# Patient Record
Sex: Female | Born: 1975
Health system: Southern US, Community
[De-identification: ages and names within clinical notes are randomized; demographics above are authoritative.]

## PROBLEM LIST (undated history)

## (undated) DIAGNOSIS — G43909 Migraine, unspecified, not intractable, without status migrainosus: Secondary | ICD-10-CM

## (undated) DIAGNOSIS — E162 Hypoglycemia, unspecified: Secondary | ICD-10-CM

## (undated) DIAGNOSIS — F32A Depression, unspecified: Secondary | ICD-10-CM

## (undated) DIAGNOSIS — F329 Major depressive disorder, single episode, unspecified: Secondary | ICD-10-CM

## (undated) DIAGNOSIS — F419 Anxiety disorder, unspecified: Secondary | ICD-10-CM

## (undated) DIAGNOSIS — E119 Type 2 diabetes mellitus without complications: Secondary | ICD-10-CM

## (undated) DIAGNOSIS — M5126 Other intervertebral disc displacement, lumbar region: Secondary | ICD-10-CM

## (undated) DIAGNOSIS — M259 Joint disorder, unspecified: Secondary | ICD-10-CM

## (undated) DIAGNOSIS — M5136 Other intervertebral disc degeneration, lumbar region: Secondary | ICD-10-CM

## (undated) DIAGNOSIS — R55 Syncope and collapse: Secondary | ICD-10-CM

## (undated) DIAGNOSIS — E282 Polycystic ovarian syndrome: Secondary | ICD-10-CM

## (undated) DIAGNOSIS — K219 Gastro-esophageal reflux disease without esophagitis: Secondary | ICD-10-CM

## (undated) DIAGNOSIS — M51369 Other intervertebral disc degeneration, lumbar region without mention of lumbar back pain or lower extremity pain: Secondary | ICD-10-CM

## (undated) HISTORY — DX: Joint disorder, unspecified: M25.9

## (undated) HISTORY — DX: Polycystic ovarian syndrome: E28.2

## (undated) HISTORY — DX: Depression, unspecified: F32.A

## (undated) HISTORY — DX: Type 2 diabetes mellitus without complications: E11.9

## (undated) HISTORY — DX: Syncope and collapse: R55

## (undated) HISTORY — DX: Other intervertebral disc degeneration, lumbar region without mention of lumbar back pain or lower extremity pain: M51.369

## (undated) HISTORY — DX: Gastro-esophageal reflux disease without esophagitis: K21.9

## (undated) HISTORY — DX: Hypoglycemia, unspecified: E16.2

## (undated) HISTORY — DX: Migraine, unspecified, not intractable, without status migrainosus: G43.909

## (undated) HISTORY — DX: Major depressive disorder, single episode, unspecified: F32.9

## (undated) HISTORY — DX: Other intervertebral disc degeneration, lumbar region: M51.36

## (undated) HISTORY — PX: DENTAL SURGERY: SHX609

## (undated) HISTORY — PX: OTHER SURGICAL HISTORY: SHX169

## (undated) HISTORY — DX: Other intervertebral disc displacement, lumbar region: M51.26

---

## 1998-05-26 ENCOUNTER — Emergency Department (HOSPITAL_COMMUNITY): Admission: EM | Admit: 1998-05-26 | Discharge: 1998-05-26 | Payer: Self-pay | Admitting: Emergency Medicine

## 1998-06-13 ENCOUNTER — Inpatient Hospital Stay (HOSPITAL_COMMUNITY): Admission: EM | Admit: 1998-06-13 | Discharge: 1998-06-15 | Payer: Self-pay | Admitting: Emergency Medicine

## 1998-06-13 ENCOUNTER — Encounter: Payer: Self-pay | Admitting: *Deleted

## 1998-08-23 ENCOUNTER — Emergency Department (HOSPITAL_COMMUNITY): Admission: EM | Admit: 1998-08-23 | Discharge: 1998-08-23 | Payer: Self-pay | Admitting: Emergency Medicine

## 1998-08-23 ENCOUNTER — Inpatient Hospital Stay (HOSPITAL_COMMUNITY): Admission: AD | Admit: 1998-08-23 | Discharge: 1998-08-23 | Payer: Self-pay | Admitting: Obstetrics

## 2000-06-25 ENCOUNTER — Encounter: Payer: Self-pay | Admitting: Emergency Medicine

## 2000-06-25 ENCOUNTER — Emergency Department (HOSPITAL_COMMUNITY): Admission: EM | Admit: 2000-06-25 | Discharge: 2000-06-25 | Payer: Self-pay | Admitting: Emergency Medicine

## 2000-08-04 ENCOUNTER — Other Ambulatory Visit: Admission: RE | Admit: 2000-08-04 | Discharge: 2000-08-04 | Payer: Self-pay | Admitting: Obstetrics and Gynecology

## 2004-09-12 ENCOUNTER — Emergency Department (HOSPITAL_COMMUNITY): Admission: EM | Admit: 2004-09-12 | Discharge: 2004-09-13 | Payer: Self-pay

## 2004-12-19 ENCOUNTER — Emergency Department (HOSPITAL_COMMUNITY): Admission: EM | Admit: 2004-12-19 | Discharge: 2004-12-19 | Payer: Self-pay | Admitting: Emergency Medicine

## 2008-12-15 ENCOUNTER — Emergency Department (HOSPITAL_COMMUNITY): Admission: EM | Admit: 2008-12-15 | Discharge: 2008-12-16 | Payer: Self-pay | Admitting: Emergency Medicine

## 2009-04-02 ENCOUNTER — Emergency Department (HOSPITAL_COMMUNITY): Admission: EM | Admit: 2009-04-02 | Discharge: 2009-04-02 | Payer: Self-pay | Admitting: Emergency Medicine

## 2010-02-13 ENCOUNTER — Emergency Department (HOSPITAL_COMMUNITY): Admission: EM | Admit: 2010-02-13 | Discharge: 2010-02-13 | Payer: Self-pay | Admitting: Emergency Medicine

## 2010-05-03 ENCOUNTER — Emergency Department (HOSPITAL_COMMUNITY): Admission: EM | Admit: 2010-05-03 | Discharge: 2010-05-03 | Payer: Self-pay | Admitting: Emergency Medicine

## 2010-12-03 LAB — BASIC METABOLIC PANEL
CO2: 25 mEq/L (ref 19–32)
Calcium: 8.5 mg/dL (ref 8.4–10.5)
Chloride: 101 mEq/L (ref 96–112)
GFR calc Af Amer: >60 mL/min (ref >60)
Glucose, Bld: 111 mg/dL — ABNORMAL HIGH (ref 70–99)
Potassium: 3.7 mEq/L (ref 3.5–5.1)
Sodium: 131 mEq/L — ABNORMAL LOW (ref 135–145)

## 2010-12-03 LAB — DIFFERENTIAL
Basophils Absolute: 0.1 10*3/uL (ref 0.0–0.1)
Basophils Relative: 1 % (ref 0–1)
Eosinophils Absolute: 0.2 10*3/uL (ref 0.0–0.7)
Eosinophils Relative: 2 % (ref 0–5)
Monocytes Absolute: 1 10*3/uL (ref 0.1–1.0)
Monocytes Relative: 8 % (ref 3–12)
Neutro Abs: 8.1 10*3/uL — ABNORMAL HIGH (ref 1.7–7.7)

## 2010-12-03 LAB — POCT I-STAT, CHEM 8
Calcium, Ion: 1.15 mmol/L (ref 1.12–1.32)
Chloride: 105 mEq/L (ref 96–112)
Glucose, Bld: 103 mg/dL — ABNORMAL HIGH (ref 70–99)
HCT: 39 % (ref 36.0–46.0)
TCO2: 24 mmol/L (ref 0–100)

## 2010-12-03 LAB — CBC
HCT: 35.8 % — ABNORMAL LOW (ref 36.0–46.0)
Hemoglobin: 12.3 g/dL (ref 12.0–15.0)
MCHC: 34.3 g/dL (ref 30.0–36.0)
MCV: 85.6 fL (ref 78.0–100.0)
RBC: 4.18 MIL/uL (ref 3.87–5.11)
RDW: 13.3 % (ref 11.5–15.5)

## 2010-12-26 LAB — POCT CARDIAC MARKERS
CKMB, poc: <1 ng/mL — ABNORMAL LOW (ref 1.0–8.0)
Myoglobin, poc: 56.6 ng/mL (ref 12–200)

## 2010-12-26 LAB — BASIC METABOLIC PANEL
BUN: 8 mg/dL (ref 6–23)
Calcium: 8.7 mg/dL (ref 8.4–10.5)
Chloride: 104 mEq/L (ref 96–112)
Creatinine, Ser: 0.83 mg/dL (ref 0.4–1.2)
GFR calc Af Amer: >60 mL/min (ref >60)

## 2010-12-26 LAB — DIFFERENTIAL
Basophils Relative: 1 % (ref 0–1)
Eosinophils Absolute: 0.1 10*3/uL (ref 0.0–0.7)
Neutro Abs: 10.4 10*3/uL — ABNORMAL HIGH (ref 1.7–7.7)
Neutrophils Relative %: 75 % (ref 43–77)

## 2010-12-26 LAB — CBC
MCHC: 34.7 g/dL (ref 30.0–36.0)
MCV: 83.8 fL (ref 78.0–100.0)
Platelets: 328 10*3/uL (ref 150–400)
WBC: 13.8 10*3/uL — ABNORMAL HIGH (ref 4.0–10.5)

## 2011-01-17 ENCOUNTER — Emergency Department (HOSPITAL_COMMUNITY)
Admission: EM | Admit: 2011-01-17 | Discharge: 2011-01-18 | Discharge status: Against Medical Advice | Payer: BC Managed Care – PPO | Attending: Emergency Medicine | Admitting: Emergency Medicine

## 2011-01-17 DIAGNOSIS — R5383 Other fatigue: Secondary | ICD-10-CM | POA: Insufficient documentation

## 2011-01-17 DIAGNOSIS — R5381 Other malaise: Secondary | ICD-10-CM | POA: Insufficient documentation

## 2011-02-17 ENCOUNTER — Emergency Department (HOSPITAL_COMMUNITY)
Admission: EM | Admit: 2011-02-17 | Discharge: 2011-02-17 | Disposition: A | Discharge status: Home / Self Care | Payer: BC Managed Care – PPO | Attending: Emergency Medicine | Admitting: Emergency Medicine

## 2011-02-17 DIAGNOSIS — R0789 Other chest pain: Secondary | ICD-10-CM | POA: Insufficient documentation

## 2011-02-17 DIAGNOSIS — R05 Cough: Secondary | ICD-10-CM | POA: Insufficient documentation

## 2011-02-17 DIAGNOSIS — R0609 Other forms of dyspnea: Secondary | ICD-10-CM | POA: Insufficient documentation

## 2011-02-17 DIAGNOSIS — R059 Cough, unspecified: Secondary | ICD-10-CM | POA: Insufficient documentation

## 2011-02-17 DIAGNOSIS — R0602 Shortness of breath: Secondary | ICD-10-CM | POA: Insufficient documentation

## 2011-02-17 DIAGNOSIS — R0989 Other specified symptoms and signs involving the circulatory and respiratory systems: Secondary | ICD-10-CM | POA: Insufficient documentation

## 2011-02-17 DIAGNOSIS — J45909 Unspecified asthma, uncomplicated: Secondary | ICD-10-CM | POA: Insufficient documentation

## 2011-02-17 DIAGNOSIS — R0682 Tachypnea, not elsewhere classified: Secondary | ICD-10-CM | POA: Insufficient documentation

## 2011-03-28 ENCOUNTER — Ambulatory Visit: Payer: BC Managed Care – PPO | Admitting: Family Medicine

## 2011-04-11 ENCOUNTER — Encounter: Payer: Self-pay | Admitting: Family Medicine

## 2011-04-11 ENCOUNTER — Ambulatory Visit (INDEPENDENT_AMBULATORY_CARE_PROVIDER_SITE_OTHER): Payer: BC Managed Care – PPO | Admitting: Family Medicine

## 2011-04-11 DIAGNOSIS — E282 Polycystic ovarian syndrome: Secondary | ICD-10-CM | POA: Insufficient documentation

## 2011-04-11 DIAGNOSIS — G43909 Migraine, unspecified, not intractable, without status migrainosus: Secondary | ICD-10-CM | POA: Insufficient documentation

## 2011-04-11 DIAGNOSIS — R319 Hematuria, unspecified: Secondary | ICD-10-CM

## 2011-04-11 LAB — POCT URINALYSIS DIPSTICK
Bilirubin, UA: NEGATIVE
Nitrite, UA: NEGATIVE
pH, UA: 7

## 2011-04-11 NOTE — Assessment & Plan Note (Signed)
con't meds Refer to Neurology---pt will need to get records from previous neuro

## 2011-04-11 NOTE — Assessment & Plan Note (Signed)
Check labs  Check US  

## 2011-04-11 NOTE — Progress Notes (Signed)
  Subjective:    Patient ID: Stacy Gentry, female    DOB: September 11, 1976, 35 y.o.   MRN: [id number]  HPI pt here to establish.  She needs a referral to an endo secondary to headaches believed to be hormonal per neuro [place].  Pt was also dx with PCOS by gyn but has received no tx yet.      Review of Systems as above   Objective:   Physical Exam  Constitutional: She is oriented to person, place, and time. She appears well-developed and well-nourished.  HENT:  Head: Normocephalic and atraumatic.  Right Ear: External ear normal.  Left Ear: External ear normal.  Nose: Nose normal.  Mouth/Throat: Oropharynx is clear and moist. No oropharyngeal exudate.  Neck: Normal range of motion. Neck supple.  Cardiovascular: Normal rate and regular rhythm.   Murmur heard. Musculoskeletal: Normal range of motion.  Lymphadenopathy:    She has no cervical adenopathy.  Neurological: She is alert and oriented to person, place, and time.  Psychiatric: She has a normal mood and affect. Her behavior is normal. Judgment and thought content normal.          Assessment & Plan:

## 2011-04-12 LAB — HEPATIC FUNCTION PANEL
AST: 24 U/L (ref 0–37)
Albumin: 4.3 g/dL (ref 3.5–5.2)

## 2011-04-12 LAB — CBC WITH DIFFERENTIAL/PLATELET
Basophils Absolute: 0 10*3/uL (ref 0.0–0.1)
Eosinophils Absolute: 0.2 10*3/uL (ref 0.0–0.7)
Eosinophils Relative: 3.1 % (ref 0.0–5.0)
HCT: 36.1 % (ref 36.0–46.0)
Lymphs Abs: 2.7 10*3/uL (ref 0.7–4.0)
MCHC: 33.2 g/dL (ref 30.0–36.0)
MCV: 84.5 fl (ref 78.0–100.0)
Monocytes Absolute: 0.7 10*3/uL (ref 0.1–1.0)
Platelets: 350 10*3/uL (ref 150.0–400.0)
RDW: 13.5 % (ref 11.5–14.6)

## 2011-04-12 LAB — BASIC METABOLIC PANEL
BUN: 6 mg/dL (ref 6–23)
CO2: 26 mEq/L (ref 19–32)
Chloride: 103 mEq/L (ref 96–112)
Glucose, Bld: 138 mg/dL — ABNORMAL HIGH (ref 70–99)
Potassium: 3.9 mEq/L (ref 3.5–5.1)

## 2011-04-12 LAB — TESTOSTERONE, FREE, TOTAL, SHBG: Testosterone: 60.51 ng/dL (ref 10–70)

## 2011-04-12 LAB — HEMOGLOBIN A1C: Hgb A1c MFr Bld: 5.8 % (ref 4.6–6.5)

## 2011-04-15 LAB — URINE CULTURE: Colony Count: >=100000

## 2011-04-17 ENCOUNTER — Telehealth: Payer: Self-pay | Admitting: *Deleted

## 2011-04-17 MED ORDER — CIPROFLOXACIN HCL 500 MG PO TABS: 500.0000 mg | ORAL_TABLET | Freq: Two times a day (BID) | ORAL | Status: AC

## 2011-04-17 NOTE — Telephone Encounter (Addendum)
Discuss with patient, Rx sent in to pharmacy.   Message copied by Verdene Rio on Wed Apr 17, 2011  4:19 PM ------      Message from: Lelon Perla      Created: Tue Apr 16, 2011 10:07 AM       + UTI---- cipro 500 mg 1 po bid for 5 days

## 2011-04-18 ENCOUNTER — Other Ambulatory Visit (HOSPITAL_COMMUNITY): Payer: BC Managed Care – PPO

## 2011-04-19 ENCOUNTER — Other Ambulatory Visit (HOSPITAL_COMMUNITY): Payer: BC Managed Care – PPO

## 2011-04-19 ENCOUNTER — Other Ambulatory Visit: Payer: Self-pay | Admitting: Family Medicine

## 2011-04-19 ENCOUNTER — Ambulatory Visit (HOSPITAL_COMMUNITY)
Admission: RE | Admit: 2011-04-19 | Discharge: 2011-04-19 | Disposition: A | Discharge status: Home / Self Care | Payer: BC Managed Care – PPO | Source: Ambulatory Visit | Attending: Family Medicine | Admitting: Family Medicine

## 2011-04-19 ENCOUNTER — Telehealth: Payer: Self-pay

## 2011-04-19 DIAGNOSIS — E282 Polycystic ovarian syndrome: Secondary | ICD-10-CM

## 2011-04-19 DIAGNOSIS — D219 Benign neoplasm of connective and other soft tissue, unspecified: Secondary | ICD-10-CM

## 2011-04-19 NOTE — Telephone Encounter (Signed)
Irregular periods with fibroid Refer to gyn     Discussed with patient and she voiced understanding.    Ref put in        [place]

## 2011-04-25 ENCOUNTER — Ambulatory Visit (INDEPENDENT_AMBULATORY_CARE_PROVIDER_SITE_OTHER): Payer: BC Managed Care – PPO | Admitting: Gynecology

## 2011-04-25 ENCOUNTER — Other Ambulatory Visit (HOSPITAL_COMMUNITY)
Admission: RE | Admit: 2011-04-25 | Discharge: 2011-04-25 | Disposition: A | Discharge status: Home / Self Care | Payer: BC Managed Care – PPO | Source: Ambulatory Visit | Attending: Gynecology | Admitting: Gynecology

## 2011-04-25 ENCOUNTER — Encounter: Payer: Self-pay | Admitting: Gynecology

## 2011-04-25 VITALS — BP 124/78 | Ht 63.5 in | Wt 239.0 lb

## 2011-04-25 DIAGNOSIS — Z01419 Encounter for gynecological examination (general) (routine) without abnormal findings: Secondary | ICD-10-CM | POA: Insufficient documentation

## 2011-04-25 DIAGNOSIS — N926 Irregular menstruation, unspecified: Secondary | ICD-10-CM

## 2011-04-25 DIAGNOSIS — E282 Polycystic ovarian syndrome: Secondary | ICD-10-CM

## 2011-04-25 DIAGNOSIS — D259 Leiomyoma of uterus, unspecified: Secondary | ICD-10-CM

## 2011-04-25 MED ORDER — NORETHIN-ETH ESTRAD-FE BIPHAS 1 MG-10 MCG / 10 MCG PO TABS: 1.0000 | ORAL_TABLET | Freq: Every day | ORAL | Status: DC

## 2011-04-25 NOTE — Progress Notes (Addendum)
Stacy Gentry 1976/06/09 [id number]        35 y.o.  Presents in consultation from Dr. Loreen Freud in reference to irregular menses, leiomyoma, PCOS history. She normally sees Dr. Laury Axon for routine health maintenance and recently had blood studies that showed a mildly elevated glucose but normal hemoglobin A1c, testosterone in the 60 range normal CBC and ultrasound which showed endometrial echo of 11 mm, normal right and left ovaries without description of polycystic changes and 2 small myomas in the 2-3 cm range.  Patient notes that her menses are normally once a month but they oscillate between heavy one month and liked the next month. No intermenstrual bleeding. She also complains of difficulty losing weight and hirsutism primarily on her chin. She has not sexually active.  She is actively being followed for migraines with aura.   Past medical history,surgical history, allergies, family history and social history were all reviewed and documented in the EPIC chart. ROS:  Was performed and pertinent positives and negatives are included in the history.  Exam: directed towards her complaint chaperone present Filed Vitals:   04/25/11 1055  BP: 124/78   General appearance  Normal Skin grossly normal with terminal hair growth along the chin and upper neck. No significant sideburns Head/Neck normal with no cervical or supraclavicular adenopathy thyroid normal Abdominal  soft, nontender, without masses, organomegaly or hernia Breasts  examined lying and sitting without masses, retractions, discharge or axillary adenopathy.  No chest hair growth Pelvic  Ext/BUS/vagina  normal mild menses staining  Cervix  normal  Pap done  Uterus  anteverted, normal size, shape and contour, midline and mobile nontender   Adnexa  Without masses or tenderness    Anus and perineum  normal   Rectovaginal  normal sphincter tone without palpated masses or tenderness.    Assessment/Plan:  35 y.o. female history  of PCOS by prior physician diagnosis based on her weight and some menstrual irregularity.  Recently saw Dr. Laury Axon had upper limits of normal range testosterone at 60.  Mildly elevated glucose but normal hemoglobin A1c. I did not see lipid profile. I discussed with patient the issues of PCOS, metabolic syndrome and the need to be following her lipid profile, glucose and blood pressure. The need to try to limit weight through exercise and diet was discussed with her possibility such as metformin was also reviewed. She does have regular menses timing but they're irregular in the flow rate from month to month. She is not interested in getting pregnant at present. She does have some chin hirsutism. Would check baseline labs in addition to the testosterone that was done at 17 hydroxyprogesterone as well as DHEAS and prolactin level. Options for management were reviewed and I think from a menstrual regulation in an androgen suppressive standpoint low-dose oral contraceptives would be her best choice. I did review with her her history of migraines with aura and the increased risk of stroke associated with birth control pill use as well as the other risks such as DVT heart attack risk. She does seem to get worse migraines with her menses. After lengthy discussion and her clear understanding of the risks she wants to go ahead and start low-dose oral contraceptives I think we'll start her on lo lo Estrin to minimize estrogen exposure as well as to extend the estrogen into her menses week and hopefully abort her menstrual migraines. She has she has an appointment with her neurologist the beginning of next week and she will discuss starting  the pills with them just to make sure they are comfortable with this. She is on her menses now so she will Sunday start her pills this week and I gave her 2 sample packs to start and I prescribed a year supply to her pharmacy. I asked her to call me in 2 months in followup to see how she's  doing on the pills and then otherwise Sumie she does well with this, she'll see me in a year.  We discussed her leiomyoma both of which are small subserosal and I do not think they're contributing to her symptoms. We will plan observation at present with no special followup unless she develops worsening menses or an abnormal followup exam.  Self breast exams on a monthly basis discussed encouraged. She does have a history of breast cancer in her mother in the mid 60s and I recommended that she get her baseline at age 48 to be next year and she agrees with this. I did a Pap smear today as she reports none done through Dr. Ernst Spell office.  No blood work was done other than the above labs she'll continue to see Dr. Laury Axon for routine health maintenance and again I discussed with the patient the need to follow her glucose her lipid profile blood pressure and attempt to lose weight as best she can.   Dara Lords MD, 11:29 AM 04/25/2011

## 2011-05-07 ENCOUNTER — Other Ambulatory Visit: Payer: Self-pay | Admitting: Gynecology

## 2011-05-07 ENCOUNTER — Telehealth: Payer: Self-pay | Admitting: *Deleted

## 2011-05-07 DIAGNOSIS — N926 Irregular menstruation, unspecified: Secondary | ICD-10-CM

## 2011-05-07 NOTE — Telephone Encounter (Signed)
Message copied by Aura Camps on Tue May 07, 2011  2:37 PM ------      Message from: Ether Griffins      Created: Tue May 07, 2011 11:22 AM      Regarding: CALL PT FOR LABS (FUTURE ORDERS)       [name],            Please call this patient back to come for labs. She never came by and Dr. Audie Box has requested these tests. The orders are already in the computer under future orders. Thanks!            Rose E.

## 2011-05-07 NOTE — Telephone Encounter (Signed)
SPOKE WITH PT AND SHE WILL CALL ONCE BACK IN TOWN. DIDN'T WANT TO MAKE APPOINTMENT NOW BECAUSE SHE NEED TO CHECK HER SCHEDULE.

## 2011-05-07 NOTE — Progress Notes (Signed)
Addended by: Landis Martins R on: 05/07/2011 11:26 AM   Modules accepted: Orders

## 2011-05-13 ENCOUNTER — Encounter: Payer: Self-pay | Admitting: Family Medicine

## 2011-05-13 ENCOUNTER — Ambulatory Visit (INDEPENDENT_AMBULATORY_CARE_PROVIDER_SITE_OTHER): Payer: BC Managed Care – PPO | Admitting: Family Medicine

## 2011-05-13 DIAGNOSIS — D219 Benign neoplasm of connective and other soft tissue, unspecified: Secondary | ICD-10-CM

## 2011-05-13 DIAGNOSIS — R0989 Other specified symptoms and signs involving the circulatory and respiratory systems: Secondary | ICD-10-CM

## 2011-05-13 DIAGNOSIS — R011 Cardiac murmur, unspecified: Secondary | ICD-10-CM

## 2011-05-13 DIAGNOSIS — G43909 Migraine, unspecified, not intractable, without status migrainosus: Secondary | ICD-10-CM

## 2011-05-13 DIAGNOSIS — D259 Leiomyoma of uterus, unspecified: Secondary | ICD-10-CM

## 2011-05-13 NOTE — Progress Notes (Signed)
  Subjective:    Patient ID: Stacy Gentry, female    DOB: 1976/07/08, 35 y.o.   MRN: [id number]  HPI Pt here to f/u migraines.  She was started on topamax and is doing well.  Pt saw Dr Audie Box for fibroids and is being treated with bcp.   Pt is also getting a sleep study through neuro.  No other complaints.   Review of Systems as above   Objective:   Physical Exam  Constitutional: She is oriented to person, place, and time. She appears well-developed and well-nourished.  Cardiovascular: Normal rate and regular rhythm.   Murmur heard. Pulmonary/Chest: Breath sounds normal. No respiratory distress. She has no wheezes. She has no rales. She exhibits no tenderness.  Musculoskeletal: She exhibits no edema.  Neurological: She is alert and oriented to person, place, and time.  Psychiatric: She has a normal mood and affect. Her behavior is normal. Judgment and thought content normal.          Assessment & Plan:  1. Migraines---per neuro  2.  Fibroids-----pt does not have pcos---per gyn  3.  New murmur ---check echo rto 6 months or sooner prn

## 2011-05-17 ENCOUNTER — Other Ambulatory Visit (HOSPITAL_COMMUNITY): Payer: BC Managed Care – PPO | Admitting: Radiology

## 2011-05-27 ENCOUNTER — Other Ambulatory Visit (HOSPITAL_COMMUNITY): Payer: BC Managed Care – PPO | Admitting: Radiology

## 2011-05-31 ENCOUNTER — Telehealth: Payer: Self-pay

## 2011-05-31 NOTE — Telephone Encounter (Signed)
She has a new murmur---that is worrisome---no one has ever heard it before.   As long as she is not having any other symptoms we can wait some---but she needs to call and cancel , not just not show.

## 2011-05-31 NOTE — Telephone Encounter (Signed)
Detailed message left making patient aware of Dr.Lownes recommendations and I have advised patient to call back if she wants to discuss    KP

## 2011-05-31 NOTE — Telephone Encounter (Signed)
Dr.Lowne please see note below      KP

## 2011-05-31 NOTE — Telephone Encounter (Signed)
Left mssg to call the office       KP

## 2011-05-31 NOTE — Telephone Encounter (Signed)
Message copied by Arnette Norris on Fri May 31, 2011  9:42 AM ------      Message from: Pricilla Holm      Created: Fri May 31, 2011  9:23 AM      Regarding: echo        Pt now showed on 9/10 for echo  -lowne

## 2011-05-31 NOTE — Telephone Encounter (Signed)
Pt states that she contacted her insurance company [employer] and found out that she hasn't met her deductable and would be stuck with a $500+bill. Pt states she will just have to wait on this and doesn't wish to reschedule at this time.

## 2011-06-11 ENCOUNTER — Ambulatory Visit (HOSPITAL_COMMUNITY): Payer: BC Managed Care – PPO | Attending: Cardiology | Admitting: Radiology

## 2011-06-11 DIAGNOSIS — R002 Palpitations: Secondary | ICD-10-CM | POA: Insufficient documentation

## 2011-06-11 DIAGNOSIS — E669 Obesity, unspecified: Secondary | ICD-10-CM | POA: Insufficient documentation

## 2011-06-11 DIAGNOSIS — R011 Cardiac murmur, unspecified: Secondary | ICD-10-CM

## 2011-06-13 ENCOUNTER — Telehealth: Payer: Self-pay

## 2011-06-13 DIAGNOSIS — I5189 Other ill-defined heart diseases: Secondary | ICD-10-CM

## 2011-06-13 NOTE — Telephone Encounter (Signed)
Made patient aware of results and she requested the Cardiology ref.  --ref put in and copy mailed to patient     KP

## 2011-06-13 NOTE — Telephone Encounter (Signed)
Message copied by Arnette Norris on Thu Jun 13, 2011 10:49 AM ------      Message from: Lelon Perla      Created: Wed Jun 12, 2011  2:18 PM       Mild diastolic dysfunction      If palpitations con't --refer to cards

## 2011-06-26 ENCOUNTER — Telehealth: Payer: Self-pay | Admitting: Cardiology

## 2011-07-04 ENCOUNTER — Institutional Professional Consult (permissible substitution): Payer: BC Managed Care – PPO | Admitting: Cardiology

## 2011-07-24 ENCOUNTER — Encounter: Payer: Self-pay | Admitting: *Deleted

## 2011-07-25 ENCOUNTER — Encounter (INDEPENDENT_AMBULATORY_CARE_PROVIDER_SITE_OTHER): Payer: BC Managed Care – PPO

## 2011-07-25 ENCOUNTER — Ambulatory Visit (INDEPENDENT_AMBULATORY_CARE_PROVIDER_SITE_OTHER): Payer: BC Managed Care – PPO | Admitting: Cardiology

## 2011-07-25 ENCOUNTER — Encounter: Payer: Self-pay | Admitting: Cardiology

## 2011-07-25 DIAGNOSIS — I471 Supraventricular tachycardia: Secondary | ICD-10-CM

## 2011-07-25 DIAGNOSIS — R0600 Dyspnea, unspecified: Secondary | ICD-10-CM

## 2011-07-25 DIAGNOSIS — R000 Tachycardia, unspecified: Secondary | ICD-10-CM | POA: Insufficient documentation

## 2011-07-25 DIAGNOSIS — I10 Essential (primary) hypertension: Secondary | ICD-10-CM | POA: Insufficient documentation

## 2011-07-25 DIAGNOSIS — R0609 Other forms of dyspnea: Secondary | ICD-10-CM

## 2011-07-25 DIAGNOSIS — R0989 Other specified symptoms and signs involving the circulatory and respiratory systems: Secondary | ICD-10-CM

## 2011-07-25 DIAGNOSIS — E669 Obesity, unspecified: Secondary | ICD-10-CM

## 2011-07-25 NOTE — Assessment & Plan Note (Addendum)
BP Readings from Last 3 Encounters:  07/25/11 138/101  05/13/11 120/76  04/25/11 124/78   We will watch her BP as today's reading is the hightest.

## 2011-07-25 NOTE — Assessment & Plan Note (Signed)
The patient understands the need to lose weight with diet and exercise. We have discussed specific strategies for this.  

## 2011-07-25 NOTE — Assessment & Plan Note (Signed)
I do note that her last TSH in July was at the very lower limits of normal. I will repeat this with a T3-T4. I will place a 24-hour Holter monitor to make sure she has normal sleep/wake variation.  Further testing will be based on these results.

## 2011-07-25 NOTE — Assessment & Plan Note (Signed)
I will bring the patient back for a POET (Plain Old Exercise Test). This will allow me to screen for obstructive coronary disease, risk stratify and very importantly provide a prescription for exercise.  I will also check a BNP.

## 2011-07-25 NOTE — Patient Instructions (Signed)
Your physician has recommended that you wear a holter monitor. Holter monitors are medical devices that record the heart's electrical activity. Doctors most often use these monitors to diagnose arrhythmias. Arrhythmias are problems with the speed or rhythm of the heartbeat. The monitor is a small, portable device. You can wear one while you do your normal daily activities. This is usually used to diagnose what is causing palpitations/syncope (passing out).  This should be scheduled and completed within the next ten days.  Your physician has requested that you have an exercise tolerance test. For further information please visit [email or web address] Please also follow instruction sheet, as given.  Please have blood work done today.  TSH, T3 and T4  Continue all medications as listed.

## 2011-07-25 NOTE — Progress Notes (Signed)
HPI The patient presents for evaluation of tachycardia. She was recently noted to have this and was complaining of dyspnea. She also was found to have a heart murmur. Echocardiography was done in September which demonstrated well preserved ejection fraction with no significant valvular abnormalities. However, the patient continues to have dyspnea. This is slowly progressive. She lives on the second floor and finds that she has shortness of breath walking up a flight of stairs areas she gets into her apartment and has to rest. She sleeps on 3 pillows which has been chronic. She does have some throat tightness with this activity. She'll take an albuterol MDI and her symptoms will abate after 5 or 10 minutes. She did have a sleep study because she thought she didn't sleep well but this was negative. However, she was concerned because she didn't sleep during the test. She has had a 40 pound weight gain in about the last year or with increased intake and decreased exercise. She has a past history of hypoglycemia and syncope but no other cardiac symptoms.  Allergies  Allergen Reactions  . Hydrocodone Itching  . Oxycodone Itching    Current Outpatient Prescriptions  Medication Sig Dispense Refill  . albuterol (PROVENTIL HFA) 108 (90 BASE) MCG/ACT inhaler Inhale 2 puffs into the lungs every 6 (six) hours as needed.        . cetirizine (ZYRTEC) 10 MG tablet Take 10 mg by mouth daily.        . Norethindrone-Ethinyl Estradiol-Fe Biphas (LO LOESTRIN FE) 1 MG-10 MCG / 10 MCG tablet Take 1 tablet by mouth daily.  1 Package  11  . promethazine (PHENERGAN) 25 MG tablet every 6 (six) hours as needed.       . SUMAtriptan Succinate (IMITREX IJ) Inject 6 mg as directed 2 (two) times a week.        . topiramate (TOPAMAX) 25 MG tablet Take 75 mg by mouth daily.          Past Medical History  Diagnosis Date  . Asthma   . Depression   . Syncope   . Migraines     Dr.Paul Daphine Deutscher in [place]  . PCOS (polycystic ovarian  syndrome)     [facility]    No past surgical history on file.  Family History  Problem Relation Age of Onset  . Breast cancer Mother 10  . Breast cancer  41    Maternal Cousin  . Diabetes Mother   . Diabetes Maternal Grandmother   . Diabetes Maternal Aunt   . Arthritis Maternal Grandmother   . Arthritis Mother   . Hyperlipidemia Paternal Aunt   . Hyperlipidemia Paternal Uncle   . Heart disease Paternal Uncle   . Stroke Paternal Uncle   . Hypertension    . Kidney disease Paternal Uncle   . Depression      History   Social History  . Marital Status: Single    Spouse Name: N/A    Number of Children: N/A  . Years of Education: N/A   Occupational History  . Not on file.   Social History Main Topics  . Smoking status: Never Smoker   . Smokeless tobacco: Never Used  . Alcohol Use: No  . Drug Use: No  . Sexually Active: No   Other Topics Concern  . Not on file   Social History Narrative  . No narrative on file    ROS:  Positive for dizziness, headaches. Otherwise as stated in the history of  present illness negative for all other systems.  PHYSICAL EXAM BP 138/101  Pulse 130  Ht 5\' 4"  (1.626 m)  Wt 233 lb (105.688 kg)  BMI 39.99 kg/m2 GENERAL:  Well appearing HEENT:  Pupils equal round and reactive, fundi not visualized, oral mucosa unremarkable NECK:  No jugular venous distention, waveform within normal limits, carotid upstroke brisk and symmetric, no bruits, no thyromegaly LYMPHATICS:  No cervical, inguinal adenopathy LUNGS:  Clear to auscultation bilaterally BACK:  No CVA tenderness CHEST:  Unremarkable HEART:  PMI not displaced or sustained,S1 and S2 within normal limits, no S3, no S4, no clicks, no rubs, no murmurs ABD:  Flat, positive bowel sounds normal in frequency in pitch, no bruits, no rebound, no guarding, no midline pulsatile mass, no hepatomegaly, no splenomegaly, obese EXT:  2 plus pulses throughout, no edema, no cyanosis no  clubbing SKIN:  No rashes no nodules NEURO:  Cranial nerves II through XII grossly intact, motor grossly intact throughout PSYCH:  Cognitively intact, oriented to person place and time   EKG:  Sinus tachycardia, rate 130, no specific ST-T wave changes  ASSESSMENT AND PLAN

## 2011-07-26 LAB — T4, FREE: Free T4: 0.95 ng/dL (ref 0.60–1.60)

## 2011-08-06 ENCOUNTER — Ambulatory Visit (INDEPENDENT_AMBULATORY_CARE_PROVIDER_SITE_OTHER): Payer: BC Managed Care – PPO | Admitting: Cardiology

## 2011-08-06 ENCOUNTER — Encounter: Payer: BC Managed Care – PPO | Admitting: Cardiology

## 2011-08-06 DIAGNOSIS — R0600 Dyspnea, unspecified: Secondary | ICD-10-CM

## 2011-08-06 DIAGNOSIS — I10 Essential (primary) hypertension: Secondary | ICD-10-CM

## 2011-08-06 DIAGNOSIS — R000 Tachycardia, unspecified: Secondary | ICD-10-CM

## 2011-08-06 NOTE — Progress Notes (Signed)
Exercise Treadmill Test  Pre-Exercise Testing Evaluation Rhythm: normal sinus  Rate: 124   PR:  .11 QRS:  .07  QT:  .33 QTc: 47     Test  Exercise Tolerance Test Ordering MD: Angelina Sheriff, MD  Interpreting MD:  Angelina Sheriff, MD  Unique Test No: 1  Treadmill:  1  Indication for ETT: HTN  Contraindication to ETT: No   Stress Modality: exercise - treadmill  Cardiac Imaging Performed: non   Protocol: standard Bruce - maximal  Max BP:  139/65  Max MPHR (bpm):  185 85% MPR (bpm):  157  MPHR obtained (bpm):  171 % MPHR obtained:  92  Reached 85% MPHR (min:sec):  144 Total Exercise Time (min-sec):  6:03  Workload in METS:  7.0 Borg Scale: 15  Reason ETT Terminated:  desired heart rate attained    ST Segment Analysis At Rest: normal ST segments - no evidence of significant ST depression With Exercise: no evidence of significant ST depression  Other Information Arrhythmia:  No Angina during ETT:  absent (0) Quality of ETT:  non-diagnostic  ETT Interpretation:  normal - no evidence of ischemia by ST analysis  Comments: The patient had a moderate to poor exercise tolerance.  There was no chest pain.  There was an appropriate level of dyspnea.  There were no arrhythmias, a normal heart rate response and normal BP response.  There were no ischemic ST T wave changes and a normal heart rate recovery.   Recommendations: Negative adequate ETT.  No further testing is indicated.  Based on the above I gave the patient a prescription for exercise.  Work up for rapid heart rate has been negative (echo, labs).  Holter with normal sleep wake variation but an elevated resting heart rate.  BNP normal.  No further work up.

## 2011-08-30 ENCOUNTER — Encounter: Payer: BC Managed Care – PPO | Admitting: Cardiology

## 2011-09-28 ENCOUNTER — Emergency Department (HOSPITAL_COMMUNITY)
Admission: EM | Admit: 2011-09-28 | Discharge: 2011-09-28 | Disposition: A | Discharge status: Home / Self Care | Payer: BC Managed Care – PPO | Source: Home / Self Care | Attending: Family Medicine | Admitting: Family Medicine

## 2011-09-28 ENCOUNTER — Encounter (HOSPITAL_COMMUNITY): Payer: Self-pay | Admitting: *Deleted

## 2011-09-28 ENCOUNTER — Emergency Department (INDEPENDENT_AMBULATORY_CARE_PROVIDER_SITE_OTHER): Payer: BC Managed Care – PPO

## 2011-09-28 DIAGNOSIS — J069 Acute upper respiratory infection, unspecified: Secondary | ICD-10-CM

## 2011-09-28 DIAGNOSIS — J45909 Unspecified asthma, uncomplicated: Secondary | ICD-10-CM

## 2011-09-28 MED ORDER — IPRATROPIUM BROMIDE 0.06 % NA SOLN: 2.0000 | Freq: Four times a day (QID) | NASAL | Status: DC

## 2011-09-28 MED ORDER — IPRATROPIUM BROMIDE 0.02 % IN SOLN
0.5000 mg | Freq: Once | RESPIRATORY_TRACT | Status: AC
  Administered 2011-09-28: 0.5 mg via RESPIRATORY_TRACT

## 2011-09-28 MED ORDER — METHYLPREDNISOLONE ACETATE 40 MG/ML IJ SUSP
80.0000 mg | Freq: Once | INTRAMUSCULAR | Status: AC
  Administered 2011-09-28: 80 mg via INTRAMUSCULAR

## 2011-09-28 MED ORDER — METHYLPREDNISOLONE ACETATE 80 MG/ML IJ SUSP
INTRAMUSCULAR | Status: AC
  Filled 2011-09-28: qty 1

## 2011-09-28 MED ORDER — ALBUTEROL SULFATE (5 MG/ML) 0.5% IN NEBU
5.0000 mg | INHALATION_SOLUTION | Freq: Once | RESPIRATORY_TRACT | Status: AC
  Administered 2011-09-28: 5 mg via RESPIRATORY_TRACT

## 2011-09-28 MED ORDER — ALBUTEROL SULFATE (5 MG/ML) 0.5% IN NEBU
INHALATION_SOLUTION | RESPIRATORY_TRACT | Status: AC
  Filled 2011-09-28: qty 1

## 2011-09-28 NOTE — ED Notes (Signed)
Per pt cough and congestion x 3 to 4 days chest sore from coughing went to [facility] yesterday given hydrocodone cough med (allergy to codeine) took x 3 now with itching all over - feels short of breath -and onset of headache

## 2011-09-28 NOTE — ED Provider Notes (Signed)
History     CSN: [id number]  Arrival date & time 09/28/11  1112   First MD Initiated Contact with Patient 09/28/11 1119      Chief Complaint  Patient presents with  . Cough  . Nasal Congestion  . Headache  . Pruritis  . Shortness of Breath    (Consider location/radiation/quality/duration/timing/severity/associated sxs/prior treatment) Patient is a 36 y.o. female presenting with cough, headaches, and shortness of breath. The history is provided by the patient.  Cough The current episode started more than 2 days ago (seen at [facility] yest., given cough med.). The problem has been gradually worsening. The cough is non-productive. There has been no fever. Associated symptoms include headaches, rhinorrhea, shortness of breath and wheezing. Pertinent negatives include no sore throat. She is not a smoker. Her past medical history is significant for asthma.  Headache The primary symptoms include headaches.  Shortness of Breath  Associated symptoms include rhinorrhea, cough, shortness of breath and wheezing. Pertinent negatives include no sore throat. Her past medical history is significant for asthma.    Past Medical History  Diagnosis Date  . Asthma   . Depression   . Syncope   . Migraines     Dr.Paul Daphine Deutscher in [place]  . PCOS (polycystic ovarian syndrome)     [name]    Past Surgical History  Procedure Date  . None     Family History  Problem Relation Age of Onset  . Breast cancer Mother 58  . Breast cancer  41    Maternal Cousin  . Diabetes Mother   . Diabetes Maternal Grandmother   . Diabetes Maternal Aunt   . Arthritis Maternal Grandmother   . Arthritis Mother   . Hyperlipidemia Paternal Aunt   . Hyperlipidemia Paternal Uncle   . Stroke Paternal Uncle   . Hypertension    . Kidney disease Paternal Uncle   . Depression    . Heart disease Paternal Uncle 65    Sudden death    History  Substance Use Topics  . Smoking status: Never Smoker   .  Smokeless tobacco: Never Used  . Alcohol Use: No    OB History    Grav Para Term Preterm Abortions TAB SAB Ect Mult Living   0               Review of Systems  Constitutional: Negative.   HENT: Positive for congestion and rhinorrhea. Negative for sore throat.   Respiratory: Positive for cough, shortness of breath and wheezing.   Neurological: Positive for headaches.    Allergies  Codeine; Hydrocodone; and Oxycodone  Home Medications   Current Outpatient Rx  Name Route Sig Dispense Refill  . ALBUTEROL SULFATE HFA 108 (90 BASE) MCG/ACT IN AERS Inhalation Inhale 2 puffs into the lungs every 6 (six) hours as needed.      . TOPIRAMATE 25 MG PO TABS Oral Take 75 mg by mouth daily.      Marland Kitchen CETIRIZINE HCL 10 MG PO TABS Oral Take 10 mg by mouth daily.      . IPRATROPIUM BROMIDE 0.06 % NA SOLN Nasal Place 2 sprays into the nose 4 (four) times daily. 15 mL 12  . NORETHIN-ETH ESTRAD-FE BIPHAS 1 MG-10 MCG / 10 MCG PO TABS Oral Take 1 tablet by mouth daily. 1 Package 11  . PROMETHAZINE HCL 25 MG PO TABS  every 6 (six) hours as needed.     Willette Brace IJ Injection Inject 6 mg as directed  2 (two) times a week.        BP 128/89  Pulse 120  Temp(Src) 99.3 F (37.4 C) (Oral)  Resp 22  SpO2 97%  LMP 09/02/2011  Physical Exam  Nursing note and vitals reviewed. Constitutional: She is oriented to person, place, and time. She appears well-developed and well-nourished.  HENT:  Head: Normocephalic.  Right Ear: External ear normal.  Left Ear: External ear normal.  Mouth/Throat: Oropharynx is clear and moist.  Eyes: Pupils are equal, round, and reactive to light.  Neck: Normal range of motion. Neck supple.  Cardiovascular: Normal rate, normal heart sounds and intact distal pulses.   Pulmonary/Chest: She has wheezes.  Lymphadenopathy:    She has no cervical adenopathy.  Neurological: She is alert and oriented to person, place, and time.  Skin: Skin is warm and dry.  Psychiatric: She has a  normal mood and affect.    ED Course  Procedures (including critical care time)  Labs Reviewed - No data to display Dg Chest 2 View  09/28/2011  *RADIOLOGY REPORT*  Clinical Data: Tightness in chest  CHEST - 2 VIEW  Comparison: 05/03/2010  Findings: The heart, mediastinum and hila are within normal limits. The lungs are clear.  No pleural effusion or pneumothorax.  The bony thorax and surrounding soft tissues are unremarkable.  IMPRESSION: Normal chest radiographs  Original Report Authenticated By: Domenic Moras, M.D.     1. Asthma, allergic   2. URI (upper respiratory infection)       MDM  X-rays reviewed and report per radiologist.  Sx improved with neb.        Barkley Bruns, MD 09/28/11 1259

## 2011-10-15 ENCOUNTER — Encounter: Payer: BC Managed Care – PPO | Admitting: Family Medicine

## 2011-10-28 ENCOUNTER — Ambulatory Visit (INDEPENDENT_AMBULATORY_CARE_PROVIDER_SITE_OTHER): Payer: BC Managed Care – PPO | Admitting: Family Medicine

## 2011-10-28 ENCOUNTER — Encounter: Payer: Self-pay | Admitting: Family Medicine

## 2011-10-28 VITALS — BP 112/72 | HR 100 | Temp 100.2°F | Wt 230.4 lb

## 2011-10-28 DIAGNOSIS — R059 Cough, unspecified: Secondary | ICD-10-CM

## 2011-10-28 DIAGNOSIS — J45901 Unspecified asthma with (acute) exacerbation: Secondary | ICD-10-CM

## 2011-10-28 DIAGNOSIS — J329 Chronic sinusitis, unspecified: Secondary | ICD-10-CM

## 2011-10-28 DIAGNOSIS — J4521 Mild intermittent asthma with (acute) exacerbation: Secondary | ICD-10-CM

## 2011-10-28 DIAGNOSIS — R05 Cough: Secondary | ICD-10-CM

## 2011-10-28 MED ORDER — PREDNISONE 10 MG PO TABS: ORAL_TABLET | ORAL | Status: DC

## 2011-10-28 MED ORDER — METHYLPREDNISOLONE ACETATE PF 80 MG/ML IJ SUSP
80.0000 mg | Freq: Once | INTRAMUSCULAR | Status: AC
  Administered 2011-10-28: 80 mg via INTRAMUSCULAR

## 2011-10-28 MED ORDER — MOMETASONE FUROATE 50 MCG/ACT NA SUSP: 2.0000 | Freq: Every day | NASAL | Status: DC

## 2011-10-28 MED ORDER — ALBUTEROL SULFATE HFA 108 (90 BASE) MCG/ACT IN AERS: 2.0000 | INHALATION_SPRAY | Freq: Four times a day (QID) | RESPIRATORY_TRACT | Status: DC | PRN

## 2011-10-28 MED ORDER — ALBUTEROL SULFATE (5 MG/ML) 0.5% IN NEBU
2.5000 mg | INHALATION_SOLUTION | Freq: Once | RESPIRATORY_TRACT | Status: AC
  Administered 2011-10-28: 2.5 mg via RESPIRATORY_TRACT

## 2011-10-28 MED ORDER — AMOXICILLIN-POT CLAVULANATE 875-125 MG PO TABS: 1.0000 | ORAL_TABLET | Freq: Two times a day (BID) | ORAL | Status: AC

## 2011-10-28 MED ORDER — BECLOMETHASONE DIPROPIONATE 40 MCG/ACT IN AERS: 2.0000 | INHALATION_SPRAY | Freq: Two times a day (BID) | RESPIRATORY_TRACT | Status: DC

## 2011-10-28 NOTE — Progress Notes (Signed)
  Subjective:     Stacy Gentry is a 36 y.o. female who presents for evaluation of asthma. The patient has not been previously diagnosed with asthma. The patient is currently having symptoms / an exacerbation. Current symptoms include chest tightness, dyspnea, productive cough and wheezing. Symptoms have been present since 2 days ago and have been gradually worsening. She denies chest pain and non-productive cough. Associated symptoms include fatigue, shortness of breath and wheezing.  This episode appears to have been triggered by upper respiratory infection. Treatments tried for the current exacerbation include short-acting inhaled beta-adrenergic agonists, which have provided some relief of symptoms. The patient has been having similar episodes for approximately 5 years.  Current Disease Severity Stacy Gentry has daily symptoms the last 2 days. She has frequent nighttime asthma symptoms. The patient is using short-acting beta agonists for symptom control several times per day. She has exacerbations requiring oral systemic corticosteroids 0 times per year. Current limitations in activity from asthma: last 2 days she has not been able to do anything. Number of days of school or work missed in the last month: 0. Number of urgent/emergent visit in last year: 3   The following portions of the patient's history were reviewed and updated as appropriate: allergies, current medications, past family history, past medical history, past social history, past surgical history and problem list.  Review of Systems Pertinent items are noted in HPI.    Objective:    Oxygen saturation 98% on room air BP 112/72  Pulse 100  Temp(Src) 100.2 F (37.9 C) (Oral)  Wt 230 lb 6.4 oz (104.509 kg)  SpO2 98% General appearance: alert, cooperative, appears stated age and mild distress Ears: normal TM's and external ear canals both ears Nose: green discharge, moderate congestion, sinus tenderness bilateral Throat: lips,  mucosa, and tongue normal; teeth and gums normal Neck: mild anterior cervical adenopathy, supple, symmetrical, trachea midline and thyroid not enlarged, symmetric, no tenderness/mass/nodules Lungs: diminished breath sounds bilaterally and wheezes bilaterally Heart: S1, S2 normal Lymph nodes: Cervical adenopathy: b/l    Assessment:    Moderate persistent asthma. Apparent precipitants include upper respiratory infection. Treatment with albuterol was given in the office     Plan:    Medications: continue albuterol and begin qvar. Beta-agonist nebulizer treatment given in the office with some relief of symptoms. Discussed distinction between quick-relief and controlled medications. Discussed medication dosage, use, side effects, and goals of treatment in detail.   Warning signs of respiratory distress were reviewed with the patient.  Asthma information handout given.  augmentin Prednisone taper Depo Wynelle Link

## 2011-10-28 NOTE — Progress Notes (Signed)
Addended by: Arnette Norris on: 10/28/2011 02:49 PM   Modules accepted: Orders

## 2011-10-28 NOTE — Patient Instructions (Signed)
Asthma Attack Prevention HOW CAN ASTHMA BE PREVENTED? Currently, there is no way to prevent asthma from starting. However, you can take steps to control the disease and prevent its symptoms after you have been diagnosed. Learn about your asthma and how to control it. Take an active role to control your asthma by working with your caregiver to create and follow an asthma action plan. An asthma action plan guides you in taking your medicines properly, avoiding factors that make your asthma worse, tracking your level of asthma control, responding to worsening asthma, and seeking emergency care when needed. To track your asthma, keep records of your symptoms, check your peak flow number using a peak flow meter (handheld device that shows how well air moves out of your lungs), and get regular asthma checkups.  Other ways to prevent asthma attacks include:  Use medicines as your caregiver directs.   Identify and avoid things that make your asthma worse (as much as you can).   Keep track of your asthma symptoms and level of control.   Get regular checkups for your asthma.   With your caregiver, write a detailed plan for taking medicines and managing an asthma attack. Then be sure to follow your action plan. Asthma is an ongoing condition that needs regular monitoring and treatment.   Identify and avoid asthma triggers. A number of outdoor allergens and irritants (pollen, mold, cold air, air pollution) can trigger asthma attacks. Find out what causes or makes your asthma worse, and take steps to avoid those triggers (see below).   Monitor your breathing. Learn to recognize warning signs of an attack, such as slight coughing, wheezing or shortness of breath. However, your lung function may already decrease before you notice any signs or symptoms, so regularly measure and record your peak airflow with a home peak flow meter.   Identify and treat attacks early. If you act quickly, you're less likely to have  a severe attack. You will also need less medicine to control your symptoms. When your peak flow measurements decrease and alert you to an upcoming attack, take your medicine as instructed, and immediately stop any activity that may have triggered the attack. If your symptoms do not improve, get medical help.   Pay attention to increasing quick-relief inhaler use. If you find yourself relying on your quick-relief inhaler (such as albuterol), your asthma is not under control. See your caregiver about adjusting your treatment.  IDENTIFY AND CONTROL FACTORS THAT MAKE YOUR ASTHMA WORSE A number of common things can set off or make your asthma symptoms worse (asthma triggers). Keep track of your asthma symptoms for several weeks, detailing all the environmental and emotional factors that are linked with your asthma. When you have an asthma attack, go back to your asthma diary to see which factor, or combination of factors, might have contributed to it. Once you know what these factors are, you can take steps to control many of them.  Allergies: If you have allergies and asthma, it is important to take asthma prevention steps at home. Asthma attacks (worsening of asthma symptoms) can be triggered by allergies, which can cause temporary increased inflammation of your airways. Minimizing contact with the substance to which you are allergic will help prevent an asthma attack. Animal Dander:   Some people are allergic to the flakes of skin or dried saliva from animals with fur or feathers. Keep these pets out of your home.   If you can't keep a pet outdoors, keep the   pet out of your bedroom and other sleeping areas at all times, and keep the door closed.   Remove carpets and furniture covered with cloth from your home. If that is not possible, keep the pet away from fabric-covered furniture and carpets.  Dust Mites:  Many people with asthma are allergic to dust mites. Dust mites are tiny bugs that are found in  every home, in mattresses, pillows, carpets, fabric-covered furniture, bedcovers, clothes, stuffed toys, fabric, and other fabric-covered items.   Cover your mattress in a special dust-proof cover.   Cover your pillow in a special dust-proof cover, or wash the pillow each week in hot water. Water must be hotter than 130 F to kill dust mites. Cold or warm water used with detergent and bleach can also be effective.   Wash the sheets and blankets on your bed each week in hot water.   Try not to sleep or lie on cloth-covered cushions.   Call ahead when traveling and ask for a smoke-free hotel room. Bring your own bedding and pillows, in case the hotel only supplies feather pillows and down comforters, which may contain dust mites and cause asthma symptoms.   Remove carpets from your bedroom and those laid on concrete, if you can.   Keep stuffed toys out of the bed, or wash the toys weekly in hot water or cooler water with detergent and bleach.  Cockroaches:  Many people with asthma are allergic to the droppings and remains of cockroaches.   Keep food and garbage in closed containers. Never leave food out.   Use poison baits, traps, powders, gels, or paste (for example, boric acid).   If a spray is used to kill cockroaches, stay out of the room until the odor goes away.  Indoor Mold:  Fix leaky faucets, pipes, or other sources of water that have mold around them.   Clean moldy surfaces with a cleaner that has bleach in it.  Pollen and Outdoor Mold:  When pollen or mold spore counts are high, try to keep your windows closed.   Stay indoors with windows closed from late morning to afternoon, if you can. Pollen and some mold spore counts are highest at that time.   Ask your caregiver whether you need to take or increase anti-inflammatory medicine before your allergy season starts.  Irritants:   Tobacco smoke is an irritant. If you smoke, ask your caregiver how you can quit. Ask family  members to quit smoking, too. Do not allow smoking in your home or car.   If possible, do not use a wood-burning stove, kerosene heater, or fireplace. Minimize exposure to all sources of smoke, including incense, candles, fires, and fireworks.   Try to stay away from strong odors and sprays, such as perfume, talcum powder, hair spray, and paints.   Decrease humidity in your home and use an indoor air cleaning device. Reduce indoor humidity to below 60 percent. Dehumidifiers or central air conditioners can do this.   Try to have someone else vacuum for you once or twice a week, if you can. Stay out of rooms while they are being vacuumed and for a short while afterward.   If you vacuum, use a dust mask from a hardware store, a double-layered or microfilter vacuum cleaner bag, or a vacuum cleaner with a HEPA filter.   Sulfites in foods and beverages can be irritants. Do not drink beer or wine, or eat dried fruit, processed potatoes, or shrimp if they cause asthma   symptoms.   Cold air can trigger an asthma attack. Cover your nose and mouth with a scarf on cold or windy days.   Several health conditions can make asthma more difficult to manage, including runny nose, sinus infections, reflux disease, psychological stress, and sleep apnea. Your caregiver will treat these conditions, as well.   Avoid close contact with people who have a cold or the flu, since your asthma symptoms may get worse if you catch the infection from them. Wash your hands thoroughly after touching items that may have been handled by people with a respiratory infection.   Get a flu shot every year to protect against the flu virus, which often makes asthma worse for days or weeks. Also get a pneumonia shot once every five to 10 years.  Drugs:  Aspirin and other painkillers can cause asthma attacks. 10% to 20% of people with asthma have sensitivity to aspirin or a group of painkillers called non-steroidal anti-inflammatory drugs  (NSAIDS), such as ibuprofen and naproxen. These drugs are used to treat pain and reduce fevers. Asthma attacks caused by any of these medicines can be severe and even fatal. These drugs must be avoided in people who have known aspirin sensitive asthma. Products with acetaminophen are considered safe for people who have asthma. It is important that people with aspirin sensitivity read labels of all over-the-counter drugs used to treat pain, colds, coughs, and fever.   Beta blockers and ACE inhibitors are other drugs which you should discuss with your caregiver, in relation to your asthma.  ALLERGY SKIN TESTING  Ask your asthma caregiver about allergy skin testing or blood testing (RAST test) to identify the allergens to which you are sensitive. If you are found to have allergies, allergy shots (immunotherapy) for asthma may help prevent future allergies and asthma. With allergy shots, small doses of allergens (substances to which you are allergic) are injected under your skin on a regular schedule. Over a period of time, your body may become used to the allergen and less responsive with asthma symptoms. You can also take measures to minimize your exposure to those allergens. EXERCISE  If you have exercise-induced asthma, or are planning vigorous exercise, or exercise in cold, humid, or dry environments, prevent exercise-induced asthma by following your caregiver's advice regarding asthma treatment before exercising. Document Released: 08/21/2009 Document Revised: 05/15/2011 Document Reviewed: 08/21/2009 [employer] Patient Information 2012 [employer]. 

## 2011-11-22 ENCOUNTER — Encounter: Payer: Self-pay | Admitting: Family Medicine

## 2011-11-22 ENCOUNTER — Ambulatory Visit (INDEPENDENT_AMBULATORY_CARE_PROVIDER_SITE_OTHER): Payer: BC Managed Care – PPO | Admitting: Family Medicine

## 2011-11-22 VITALS — BP 114/72 | HR 84 | Temp 99.2°F | Wt 228.6 lb

## 2011-11-22 DIAGNOSIS — J45901 Unspecified asthma with (acute) exacerbation: Secondary | ICD-10-CM

## 2011-11-22 DIAGNOSIS — J45909 Unspecified asthma, uncomplicated: Secondary | ICD-10-CM

## 2011-11-22 DIAGNOSIS — J329 Chronic sinusitis, unspecified: Secondary | ICD-10-CM

## 2011-11-22 DIAGNOSIS — J4521 Mild intermittent asthma with (acute) exacerbation: Secondary | ICD-10-CM

## 2011-11-22 MED ORDER — BECLOMETHASONE DIPROPIONATE 40 MCG/ACT IN AERS: 2.0000 | INHALATION_SPRAY | Freq: Two times a day (BID) | RESPIRATORY_TRACT | Status: DC

## 2011-11-22 MED ORDER — ALBUTEROL SULFATE HFA 108 (90 BASE) MCG/ACT IN AERS: 2.0000 | INHALATION_SPRAY | Freq: Four times a day (QID) | RESPIRATORY_TRACT | Status: DC | PRN

## 2011-11-22 NOTE — Progress Notes (Signed)
  Subjective:    Patient ID: Stacy Gentry, female    DOB: [birth year], 36 y.o.   MRN: [id number]  HPI  Pt here f/u asthma.  Pt with no complaints.  Rarely using ventolin and using her qvar bid.    Review of Systems As above    Objective:   Physical Exam  Constitutional: She is oriented to person, place, and time. She appears well-developed and well-nourished.  Cardiovascular: Normal rate and regular rhythm.   No murmur heard. Pulmonary/Chest: Effort normal and breath sounds normal. No respiratory distress. She has no wheezes. She has no rales. She exhibits no tenderness.  Neurological: She is alert and oriented to person, place, and time.  Psychiatric: She has a normal mood and affect. Her behavior is normal. Judgment and thought content normal.          Assessment & Plan:

## 2011-11-22 NOTE — Assessment & Plan Note (Signed)
con't qvar Ventolin prn pft normal

## 2011-11-22 NOTE — Patient Instructions (Signed)
Asthma Attack Prevention HOW CAN ASTHMA BE PREVENTED? Currently, there is no way to prevent asthma from starting. However, you can take steps to control the disease and prevent its symptoms after you have been diagnosed. Learn about your asthma and how to control it. Take an active role to control your asthma by working with your caregiver to create and follow an asthma action plan. An asthma action plan guides you in taking your medicines properly, avoiding factors that make your asthma worse, tracking your level of asthma control, responding to worsening asthma, and seeking emergency care when needed. To track your asthma, keep records of your symptoms, check your peak flow number using a peak flow meter (handheld device that shows how well air moves out of your lungs), and get regular asthma checkups.  Other ways to prevent asthma attacks include:  Use medicines as your caregiver directs.   Identify and avoid things that make your asthma worse (as much as you can).   Keep track of your asthma symptoms and level of control.   Get regular checkups for your asthma.   With your caregiver, write a detailed plan for taking medicines and managing an asthma attack. Then be sure to follow your action plan. Asthma is an ongoing condition that needs regular monitoring and treatment.   Identify and avoid asthma triggers. A number of outdoor allergens and irritants (pollen, mold, cold air, air pollution) can trigger asthma attacks. Find out what causes or makes your asthma worse, and take steps to avoid those triggers (see below).   Monitor your breathing. Learn to recognize warning signs of an attack, such as slight coughing, wheezing or shortness of breath. However, your lung function may already decrease before you notice any signs or symptoms, so regularly measure and record your peak airflow with a home peak flow meter.   Identify and treat attacks early. If you act quickly, you're less likely to have  a severe attack. You will also need less medicine to control your symptoms. When your peak flow measurements decrease and alert you to an upcoming attack, take your medicine as instructed, and immediately stop any activity that may have triggered the attack. If your symptoms do not improve, get medical help.   Pay attention to increasing quick-relief inhaler use. If you find yourself relying on your quick-relief inhaler (such as albuterol), your asthma is not under control. See your caregiver about adjusting your treatment.  IDENTIFY AND CONTROL FACTORS THAT MAKE YOUR ASTHMA WORSE A number of common things can set off or make your asthma symptoms worse (asthma triggers). Keep track of your asthma symptoms for several weeks, detailing all the environmental and emotional factors that are linked with your asthma. When you have an asthma attack, go back to your asthma diary to see which factor, or combination of factors, might have contributed to it. Once you know what these factors are, you can take steps to control many of them.  Allergies: If you have allergies and asthma, it is important to take asthma prevention steps at home. Asthma attacks (worsening of asthma symptoms) can be triggered by allergies, which can cause temporary increased inflammation of your airways. Minimizing contact with the substance to which you are allergic will help prevent an asthma attack. Animal Dander:   Some people are allergic to the flakes of skin or dried saliva from animals with fur or feathers. Keep these pets out of your home.   If you can't keep a pet outdoors, keep the   pet out of your bedroom and other sleeping areas at all times, and keep the door closed.   Remove carpets and furniture covered with cloth from your home. If that is not possible, keep the pet away from fabric-covered furniture and carpets.  Dust Mites:  Many people with asthma are allergic to dust mites. Dust mites are tiny bugs that are found in  every home, in mattresses, pillows, carpets, fabric-covered furniture, bedcovers, clothes, stuffed toys, fabric, and other fabric-covered items.   Cover your mattress in a special dust-proof cover.   Cover your pillow in a special dust-proof cover, or wash the pillow each week in hot water. Water must be hotter than 130 F to kill dust mites. Cold or warm water used with detergent and bleach can also be effective.   Wash the sheets and blankets on your bed each week in hot water.   Try not to sleep or lie on cloth-covered cushions.   Call ahead when traveling and ask for a smoke-free hotel room. Bring your own bedding and pillows, in case the hotel only supplies feather pillows and down comforters, which may contain dust mites and cause asthma symptoms.   Remove carpets from your bedroom and those laid on concrete, if you can.   Keep stuffed toys out of the bed, or wash the toys weekly in hot water or cooler water with detergent and bleach.  Cockroaches:  Many people with asthma are allergic to the droppings and remains of cockroaches.   Keep food and garbage in closed containers. Never leave food out.   Use poison baits, traps, powders, gels, or paste (for example, boric acid).   If a spray is used to kill cockroaches, stay out of the room until the odor goes away.  Indoor Mold:  Fix leaky faucets, pipes, or other sources of water that have mold around them.   Clean moldy surfaces with a cleaner that has bleach in it.  Pollen and Outdoor Mold:  When pollen or mold spore counts are high, try to keep your windows closed.   Stay indoors with windows closed from late morning to afternoon, if you can. Pollen and some mold spore counts are highest at that time.   Ask your caregiver whether you need to take or increase anti-inflammatory medicine before your allergy season starts.  Irritants:   Tobacco smoke is an irritant. If you smoke, ask your caregiver how you can quit. Ask family  members to quit smoking, too. Do not allow smoking in your home or car.   If possible, do not use a wood-burning stove, kerosene heater, or fireplace. Minimize exposure to all sources of smoke, including incense, candles, fires, and fireworks.   Try to stay away from strong odors and sprays, such as perfume, talcum powder, hair spray, and paints.   Decrease humidity in your home and use an indoor air cleaning device. Reduce indoor humidity to below 60 percent. Dehumidifiers or central air conditioners can do this.   Try to have someone else vacuum for you once or twice a week, if you can. Stay out of rooms while they are being vacuumed and for a short while afterward.   If you vacuum, use a dust mask from a hardware store, a double-layered or microfilter vacuum cleaner bag, or a vacuum cleaner with a HEPA filter.   Sulfites in foods and beverages can be irritants. Do not drink beer or wine, or eat dried fruit, processed potatoes, or shrimp if they cause asthma   symptoms.   Cold air can trigger an asthma attack. Cover your nose and mouth with a scarf on cold or windy days.   Several health conditions can make asthma more difficult to manage, including runny nose, sinus infections, reflux disease, psychological stress, and sleep apnea. Your caregiver will treat these conditions, as well.   Avoid close contact with people who have a cold or the flu, since your asthma symptoms may get worse if you catch the infection from them. Wash your hands thoroughly after touching items that may have been handled by people with a respiratory infection.   Get a flu shot every year to protect against the flu virus, which often makes asthma worse for days or weeks. Also get a pneumonia shot once every five to 10 years.  Drugs:  Aspirin and other painkillers can cause asthma attacks. 10% to 20% of people with asthma have sensitivity to aspirin or a group of painkillers called non-steroidal anti-inflammatory drugs  (NSAIDS), such as ibuprofen and naproxen. These drugs are used to treat pain and reduce fevers. Asthma attacks caused by any of these medicines can be severe and even fatal. These drugs must be avoided in people who have known aspirin sensitive asthma. Products with acetaminophen are considered safe for people who have asthma. It is important that people with aspirin sensitivity read labels of all over-the-counter drugs used to treat pain, colds, coughs, and fever.   Beta blockers and ACE inhibitors are other drugs which you should discuss with your caregiver, in relation to your asthma.  ALLERGY SKIN TESTING  Ask your asthma caregiver about allergy skin testing or blood testing (RAST test) to identify the allergens to which you are sensitive. If you are found to have allergies, allergy shots (immunotherapy) for asthma may help prevent future allergies and asthma. With allergy shots, small doses of allergens (substances to which you are allergic) are injected under your skin on a regular schedule. Over a period of time, your body may become used to the allergen and less responsive with asthma symptoms. You can also take measures to minimize your exposure to those allergens. EXERCISE  If you have exercise-induced asthma, or are planning vigorous exercise, or exercise in cold, humid, or dry environments, prevent exercise-induced asthma by following your caregiver's advice regarding asthma treatment before exercising. Document Released: 08/21/2009 Document Revised: 08/22/2011 Document Reviewed: 08/21/2009 [employer] Patient Information 2012 [employer]. 

## 2012-01-30 ENCOUNTER — Emergency Department (HOSPITAL_COMMUNITY)
Admission: EM | Admit: 2012-01-30 | Discharge: 2012-01-30 | Disposition: A | Discharge status: Home / Self Care | Payer: BC Managed Care – PPO | Source: Home / Self Care | Attending: Emergency Medicine | Admitting: Emergency Medicine

## 2012-01-30 ENCOUNTER — Encounter (HOSPITAL_COMMUNITY): Payer: Self-pay | Admitting: Emergency Medicine

## 2012-01-30 DIAGNOSIS — K611 Rectal abscess: Secondary | ICD-10-CM

## 2012-01-30 DIAGNOSIS — K612 Anorectal abscess: Secondary | ICD-10-CM

## 2012-01-30 MED ORDER — DOXYCYCLINE HYCLATE 100 MG PO CAPS: 100.0000 mg | ORAL_CAPSULE | Freq: Two times a day (BID) | ORAL | Status: AC

## 2012-01-30 MED ORDER — IBUPROFEN 800 MG PO TABS: 800.0000 mg | ORAL_TABLET | Freq: Three times a day (TID) | ORAL | Status: AC

## 2012-01-30 NOTE — ED Notes (Signed)
History of abscess.  Patient reports todays abscess is "beside anus". Onset Sunday .

## 2012-01-30 NOTE — Discharge Instructions (Signed)
  Returns Saturday so we can recheck to sepsis and remove packing as explained.   Peri-Rectal Abscess Your caregiver has diagnosed you as having a peri-rectal abscess. This is an infected area near the rectum that is filled with pus. If the abscess is near the surface of the skin, your caregiver may open (incise) the area and drain the pus. HOME CARE INSTRUCTIONS   If your abscess was opened up and drained. A small piece of gauze may be placed in the opening so that it can drain. Do not remove the gauze unless directed by your caregiver.   A loose dressing may be placed over the abscess site. Change the dressing as often as necessary to keep it clean and dry.   After the drain is removed, the area may be washed with a gentle antiseptic (soap) four times per day.   A warm sitz bath, warm packs or heating pad may be used for pain relief, taking care not to burn yourself.   Return for a wound check in 1 day or as directed.   An "inflatable doughnut" may be used for sitting with added comfort. These can be purchased at a drugstore or medical supply house.   To reduce pain and straining with bowel movements, eat a high fiber diet with plenty of fruits and vegetables. Use stool softeners as recommended by your caregiver. This is especially important if narcotic type pain medications were prescribed as these may cause marked constipation.   Only take over-the-counter or prescription medicines for pain, discomfort, or fever as directed by your caregiver.  SEEK IMMEDIATE MEDICAL CARE IF:   You have increasing pain that is not controlled by medication.   There is increased inflammation (redness), swelling, bleeding, or drainage from the area.   An oral temperature above 102 F (38.9 C) develops.   You develop chills or generalized malaise (feel lethargic or feel "washed out").   You develop any new symptoms (problems) you feel may be related to your present problem.  Document Released:  08/30/2000 Document Revised: 08/22/2011 Document Reviewed: 08/30/2008 [facility] Patient Information 2012 [place], [place].

## 2012-01-30 NOTE — ED Notes (Signed)
Assisted dr Ladon Applebaum with i/d

## 2012-01-30 NOTE — ED Provider Notes (Addendum)
History     CSN: [id number]  Arrival date & time 01/30/12  0847   First MD Initiated Contact with Patient 01/30/12 [phone]      Chief Complaint  Patient presents with  . Abscess    (Consider location/radiation/quality/duration/timing/severity/associated sxs/prior treatment) HPI Comments: Patient presents urgent care today complaining of ongoing pain in her inner gluteal area near her anus", started smaller and now feels a bit more swollen and more tender have been trying to avoid sitting on it but I think I have develop an abscess. It all started Sunday and has become progressively worse. No fevers, no chills, no rectal bleeding no abdominal pain  Patient is a 36 y.o. female presenting with abscess. The history is provided by the patient.  Abscess  This is a new problem. The current episode started today. The problem has been unchanged. The problem is moderate. Pertinent negatives include no fever and no diarrhea.    Past Medical History  Diagnosis Date  . Asthma   . Depression   . Syncope   . Migraines     Dr.Paul Daphine Deutscher in [place]  . PCOS (polycystic ovarian syndrome)     [name]    Past Surgical History  Procedure Date  . None     Family History  Problem Relation Age of Onset  . Breast cancer Mother 27  . Breast cancer  41    Maternal Cousin  . Diabetes Mother   . Diabetes Maternal Grandmother   . Diabetes Maternal Aunt   . Arthritis Maternal Grandmother   . Arthritis Mother   . Hyperlipidemia Paternal Aunt   . Hyperlipidemia Paternal Uncle   . Stroke Paternal Uncle   . Hypertension    . Kidney disease Paternal Uncle   . Depression    . Heart disease Paternal Uncle 34    Sudden death    History  Substance Use Topics  . Smoking status: Never Smoker   . Smokeless tobacco: Never Used  . Alcohol Use: No    OB History    Grav Para Term Preterm Abortions TAB SAB Ect Mult Living   0               Review of Systems  Constitutional: Negative for  fever, chills and diaphoresis.  Gastrointestinal: Positive for rectal pain. Negative for nausea, abdominal pain, diarrhea, constipation, blood in stool and abdominal distention.  Skin: Negative for rash and wound.    Allergies  Codeine; Hydrocodone; and Oxycodone  Home Medications   Current Outpatient Rx  Name Route Sig Dispense Refill  . VENTOLIN IN Inhalation Inhale into the lungs.    . ALBUTEROL SULFATE HFA 108 (90 BASE) MCG/ACT IN AERS Inhalation Inhale 2 puffs into the lungs every 6 (six) hours as needed for wheezing. 1 Inhaler 1  . BECLOMETHASONE DIPROPIONATE 40 MCG/ACT IN AERS Inhalation Inhale 2 puffs into the lungs 2 (two) times daily. 1 Inhaler 12  . CETIRIZINE HCL 10 MG PO TABS Oral Take 10 mg by mouth daily.      Marland Kitchen DOXYCYCLINE HYCLATE 100 MG PO CAPS Oral Take 1 capsule (100 mg total) by mouth 2 (two) times daily. 20 capsule 0  . IBUPROFEN 800 MG PO TABS Oral Take 1 tablet (800 mg total) by mouth 3 (three) times daily. 21 tablet 0  . IPRATROPIUM BROMIDE 0.06 % NA SOLN Nasal Place 2 sprays into the nose 4 (four) times daily. 15 mL 12  . MOMETASONE FUROATE 50 MCG/ACT NA SUSP  Nasal Place 2 sprays into the nose daily. 17 g 12  . NORETHIN-ETH ESTRAD-FE BIPHAS 1 MG-10 MCG / 10 MCG PO TABS Oral Take 1 tablet by mouth daily. 1 Package 11  . PREDNISONE 10 MG PO TABS  3 po qd for 3 days then 2 po qd for 3 days the 1 po qd for 3 days 18 tablet 0  . PROMETHAZINE HCL 25 MG PO TABS  every 6 (six) hours as needed.     Willette Brace IJ Injection Inject 6 mg as directed 2 (two) times a week.      . TOPIRAMATE 25 MG PO TABS Oral Take 75 mg by mouth daily.        BP 108/74  Pulse 106  Temp(Src) 98.5 F (36.9 C) (Oral)  Resp 20  SpO2 100%  LMP 01/30/2012  Physical Exam  Nursing note and vitals reviewed. Constitutional: She appears well-developed and well-nourished.  HENT:  Head: Normocephalic.  Eyes: Conjunctivae are normal.  Neck: Neck supple.  Pulmonary/Chest: Effort normal.    Abdominal: She exhibits no mass. There is no tenderness. There is no rebound and no guarding.  Genitourinary:     Skin: Skin is warm. No rash noted.    ED Course  INCISION AND DRAINAGE Performed by: Mykayla Brinton Authorized by: Jimmie Molly Consent: Verbal consent obtained. Consent given by: patient Patient understanding: patient states understanding of the procedure being performed Patient identity confirmed: verbally with patient Type: abscess Anesthesia: local infiltration Local anesthetic: lidocaine 1% with epinephrine Anesthetic total: 8 ml Patient sedated: no Scalpel size: 11 Incision type: single straight Drainage: purulent Packing material: 1/2 in iodoform gauze and 1/4 in iodoform gauze Patient tolerance: Patient tolerated the procedure well with no immediate complications.   (including critical care time)   Labs Reviewed  CULTURE, ROUTINE-ABSCESS   No results found.   1. Perirectal abscess       MDM  Small perirectal abscess that was successfully I&D today with significant clinical improvement. Patient was started on antibiotics and Kirsten followup in 48 hours for back normal we'll recheck.        Jimmie Molly, MD 01/30/12 1519  Jimmie Molly, MD 01/30/12 1520

## 2012-02-01 ENCOUNTER — Emergency Department (HOSPITAL_COMMUNITY)
Admission: EM | Admit: 2012-02-01 | Discharge: 2012-02-01 | Disposition: A | Discharge status: Home / Self Care | Payer: BC Managed Care – PPO | Source: Home / Self Care | Attending: Emergency Medicine | Admitting: Emergency Medicine

## 2012-02-01 ENCOUNTER — Encounter (HOSPITAL_COMMUNITY): Payer: Self-pay

## 2012-02-01 DIAGNOSIS — K612 Anorectal abscess: Secondary | ICD-10-CM

## 2012-02-01 DIAGNOSIS — K611 Rectal abscess: Secondary | ICD-10-CM

## 2012-02-01 DIAGNOSIS — R000 Tachycardia, unspecified: Secondary | ICD-10-CM

## 2012-02-01 NOTE — ED Provider Notes (Signed)
History     CSN: [id number]  Arrival date & time 02/01/12  0901   First MD Initiated Contact with Patient 02/01/12 [phone]      Chief Complaint  Patient presents with  . Wound Check    (Consider location/radiation/quality/duration/timing/severity/associated sxs/prior treatment) HPI Comments: Patient returns after 48 hours for wound recheck in packing removal. Taking antibiotics without side effects or problems. Patient describes significant improvement minimal drainage from dressings observed since yesterday. Continues with no fevers and no other symptoms tenderness has improved significantly although still perceives some discomfort in the area when she sits.      Patient is a 36 y.o. female presenting with wound check. The history is provided by the patient.  Wound Check  She was treated in the ED 2 to 3 days ago. Previous treatment in the ED includes I&D of abscess. Treatments since wound repair include oral antibiotics and a wound recheck. There has been bloody discharge from the wound. The redness has improved. The swelling has improved. The pain has improved.    Past Medical History  Diagnosis Date  . Asthma   . Depression   . Syncope   . Migraines     Dr.Paul Daphine Deutscher in [place]  . PCOS (polycystic ovarian syndrome)     [name]    Past Surgical History  Procedure Date  . None     Family History  Problem Relation Age of Onset  . Breast cancer Mother 11  . Breast cancer  41    Maternal Cousin  . Diabetes Mother   . Diabetes Maternal Grandmother   . Diabetes Maternal Aunt   . Arthritis Maternal Grandmother   . Arthritis Mother   . Hyperlipidemia Paternal Aunt   . Hyperlipidemia Paternal Uncle   . Stroke Paternal Uncle   . Hypertension    . Kidney disease Paternal Uncle   . Depression    . Heart disease Paternal Uncle 61    Sudden death    History  Substance Use Topics  . Smoking status: Never Smoker   . Smokeless tobacco: Never Used  . Alcohol  Use: No    OB History    Grav Para Term Preterm Abortions TAB SAB Ect Mult Living   0               Review of Systems  Constitutional: Negative for fever, chills and appetite change.    Allergies  Codeine; Hydrocodone; and Oxycodone  Home Medications   Current Outpatient Rx  Name Route Sig Dispense Refill  . ALBUTEROL SULFATE HFA 108 (90 BASE) MCG/ACT IN AERS Inhalation Inhale 2 puffs into the lungs every 6 (six) hours as needed for wheezing. 1 Inhaler 1  . VENTOLIN IN Inhalation Inhale into the lungs.    . BECLOMETHASONE DIPROPIONATE 40 MCG/ACT IN AERS Inhalation Inhale 2 puffs into the lungs 2 (two) times daily. 1 Inhaler 12  . CETIRIZINE HCL 10 MG PO TABS Oral Take 10 mg by mouth daily.      Marland Kitchen DOXYCYCLINE HYCLATE 100 MG PO CAPS Oral Take 1 capsule (100 mg total) by mouth 2 (two) times daily. 20 capsule 0  . IBUPROFEN 800 MG PO TABS Oral Take 1 tablet (800 mg total) by mouth 3 (three) times daily. 21 tablet 0  . IPRATROPIUM BROMIDE 0.06 % NA SOLN Nasal Place 2 sprays into the nose 4 (four) times daily. 15 mL 12  . MOMETASONE FUROATE 50 MCG/ACT NA SUSP Nasal Place 2 sprays into the  nose daily. 17 g 12  . NORETHIN-ETH ESTRAD-FE BIPHAS 1 MG-10 MCG / 10 MCG PO TABS Oral Take 1 tablet by mouth daily. 1 Package 11  . PREDNISONE 10 MG PO TABS  3 po qd for 3 days then 2 po qd for 3 days the 1 po qd for 3 days 18 tablet 0  . PROMETHAZINE HCL 25 MG PO TABS  every 6 (six) hours as needed.     Willette Brace IJ Injection Inject 6 mg as directed 2 (two) times a week.      . TOPIRAMATE 25 MG PO TABS Oral Take 75 mg by mouth daily.        BP 136/93  Pulse 135  Temp(Src) 98.6 F (37 C) (Oral)  Resp 20  SpO2 99%  LMP 01/30/2012  Physical Exam  Nursing note and vitals reviewed. Constitutional: She appears well-developed.  Non-toxic appearance. She does not have a sickly appearance. She does not appear ill. No distress.    Cardiovascular: Normal heart sounds.   No extrasystoles are present.  Tachycardia present.  Exam reveals no gallop and no friction rub.   No murmur heard. Neurological: She is alert.  Skin: She is not diaphoretic.    ED Course  Procedures (including critical care time)  Labs Reviewed - No data to display No results found.   1. Perirectal abscess   2. Tachycardia       MDM  Uncomplicated perirectal abscess followup after 48 hours with remarkable improvement. Patient also is noted to be tachycardic on further questioning with patient and chart review consultation with cardiologist and extensive workup have been performed in this patient described cardiologist followed this is just the way her heart rate is, patient denies any symptoms such as chest pains, shortness of breath or palpitations.     Jimmie Molly, MD 02/01/12 1011

## 2012-02-01 NOTE — ED Notes (Signed)
Pt here to have recheck of abscess and removal of packing.

## 2012-02-01 NOTE — Discharge Instructions (Signed)
As discussed expect tenderness should be remarkably improved after 2-3 more days. If area becomes tender or no improvement or new drainage should go to the emergency department as possibly a surgical consult another imaging studies will need to be performed to make sure the abscess was completely resolvedPeri-Rectal Abscess Your caregiver has diagnosed you as having a peri-rectal abscess. This is an infected area near the rectum that is filled with pus. If the abscess is near the surface of the skin, your caregiver may open (incise) the area and drain the pus. HOME CARE INSTRUCTIONS   If your abscess was opened up and drained. A small piece of gauze may be placed in the opening so that it can drain. Do not remove the gauze unless directed by your caregiver.   A loose dressing may be placed over the abscess site. Change the dressing as often as necessary to keep it clean and dry.   After the drain is removed, the area may be washed with a gentle antiseptic (soap) four times per day.   A warm sitz bath, warm packs or heating pad may be used for pain relief, taking care not to burn yourself.   Return for a wound check in 1 day or as directed.   An "inflatable doughnut" may be used for sitting with added comfort. These can be purchased at a drugstore or medical supply house.   To reduce pain and straining with bowel movements, eat a high fiber diet with plenty of fruits and vegetables. Use stool softeners as recommended by your caregiver. This is especially important if narcotic type pain medications were prescribed as these may cause marked constipation.   Only take over-the-counter or prescription medicines for pain, discomfort, or fever as directed by your caregiver.  SEEK IMMEDIATE MEDICAL CARE IF:   You have increasing pain that is not controlled by medication.   There is increased inflammation (redness), swelling, bleeding, or drainage from the area.   An oral temperature above 102 F  (38.9 C) develops.   You develop chills or generalized malaise (feel lethargic or feel "washed out").   You develop any new symptoms (problems) you feel may be related to your present problem.  Document Released: 08/30/2000 Document Revised: 08/22/2011 Document Reviewed: 08/30/2008 [facility] Patient Information 2012 [place], [place].

## 2012-02-01 NOTE — ED Notes (Signed)
Pts heartrate was 114 on discharge.

## 2012-02-02 LAB — CULTURE, ROUTINE-ABSCESS

## 2012-03-31 ENCOUNTER — Telehealth: Payer: Self-pay | Admitting: Family Medicine

## 2012-03-31 ENCOUNTER — Ambulatory Visit (INDEPENDENT_AMBULATORY_CARE_PROVIDER_SITE_OTHER): Payer: BC Managed Care – PPO | Admitting: Internal Medicine

## 2012-03-31 VITALS — BP 118/90 | HR 134 | Temp 98.7°F | Wt 227.0 lb

## 2012-03-31 DIAGNOSIS — R197 Diarrhea, unspecified: Secondary | ICD-10-CM

## 2012-03-31 MED ORDER — METRONIDAZOLE 500 MG PO TABS: 500.0000 mg | ORAL_TABLET | Freq: Three times a day (TID) | ORAL | Status: AC

## 2012-03-31 NOTE — Telephone Encounter (Signed)
LMP 05/13; no period is June 2013.  Caller: Stacy Gentry/Patient; PCP: Lelon Perla.; CB#: [phone]; ; ; Call regarding Abdominal Pain Noted After Eating; Ongoing X. 1. Month.  Pain starts within 10-15  min of eating and can last 2-4 hours. Pain is descibed as "a burning ring around the stomach."  Denies vomiting, but has had nausea with this.  States pain is worse when she is hungry, but pain is not relieved by eating.  Afebrile.  Has had diarrhea as well which has not resolved within 24 hours.  Per protocol, emergent symptoms denied; advised appt within 24 hours.  Appt sched 03/31/12 1430 with Dr. Drue Novel.

## 2012-03-31 NOTE — Patient Instructions (Signed)
Please go to the lab and pick up containers for stool studies: C. difficile, Hemoccult, stool culture---- dx  DX diarrhea ------------ Start taking Flagyl Drink plenty of fluids Over-the-counter Prilosec 20 mg one tablet before breakfast  X 2 weeks Call anytime if you've worse, have increased abdominal pain-diarrhea, blood in the stools or if you are not improving by the next week.

## 2012-03-31 NOTE — Assessment & Plan Note (Addendum)
GI symptoms started about a month ago after she was exposed to a stomach virus and also  after she took doxycycline for a perirectal abscess. Current symptoms, abdominal pain and a small amount of diarrhea, are most likely from a colon rather than a enteric infection.  Plan: Treat empirically for C. Difficile; Flagyl 500 mg 3 times a day for  7 days Stool studies See instructions If not improving, will need further workup including ultrasound of the abdomen to rule out GB stones, GI referral?

## 2012-03-31 NOTE — Progress Notes (Signed)
  Subjective:    Patient ID: Stacy Gentry, female    DOB: [birth year], 36 y.o.   MRN: [id number]  HPI Acute visit About a month ago developed nausea, diarrhea and abdominal pain. Symptoms happened shortly after she was in contact with her niece who had a stomach virus. Also 4 weeks prior to the onset of symptoms she was prescribed doxycycline. Initial symptoms subsided except for mild diarrhea. The stools since then are loose, no watery,  not associated with blood or mucus per rectum. About a week ago the stomach pain resurface: It is described as "almost steady" cramps, mostly in the upper abdomen, increase when she eats, feels like gas and feels full very easily.  Past Medical History  Diagnosis Date  . Asthma   . Depression   . Syncope   . Migraines     Dr.Paul Daphine Deutscher in [place]  . PCOS (polycystic ovarian syndrome)     [name]   Past Surgical History  Procedure Date  . None     Review of Systems Denies any fever chills, continue with nausea on and off but no vomiting. Some upper abdominal burning but no classic heartburn. Again no blood in the stools. No dysuria gross hematuria.     Objective:   Physical Exam General -- alert, well-developed, and overweight appearing. No apparent distress.  Lungs -- normal respiratory effort, no intercostal retractions, no accessory muscle use, and normal breath sounds.   Heart-- tachycardic without murmur (tachycardic is a chronic finding) Abdomen-- soft, nondistended, bowel sounds slightly  increased, not particularly tender.   Extremities-- no pretibial edema bilaterally  Neurologic-- alert & oriented X3 and strength normal in all extremities. Psych-- Cognition and judgment appear intact. Alert and cooperative with normal attention span and concentration.  not anxious appearing and not depressed appearing.      Assessment & Plan:

## 2012-03-31 NOTE — Telephone Encounter (Signed)
FYI

## 2012-04-01 ENCOUNTER — Encounter: Payer: Self-pay | Admitting: Family Medicine

## 2012-04-01 ENCOUNTER — Encounter: Payer: Self-pay | Admitting: Internal Medicine

## 2012-04-03 ENCOUNTER — Other Ambulatory Visit (INDEPENDENT_AMBULATORY_CARE_PROVIDER_SITE_OTHER): Payer: BC Managed Care – PPO

## 2012-04-03 DIAGNOSIS — Z1289 Encounter for screening for malignant neoplasm of other sites: Secondary | ICD-10-CM

## 2012-04-03 LAB — POC HEMOCCULT BLD/STL (OFFICE/1-CARD/DIAGNOSTIC): Fecal Occult Blood, POC: NEGATIVE

## 2012-04-06 ENCOUNTER — Telehealth: Payer: Self-pay | Admitting: Internal Medicine

## 2012-04-06 DIAGNOSIS — R1010 Upper abdominal pain, unspecified: Secondary | ICD-10-CM

## 2012-04-06 NOTE — Telephone Encounter (Signed)
Discontinue Flagyl Schedule an abdominal ultrasound DX upper abdominal pain

## 2012-04-06 NOTE — Telephone Encounter (Signed)
Advise pt stool studies negative. If she is feeling better , needs to finish flagyl and call if sx come back. If not better, let me know

## 2012-04-06 NOTE — Telephone Encounter (Signed)
Left detailed msg on pt's vmail. Entered u/s order.

## 2012-04-06 NOTE — Telephone Encounter (Signed)
Spoke with pt & she states she is not feeling any better. She thinks the flagyl is making her more nauseated. Please advise.

## 2012-04-08 ENCOUNTER — Other Ambulatory Visit: Payer: BC Managed Care – PPO

## 2012-04-08 ENCOUNTER — Ambulatory Visit
Admission: RE | Admit: 2012-04-08 | Discharge: 2012-04-08 | Disposition: A | Discharge status: Home / Self Care | Payer: BC Managed Care – PPO | Source: Ambulatory Visit | Attending: Internal Medicine | Admitting: Internal Medicine

## 2012-04-08 DIAGNOSIS — R1010 Upper abdominal pain, unspecified: Secondary | ICD-10-CM

## 2012-04-10 ENCOUNTER — Telehealth: Payer: Self-pay

## 2012-04-10 MED ORDER — HYOSCYAMINE SULFATE 0.125 MG SL SUBL: 0.1250 mg | SUBLINGUAL_TABLET | SUBLINGUAL | Status: DC | PRN

## 2012-04-10 NOTE — Telephone Encounter (Signed)
Discussed with patient and she voiced understanding.prescription for Levsin sent and she will follow up prn.    KP

## 2012-04-10 NOTE — Telephone Encounter (Signed)
Message copied by Arnette Norris on Fri Apr 10, 2012  5:37 PM ------      Message from: Lelon Perla      Created: Fri Apr 10, 2012  9:19 AM       Can call in levsin sl 1-2 q4h prn cramping  #30  And refer to GI-----may be viral but she should be feeling better

## 2012-04-14 ENCOUNTER — Encounter: Payer: BC Managed Care – PPO | Admitting: Family Medicine

## 2012-05-22 ENCOUNTER — Other Ambulatory Visit: Payer: Self-pay | Admitting: Gynecology

## 2012-06-24 ENCOUNTER — Encounter: Payer: BC Managed Care – PPO | Admitting: Gynecology

## 2012-08-18 ENCOUNTER — Encounter: Payer: Self-pay | Admitting: Internal Medicine

## 2012-08-18 ENCOUNTER — Ambulatory Visit (INDEPENDENT_AMBULATORY_CARE_PROVIDER_SITE_OTHER): Payer: BC Managed Care – PPO | Admitting: Internal Medicine

## 2012-08-18 VITALS — BP 118/82 | HR 100 | Temp 98.2°F | Wt 239.0 lb

## 2012-08-18 DIAGNOSIS — J45909 Unspecified asthma, uncomplicated: Secondary | ICD-10-CM

## 2012-08-18 DIAGNOSIS — J329 Chronic sinusitis, unspecified: Secondary | ICD-10-CM

## 2012-08-18 DIAGNOSIS — J4521 Mild intermittent asthma with (acute) exacerbation: Secondary | ICD-10-CM

## 2012-08-18 MED ORDER — PREDNISONE 10 MG PO TABS: ORAL_TABLET | ORAL | Status: DC

## 2012-08-18 MED ORDER — AZITHROMYCIN 250 MG PO TABS: ORAL_TABLET | ORAL | Status: DC

## 2012-08-18 MED ORDER — BECLOMETHASONE DIPROPIONATE 40 MCG/ACT IN AERS: 2.0000 | INHALATION_SPRAY | Freq: Two times a day (BID) | RESPIRATORY_TRACT | Status: DC

## 2012-08-18 NOTE — Assessment & Plan Note (Addendum)
Asthma with acute exacerbation, prior to this exacerbation asthma was well controlled despite not taking qvar every day. Plan: Z-Pak, prednisone. See instructions Qvar twice a day everyday.

## 2012-08-18 NOTE — Progress Notes (Signed)
  Subjective:    Patient ID: Stacy Gentry, female    DOB: 18-Aug-1976, 36 y.o.   MRN: [id number]  HPI Acute visit Nocturnal cough for one week, some symptoms are within time as well. Albuterol does help but does not control the cough completely. She is taking Qvar but not daily.  Past Medical History  Diagnosis Date  . Asthma   . Depression   . Syncope   . Migraines     Dr.Paul Daphine Deutscher in [place]  . PCOS (polycystic ovarian syndrome)     [name]   Past Surgical History  Procedure Date  . None     Review of Systems No fever or chills Only mild shortness of breath and is relieved by albuterol. Some yellow sputum in the mornings, yellow in color Denies any sinus pain or discharge, no itchy eyes or itchy nose. She's not taking Zyrtec or Nasonex. She has neck pain and started diclofenac about a month ago. Birth control pills were discontinued, last menstrual period last week, she has not been sexually active in a while.    Objective:   Physical Exam General -- alert, well-developed, and overweight appearing. No apparent distress. Pulse 100, O2 sat 99, BP normal HEENT -- TMs normal, throat w/o redness, face symmetric and not tender to palpation, nose not congested   Lungs -- normal respiratory effort, no intercostal retractions, no accessory muscle use, and  few end expiratory wheezing. Heart-- normal rate, regular rhythm, no murmur, and no gallop.    Psych-- Cognition and judgment appear intact. Alert and cooperative with normal attention span and concentration.  not anxious appearing and not depressed appearing.        Assessment & Plan:

## 2012-08-18 NOTE — Patient Instructions (Addendum)
Rest, fluids , tylenol, Mucinex DM as needed for cough. Take Qvar twice a day every day Albuterol 2 puffs every 6 hours as needed for wheezing. Prednisone for a few days Zithromax for 5 days Call if no better in few days Call anytime if the symptoms are severe  ---- Once you are better, get a flu shot at the pharmacy Please schedule a routine visit with Dr. Laury Axon in 3 or 4 months

## 2012-09-22 ENCOUNTER — Ambulatory Visit (INDEPENDENT_AMBULATORY_CARE_PROVIDER_SITE_OTHER): Payer: BC Managed Care – PPO | Admitting: Family Medicine

## 2012-09-22 ENCOUNTER — Encounter: Payer: Self-pay | Admitting: Family Medicine

## 2012-09-22 VITALS — BP 120/76 | HR 131 | Temp 99.4°F | Wt 236.8 lb

## 2012-09-22 DIAGNOSIS — J329 Chronic sinusitis, unspecified: Secondary | ICD-10-CM

## 2012-09-22 DIAGNOSIS — J40 Bronchitis, not specified as acute or chronic: Secondary | ICD-10-CM

## 2012-09-22 DIAGNOSIS — J45901 Unspecified asthma with (acute) exacerbation: Secondary | ICD-10-CM

## 2012-09-22 DIAGNOSIS — J4521 Mild intermittent asthma with (acute) exacerbation: Secondary | ICD-10-CM

## 2012-09-22 MED ORDER — PREDNISONE 10 MG PO TABS: ORAL_TABLET | ORAL | Status: DC

## 2012-09-22 MED ORDER — AZITHROMYCIN 250 MG PO TABS: ORAL_TABLET | ORAL | Status: DC

## 2012-09-22 MED ORDER — ALBUTEROL SULFATE (2.5 MG/3ML) 0.083% IN NEBU
2.5000 mg | INHALATION_SOLUTION | Freq: Once | RESPIRATORY_TRACT | Status: AC
  Administered 2012-09-22: 2.5 mg via RESPIRATORY_TRACT

## 2012-09-22 MED ORDER — METHYLPREDNISOLONE ACETATE 80 MG/ML IJ SUSP
80.0000 mg | Freq: Once | INTRAMUSCULAR | Status: AC
  Administered 2012-09-22: 80 mg via INTRAMUSCULAR

## 2012-09-22 MED ORDER — BECLOMETHASONE DIPROPIONATE 40 MCG/ACT IN AERS: 2.0000 | INHALATION_SPRAY | Freq: Two times a day (BID) | RESPIRATORY_TRACT | Status: DC

## 2012-09-22 NOTE — Patient Instructions (Signed)

## 2012-09-22 NOTE — Addendum Note (Signed)
Addended by: Arnette Norris on: 09/22/2012 04:32 PM   Modules accepted: Orders

## 2012-09-22 NOTE — Progress Notes (Signed)
  Subjective:     Stacy Gentry is a 37 y.o. female here for evaluation of a cough. Onset of symptoms was 5 days ago. Symptoms have been gradually worsening since that time. The cough is productive and is aggravated by cold air, exercise, infection, pollens and reclining position. Associated symptoms include: postnasal drip, shortness of breath, sputum production and wheezing. Patient does have a history of asthma. Patient does have a history of environmental allergens. Patient has not traveled recently. Patient does not have a history of smoking. Patient has not had a previous chest x-ray. Patient has not had a PPD done.  The following portions of the patient's history were reviewed and updated as appropriate: allergies, current medications, past family history, past medical history, past social history, past surgical history and problem list.  Review of Systems Pertinent items are noted in HPI.    Objective:    Oxygen saturation 96% on room air BP 120/76  Pulse 131  Temp 99.4 F (37.4 C) (Oral)  Wt 236 lb 12.8 oz (107.412 kg)  SpO2 96% General appearance: alert, cooperative, appears stated age and no distress Ears: normal TM's and external ear canals both ears Nose: Nares normal. Septum midline. Mucosa normal. No drainage or sinus tenderness. Throat: lips, mucosa, and tongue normal; teeth and gums normal Neck: mild anterior cervical adenopathy, supple, symmetrical, trachea midline and thyroid not enlarged, symmetric, no tenderness/mass/nodules Lungs: diminished breath sounds bilaterally Heart: S1, S2 normal    Assessment:    Acute Bronchitis and Asthma    Plan:    Antibiotics per medication orders. Antitussives per medication orders. Avoid exposure to tobacco smoke and fumes. B-agonist inhaler. Call if shortness of breath worsens, blood in sputum, change in character of cough, development of fever or chills, inability to maintain nutrition and hydration. Avoid exposure to  tobacco smoke and fumes. Steroid inhaler as ordered.  pred taper and depo medrol

## 2012-09-23 ENCOUNTER — Telehealth: Payer: Self-pay

## 2012-09-23 DIAGNOSIS — J4521 Mild intermittent asthma with (acute) exacerbation: Secondary | ICD-10-CM

## 2012-09-23 DIAGNOSIS — J329 Chronic sinusitis, unspecified: Secondary | ICD-10-CM

## 2012-09-23 MED ORDER — BECLOMETHASONE DIPROPIONATE 40 MCG/ACT IN AERS: 2.0000 | INHALATION_SPRAY | Freq: Two times a day (BID) | RESPIRATORY_TRACT | Status: DC

## 2012-09-23 NOTE — Telephone Encounter (Signed)
Rx not received by the pharmacy---Re-faxed the Qvar    KP

## 2012-09-24 ENCOUNTER — Encounter: Payer: Self-pay | Admitting: Family Medicine

## 2012-09-24 NOTE — Telephone Encounter (Signed)
Spoke with patient and she stated she is coughing more and more congested then she was before. NO fevers, no aches. She is taking her medications as prescribed. Please advise     KP

## 2012-09-25 MED ORDER — AMOXICILLIN-POT CLAVULANATE 875-125 MG PO TABS: 1.0000 | ORAL_TABLET | Freq: Two times a day (BID) | ORAL | Status: DC

## 2012-09-25 NOTE — Telephone Encounter (Signed)
I discussed with the patient and she voiced understansding. Rx faxed to [facility] [address]     KP

## 2012-10-07 ENCOUNTER — Encounter (HOSPITAL_COMMUNITY): Payer: Self-pay | Admitting: Emergency Medicine

## 2012-10-07 ENCOUNTER — Emergency Department (HOSPITAL_COMMUNITY)
Admission: EM | Admit: 2012-10-07 | Discharge: 2012-10-07 | Disposition: A | Discharge status: Home / Self Care | Payer: BC Managed Care – PPO | Source: Home / Self Care

## 2012-10-07 DIAGNOSIS — K602 Anal fissure, unspecified: Secondary | ICD-10-CM

## 2012-10-07 MED ORDER — LIDOCAINE HCL 2 % EX GEL: CUTANEOUS | Status: DC | PRN

## 2012-10-07 MED ORDER — DILTIAZEM GEL 2 %: 1.0000 "application " | Freq: Two times a day (BID) | CUTANEOUS | Status: DC

## 2012-10-07 NOTE — ED Provider Notes (Signed)
Medical screening examination/treatment/procedure(s) were performed by non-physician practitioner and as supervising physician I was immediately available for consultation/collaboration.  Kanani Mowbray   Nidal Rivet, MD 10/07/12 1338 

## 2012-10-07 NOTE — ED Provider Notes (Signed)
History     CSN: [id number]  Arrival date & time 10/07/12  1043   None     Chief Complaint  Patient presents with  . Hemorrhoids    (Consider location/radiation/quality/duration/timing/severity/associated sxs/prior treatment) HPI Comments: 37 year old female presents with the complaint of possible hemorrhoid. She is complaining of anal itching and soreness. She also complains of renal pain with bowel movement. She has seen scant blood upon wiping after a bowel movement. No other bright red bleeding. The couple weeks ago she had been treated with an antibiotic for rhonchi is and during that time had constipation and straining. That is when the symptoms began. She saw her doctor for that, was treated and all does symptoms have resolved. She has no other complaints and  review of systems are otherwise negative.   Past Medical History  Diagnosis Date  . Asthma   . Depression   . Syncope   . Migraines     Dr.Paul Daphine Deutscher in [place]  . PCOS (polycystic ovarian syndrome)     [name]    Past Surgical History  Procedure Date  . None     Family History  Problem Relation Age of Onset  . Breast cancer Mother 39  . Breast cancer  41    Maternal Cousin  . Diabetes Mother   . Diabetes Maternal Grandmother   . Diabetes Maternal Aunt   . Arthritis Maternal Grandmother   . Arthritis Mother   . Hyperlipidemia Paternal Aunt   . Hyperlipidemia Paternal Uncle   . Stroke Paternal Uncle   . Hypertension    . Kidney disease Paternal Uncle   . Depression    . Heart disease Paternal Uncle 57    Sudden death    History  Substance Use Topics  . Smoking status: Never Smoker   . Smokeless tobacco: Never Used  . Alcohol Use: No    OB History    Grav Para Term Preterm Abortions TAB SAB Ect Mult Living   0               Review of Systems  Constitutional: Negative.   HENT: Negative.   Respiratory: Negative.   Cardiovascular: Negative.   Gastrointestinal: Positive for blood  in stool and rectal pain. Negative for diarrhea.  Genitourinary: Negative.   Musculoskeletal: Negative.   Psychiatric/Behavioral: Negative.     Allergies  Codeine; Flagyl; Hydrocodone; and Oxycodone  Home Medications   Current Outpatient Rx  Name  Route  Sig  Dispense  Refill  . BECLOMETHASONE DIPROPIONATE 40 MCG/ACT IN AERS   Inhalation   Inhale 2 puffs into the lungs 2 (two) times daily.   1 Inhaler   6   . CYCLOBENZAPRINE HCL 5 MG PO TABS   Oral   Take 1 tablet by mouth Three times a day.         Marland Kitchen DICLOFENAC SODIUM 50 MG PO TBEC   Oral   Take 50 mg by mouth at bedtime.         . TOPIRAMATE 25 MG PO TABS   Oral   Take 50 mg by mouth daily.          . ALBUTEROL SULFATE HFA 108 (90 BASE) MCG/ACT IN AERS   Inhalation   Inhale 2 puffs into the lungs every 6 (six) hours as needed for wheezing.   1 Inhaler   1   . AMOXICILLIN-POT CLAVULANATE 875-125 MG PO TABS   Oral   Take 1 tablet by mouth  2 (two) times daily.   20 tablet   0   . AZITHROMYCIN 250 MG PO TABS      As directed   6 each   0   . DEXAMETHASONE 0.5 MG/5ML PO ELIX   Oral   Take 1 mg by mouth daily.         Marland Kitchen DILTIAZEM GEL 2 %   Topical   Apply 1 application topically 2 (two) times daily.   30 g   0   . LIDOCAINE HCL 2 % EX GEL   Topical   Apply topically as needed.   30 mL   0   . ONDANSETRON HCL 4 MG PO TABS   Oral   Take 1 tablet by mouth as needed.         Marland Kitchen PREDNISONE 10 MG PO TABS      3 po qd for 3 days then 2 po qd for 3 days the 1 po qd for 3 days   18 tablet   0   . IMITREX IJ   Injection   Inject 6 mg as directed 2 (two) times a week.             BP 116/85  Pulse 127  Temp 100.7 F (38.2 C) (Oral)  Resp 20  SpO2 100%  LMP 09/23/2012  Physical Exam  Nursing note and vitals reviewed. Constitutional: She is oriented to person, place, and time. She appears well-developed and well-nourished. No distress.  Eyes: Conjunctivae normal and EOM are normal.    Neck: Normal range of motion. Neck supple.  Cardiovascular: Normal rate and normal heart sounds.   Pulmonary/Chest: Effort normal and breath sounds normal. No respiratory distress. She has no wheezes. She has no rales.  Genitourinary:       Anorectal exam reveals approximately 1 cm superficial tear/fissure at 12:00 adjacent to the anus. There are no other lesions and no palpable lesions with digital exam. No evidence of hemorrhoids.  Musculoskeletal: Normal range of motion.  Neurological: She is alert and oriented to person, place, and time.  Skin: Skin is warm and dry.  Psychiatric: She has a normal mood and affect.    ED Course  Procedures (including critical care time)  Labs Reviewed - No data to display No results found.   1. Anal fissure       MDM  Discussed increasing fiber in diet as well as stool softeners such as DSS 100 mg 3 times a day. Warm sitz baths without using soap. Diltiazem jail small amount to the affected area twice a day 2% Xylocaine jail topically when necessary Neosporin ointment applied over the area after each bowel movement and 2-3 times during the day.        Hayden Rasmussen, NP 10/07/12 1241

## 2012-10-07 NOTE — ED Notes (Signed)
Pt c/o itchy and tenderness rectum x4 days States she was sick w/bronchitis x2 weeks ago and was constipated and straining Sx include: lump/bulge around anus, blood on the toilet paper and toilet Denies: f/v/n/d No hx of hemorrhoids  She is alert w/no signs of acute distress.

## 2012-10-14 ENCOUNTER — Emergency Department (HOSPITAL_COMMUNITY)
Admission: EM | Admit: 2012-10-14 | Discharge: 2012-10-14 | Disposition: A | Discharge status: Home / Self Care | Payer: BC Managed Care – PPO | Attending: Emergency Medicine | Admitting: Emergency Medicine

## 2012-10-14 ENCOUNTER — Encounter (HOSPITAL_COMMUNITY): Payer: Self-pay | Admitting: *Deleted

## 2012-10-14 DIAGNOSIS — Z8742 Personal history of other diseases of the female genital tract: Secondary | ICD-10-CM | POA: Insufficient documentation

## 2012-10-14 DIAGNOSIS — Z8659 Personal history of other mental and behavioral disorders: Secondary | ICD-10-CM | POA: Insufficient documentation

## 2012-10-14 DIAGNOSIS — Z3202 Encounter for pregnancy test, result negative: Secondary | ICD-10-CM | POA: Insufficient documentation

## 2012-10-14 DIAGNOSIS — J45909 Unspecified asthma, uncomplicated: Secondary | ICD-10-CM | POA: Insufficient documentation

## 2012-10-14 DIAGNOSIS — Z79899 Other long term (current) drug therapy: Secondary | ICD-10-CM | POA: Insufficient documentation

## 2012-10-14 DIAGNOSIS — G43909 Migraine, unspecified, not intractable, without status migrainosus: Secondary | ICD-10-CM | POA: Insufficient documentation

## 2012-10-14 DIAGNOSIS — R1110 Vomiting, unspecified: Secondary | ICD-10-CM

## 2012-10-14 DIAGNOSIS — R5381 Other malaise: Secondary | ICD-10-CM | POA: Insufficient documentation

## 2012-10-14 DIAGNOSIS — R112 Nausea with vomiting, unspecified: Secondary | ICD-10-CM | POA: Insufficient documentation

## 2012-10-14 DIAGNOSIS — R197 Diarrhea, unspecified: Secondary | ICD-10-CM | POA: Insufficient documentation

## 2012-10-14 LAB — URINALYSIS, ROUTINE W REFLEX MICROSCOPIC
Nitrite: NEGATIVE
Protein, ur: NEGATIVE mg/dL
Urobilinogen, UA: 0.2 mg/dL (ref 0.0–1.0)

## 2012-10-14 LAB — BASIC METABOLIC PANEL
Calcium: 8.5 mg/dL (ref 8.4–10.5)
GFR calc Af Amer: >90 mL/min (ref >90)
GFR calc non Af Amer: 84 mL/min — ABNORMAL LOW (ref >90)
Glucose, Bld: 110 mg/dL — ABNORMAL HIGH (ref 70–99)
Sodium: 134 mEq/L — ABNORMAL LOW (ref 135–145)

## 2012-10-14 LAB — URINE MICROSCOPIC-ADD ON

## 2012-10-14 LAB — PREGNANCY, URINE: Preg Test, Ur: NEGATIVE

## 2012-10-14 MED ORDER — PROMETHAZINE HCL 25 MG/ML IJ SOLN
25.0000 mg | Freq: Once | INTRAMUSCULAR | Status: AC
  Administered 2012-10-14: 25 mg via INTRAVENOUS
  Filled 2012-10-14: qty 1

## 2012-10-14 MED ORDER — ONDANSETRON HCL 4 MG/2ML IJ SOLN
4.0000 mg | Freq: Once | INTRAMUSCULAR | Status: AC
  Administered 2012-10-14: 4 mg via INTRAVENOUS
  Filled 2012-10-14: qty 2

## 2012-10-14 MED ORDER — PROMETHAZINE HCL 25 MG PO TABS: 25.0000 mg | ORAL_TABLET | Freq: Four times a day (QID) | ORAL | Status: DC | PRN

## 2012-10-14 MED ORDER — ONDANSETRON HCL 4 MG PO TABS: 4.0000 mg | ORAL_TABLET | Freq: Four times a day (QID) | ORAL | Status: DC

## 2012-10-14 MED ORDER — SODIUM CHLORIDE 0.9 % IV BOLUS (SEPSIS)
1000.0000 mL | Freq: Once | INTRAVENOUS | Status: AC
  Administered 2012-10-14: 1000 mL via INTRAVENOUS

## 2012-10-14 NOTE — ED Provider Notes (Signed)
History     CSN: [id number]  Arrival date & time 10/14/12  0807   First MD Initiated Contact with Patient 10/14/12 [phone]      Chief Complaint  Patient presents with  . Nausea  . Emesis  . Diarrhea    (Consider location/radiation/quality/duration/timing/severity/associated sxs/prior treatment) HPI Comments: Pt states that she started with multiple episodes of vomiting and diarrhea this morning:pt denies fever, cough or abdominal pain:pt states that she was unable to keep anything down:pt has not taken anything for nausea:pt states that she feels week  The history is provided by the patient. No language interpreter was used.    Past Medical History  Diagnosis Date  . Asthma   . Depression   . Syncope   . Migraines     Dr.Paul Daphine Deutscher in [place]  . PCOS (polycystic ovarian syndrome)     [name]    Past Surgical History  Procedure Date  . None     Family History  Problem Relation Age of Onset  . Breast cancer Mother 29  . Breast cancer  41    Maternal Cousin  . Diabetes Mother   . Diabetes Maternal Grandmother   . Diabetes Maternal Aunt   . Arthritis Maternal Grandmother   . Arthritis Mother   . Hyperlipidemia Paternal Aunt   . Hyperlipidemia Paternal Uncle   . Stroke Paternal Uncle   . Hypertension    . Kidney disease Paternal Uncle   . Depression    . Heart disease Paternal Uncle 26    Sudden death    History  Substance Use Topics  . Smoking status: Never Smoker   . Smokeless tobacco: Never Used  . Alcohol Use: No    OB History    Grav Para Term Preterm Abortions TAB SAB Ect Mult Living   0               Review of Systems  Constitutional: Negative.   Respiratory: Negative.   Cardiovascular: Negative.     Allergies  Codeine; Flagyl; Hydrocodone; and Oxycodone  Home Medications   Current Outpatient Rx  Name  Route  Sig  Dispense  Refill  . BECLOMETHASONE DIPROPIONATE 40 MCG/ACT IN AERS   Inhalation   Inhale 2 puffs into the lungs 2  (two) times daily.   1 Inhaler   6   . CYCLOBENZAPRINE HCL 5 MG PO TABS   Oral   Take 5 mg by mouth 3 (three) times daily as needed. For muscle spasms.         . DICLOFENAC SODIUM 50 MG PO TBEC   Oral   Take 50 mg by mouth at bedtime.         Marland Kitchen DILTIAZEM GEL 2 %   Topical   Apply 1 application topically 2 (two) times daily.   30 g   0   . LIDOCAINE HCL 2 % EX GEL   Topical   Apply topically as needed.   30 mL   0   . IMITREX IJ   Injection   Inject 6 mg as directed as needed. For migraines.         . TOPIRAMATE 25 MG PO TABS   Oral   Take 50 mg by mouth daily.          . ALBUTEROL SULFATE HFA 108 (90 BASE) MCG/ACT IN AERS   Inhalation   Inhale 2 puffs into the lungs every 6 (six) hours as needed for wheezing.  1 Inhaler   1     BP 141/85  Pulse 128  Temp 99 F (37.2 C) (Oral)  Resp 18  SpO2 100%  LMP 09/23/2012  Physical Exam  Nursing note and vitals reviewed. Constitutional: She is oriented to person, place, and time. She appears well-developed and well-nourished.  HENT:  Head: Normocephalic and atraumatic.  Cardiovascular: Normal rate and regular rhythm.   Pulmonary/Chest: Effort normal and breath sounds normal.  Abdominal: Soft. Bowel sounds are normal. There is no tenderness.  Musculoskeletal: Normal range of motion.  Neurological: She is alert and oriented to person, place, and time.  Skin: Skin is warm and dry.  Psychiatric: She has a normal mood and affect.    ED Course  Procedures (including critical care time)  Labs Reviewed  BASIC METABOLIC PANEL - Abnormal; Notable for the following:    Sodium 134 (*)     Glucose, Bld 110 (*)     GFR calc non Af Amer 84 (*)     All other components within normal limits  URINALYSIS, ROUTINE W REFLEX MICROSCOPIC - Abnormal; Notable for the following:    Hgb urine dipstick TRACE (*)     All other components within normal limits  PREGNANCY, URINE  URINE MICROSCOPIC-ADD ON   No results  found.   1. Vomiting and diarrhea       MDM  Pt is tolerating po without any problem:pt is feeling better at this time:symptoms likely viral:will send home on something for vomiting        Teressa Lower, NP 10/14/12 1246

## 2012-10-14 NOTE — ED Notes (Signed)
Pt c/o n/v/d since last night. Denies abd pain. Unable to tolerate fluids.

## 2012-10-14 NOTE — ED Notes (Signed)
Pt escorted to discharge window. Pt verbalized understanding discharge instructions. In no acute distress.  

## 2012-10-15 NOTE — ED Provider Notes (Signed)
Medical screening examination/treatment/procedure(s) were performed by non-physician practitioner and as supervising physician I was immediately available for consultation/collaboration.  Dezyrae Kensinger T Eliz Nigg, MD 10/15/12 0904 

## 2012-10-31 ENCOUNTER — Other Ambulatory Visit: Payer: Self-pay

## 2013-02-18 ENCOUNTER — Telehealth: Payer: Self-pay

## 2013-02-18 NOTE — Telephone Encounter (Signed)
Apt scheudled 03/05/13 at 3 pm     KP

## 2013-02-18 NOTE — Telephone Encounter (Signed)
Message copied by Arnette Norris on Thu Feb 18, 2013 11:32 AM ------      Message from: Lelon Perla      Created: Thu Feb 18, 2013  9:45 AM      Regarding: FW: CPE Request      Contact: [phone]       Can this be done      ----- Message -----         From: Julieanne Manson         Sent: 02/16/2013   3:31 PM           To: Lelon Perla, DO      Subject: CPE Request                                              Dr. Laury Axon,            This patient called and says that her insurance is about to run out on 03/15/2013. She wants to get in one last physical before she is un-insured and wants to know if you can accommodate her. Advised her that there was no more availability for a CPE this month at this time on your schedule.      Do you want to work her in?            Thanks,        Adero       ------

## 2013-03-05 ENCOUNTER — Ambulatory Visit (INDEPENDENT_AMBULATORY_CARE_PROVIDER_SITE_OTHER): Payer: BC Managed Care – PPO | Admitting: Family Medicine

## 2013-03-05 ENCOUNTER — Encounter: Payer: Self-pay | Admitting: Family Medicine

## 2013-03-05 VITALS — BP 121/88 | HR 125 | Temp 99.3°F | Ht 64.0 in | Wt 239.0 lb

## 2013-03-05 DIAGNOSIS — J45901 Unspecified asthma with (acute) exacerbation: Secondary | ICD-10-CM

## 2013-03-05 DIAGNOSIS — G43909 Migraine, unspecified, not intractable, without status migrainosus: Secondary | ICD-10-CM

## 2013-03-05 DIAGNOSIS — I10 Essential (primary) hypertension: Secondary | ICD-10-CM

## 2013-03-05 DIAGNOSIS — Z0000 Encounter for general adult medical examination without abnormal findings: Secondary | ICD-10-CM

## 2013-03-05 DIAGNOSIS — J4521 Mild intermittent asthma with (acute) exacerbation: Secondary | ICD-10-CM

## 2013-03-05 DIAGNOSIS — B354 Tinea corporis: Secondary | ICD-10-CM

## 2013-03-05 MED ORDER — NYSTATIN 100000 UNIT/GM EX POWD: Freq: Four times a day (QID) | CUTANEOUS | Status: DC

## 2013-03-05 MED ORDER — ALBUTEROL SULFATE HFA 108 (90 BASE) MCG/ACT IN AERS: 2.0000 | INHALATION_SPRAY | Freq: Four times a day (QID) | RESPIRATORY_TRACT | Status: DC | PRN

## 2013-03-05 NOTE — Assessment & Plan Note (Signed)
con't meds stable 

## 2013-03-05 NOTE — Progress Notes (Signed)
Subjective:     Stacy Gentry is a 37 y.o. female and is here for a comprehensive physical exam. The patient reports no problems.  History   Social History  . Marital Status: Single    Spouse Name: N/A    Number of Children: N/A  . Years of Education: N/A   Occupational History  . Teacher, music for [employer]    Social History Main Topics  . Smoking status: Never Smoker   . Smokeless tobacco: Never Used  . Alcohol Use: No  . Drug Use: No  . Sexually Active: Not Currently -- Female partner(s)    Birth Control/ Protection: Pill   Other Topics Concern  . Not on file   Social History Narrative   Lives alone.   Exercise--no   Health Maintenance  Topic Date Due  . Influenza Vaccine  05/17/2013  . Pap Smear  04/24/2014  . Tetanus/tdap  11/21/2016    The following portions of the patient's history were reviewed and updated as appropriate:  She  has a past medical history of Asthma; Depression; Syncope; Migraines; and PCOS (polycystic ovarian syndrome). She  does not have any pertinent problems on file. She  has past surgical history that includes None. Her family history includes Arthritis in her maternal grandmother and mother; Breast cancer (age of onset: 77) in an unspecified family member; Breast cancer (age of onset: 74) in her mother; Depression in an unspecified family member; Diabetes in her maternal aunt, maternal grandmother, and mother; Heart disease (age of onset: 55) in her paternal uncle; Hyperlipidemia in her paternal aunt and paternal uncle; Hypertension in an unspecified family member; Kidney disease in her paternal uncle; and Stroke in her paternal uncle. She  reports that she has never smoked. She has never used smokeless tobacco. She reports that she does not drink alcohol or use illicit drugs. She has a current medication list which includes the following prescription(s): beclomethasone, cyclobenzaprine, diclofenac, sumatriptan  succinate, topiramate, and albuterol. Current Outpatient Prescriptions on File Prior to Visit  Medication Sig Dispense Refill  . beclomethasone (QVAR) 40 MCG/ACT inhaler Inhale 2 puffs into the lungs 2 (two) times daily.  1 Inhaler  6  . cyclobenzaprine (FLEXERIL) 5 MG tablet Take 5 mg by mouth 3 (three) times daily as needed. For muscle spasms.      . diclofenac (VOLTAREN) 50 MG EC tablet Take 50 mg by mouth at bedtime.      . SUMAtriptan Succinate (IMITREX IJ) Inject 6 mg as directed as needed. For migraines.      . topiramate (TOPAMAX) 25 MG tablet Take 50 mg by mouth daily.        No current facility-administered medications on file prior to visit.   She is allergic to codeine; flagyl; hydrocodone; and oxycodone..  Review of Systems Review of Systems  Constitutional: Negative for activity change, appetite change and fatigue.  HENT: Negative for hearing loss, congestion, tinnitus and ear discharge.  dentist q61m Eyes: Negative for visual disturbance (see optho q1y -- vision corrected to 20/20 with glasses).  Respiratory: Negative for cough, chest tightness and shortness of breath.   Cardiovascular: Negative for chest pain, palpitations and leg swelling.  Gastrointestinal: Negative for abdominal pain, diarrhea, constipation and abdominal distention.  Genitourinary: Negative for urgency, frequency, decreased urine volume and difficulty urinating.  Musculoskeletal: Negative for back pain, arthralgias and gait problem.  Skin: Negative for color change, pallor and rash.  Neurological: Negative for dizziness, light-headedness, numbness and headaches.  Hematological:  Negative for adenopathy. Does not bruise/bleed easily.  Psychiatric/Behavioral: Negative for suicidal ideas, confusion, sleep disturbance, self-injury, dysphoric mood, decreased concentration and agitation.       Objective:    BP 121/88  Pulse 125  Temp(Src) 99.3 F (37.4 C) (Oral)  Ht 5\' 4"  (1.626 m)  Wt 239 lb (108.41  kg)  BMI 41 kg/m2  SpO2 98%  LMP 02/15/2013 General appearance: alert, cooperative, appears stated age and no distress Head: Normocephalic, without obvious abnormality, atraumatic Eyes: conjunctivae/corneas clear. PERRL, EOM's intact. Fundi benign. Ears: normal TM's and external ear canals both ears Nose: Nares normal. Septum midline. Mucosa normal. No drainage or sinus tenderness. Throat: lips, mucosa, and tongue normal; teeth and gums normal Neck: no adenopathy, no carotid bruit, no JVD, supple, symmetrical, trachea midline and thyroid not enlarged, symmetric, no tenderness/mass/nodules Back: symmetric, no curvature. ROM normal. No CVA tenderness. Lungs: clear to auscultation bilaterally Breasts: normal appearance, no masses or tenderness Heart: regular rate and rhythm, S1, S2 normal, no murmur, click, rub or gallop Abdomen: soft, non-tender; bowel sounds normal; no masses,  no organomegaly Pelvic: deferred-gyn Extremities: extremities normal, atraumatic, no cyanosis or edema Pulses: 2+ and symmetric Skin: tinea rash under breasts Lymph nodes: Cervical, supraclavicular, and axillary nodes normal. Neurologic: Alert and oriented X 3, normal strength and tone. Normal symmetric reflexes. Normal coordination and gait Psych- no depression, no anxiety      Assessment:    Healthy female exam.     Plan:    ghm utd Check labs See After Visit Summary for Counseling Recommendations

## 2013-03-05 NOTE — Assessment & Plan Note (Signed)
Stable con't meds 

## 2013-03-05 NOTE — Assessment & Plan Note (Signed)
Stable  con't inhalers 

## 2013-03-05 NOTE — Patient Instructions (Addendum)
Preventive Care for Adults, Female A healthy lifestyle and preventive care can promote health and wellness. Preventive health guidelines for women include the following key practices.  A routine yearly physical is a good way to check with your caregiver about your health and preventive screening. It is a chance to share any concerns and updates on your health, and to receive a thorough exam.  Visit your dentist for a routine exam and preventive care every 6 months. Brush your teeth twice a day and floss once a day. Good oral hygiene prevents tooth decay and gum disease.  The frequency of eye exams is based on your age, health, family medical history, use of contact lenses, and other factors. Follow your caregiver's recommendations for frequency of eye exams.  Eat a healthy diet. Foods like vegetables, fruits, whole grains, low-fat dairy products, and lean protein foods contain the nutrients you need without too many calories. Decrease your intake of foods high in solid fats, added sugars, and salt. Eat the right amount of calories for you.Get information about a proper diet from your caregiver, if necessary.  Regular physical exercise is one of the most important things you can do for your health. Most adults should get at least 150 minutes of moderate-intensity exercise (any activity that increases your heart rate and causes you to sweat) each week. In addition, most adults need muscle-strengthening exercises on 2 or more days a week.  Maintain a healthy weight. The body mass index (BMI) is a screening tool to identify possible weight problems. It provides an estimate of body fat based on height and weight. Your caregiver can help determine your BMI, and can help you achieve or maintain a healthy weight.For adults 20 years and older:  A BMI below 18.5 is considered underweight.  A BMI of 18.5 to 24.9 is normal.  A BMI of 25 to 29.9 is considered overweight.  A BMI of 30 and above is  considered obese.  Maintain normal blood lipids and cholesterol levels by exercising and minimizing your intake of saturated fat. Eat a balanced diet with plenty of fruit and vegetables. Blood tests for lipids and cholesterol should begin at age 20 and be repeated every 5 years. If your lipid or cholesterol levels are high, you are over 50, or you are at high risk for heart disease, you may need your cholesterol levels checked more frequently.Ongoing high lipid and cholesterol levels should be treated with medicines if diet and exercise are not effective.  If you smoke, find out from your caregiver how to quit. If you do not use tobacco, do not start.  If you are pregnant, do not drink alcohol. If you are breastfeeding, be very cautious about drinking alcohol. If you are not pregnant and choose to drink alcohol, do not exceed 1 drink per day. One drink is considered to be 12 ounces (355 mL) of beer, 5 ounces (148 mL) of wine, or 1.5 ounces (44 mL) of liquor.  Avoid use of street drugs. Do not share needles with anyone. Ask for help if you need support or instructions about stopping the use of drugs.  High blood pressure causes heart disease and increases the risk of stroke. Your blood pressure should be checked at least every 1 to 2 years. Ongoing high blood pressure should be treated with medicines if weight loss and exercise are not effective.  If you are 55 to 37 years old, ask your caregiver if you should take aspirin to prevent strokes.  Diabetes   screening involves taking a blood sample to check your fasting blood sugar level. This should be done once every 3 years, after age 45, if you are within normal weight and without risk factors for diabetes. Testing should be considered at a younger age or be carried out more frequently if you are overweight and have at least 1 risk factor for diabetes.  Breast cancer screening is essential preventive care for women. You should practice "breast  self-awareness." This means understanding the normal appearance and feel of your breasts and may include breast self-examination. Any changes detected, no matter how small, should be reported to a caregiver. Women in their 20s and 30s should have a clinical breast exam (CBE) by a caregiver as part of a regular health exam every 1 to 3 years. After age 40, women should have a CBE every year. Starting at age 40, women should consider having a mammography (breast X-ray test) every year. Women who have a family history of breast cancer should talk to their caregiver about genetic screening. Women at a high risk of breast cancer should talk to their caregivers about having magnetic resonance imaging (MRI) and a mammography every year.  The Pap test is a screening test for cervical cancer. A Pap test can show cell changes on the cervix that might become cervical cancer if left untreated. A Pap test is a procedure in which cells are obtained and examined from the lower end of the uterus (cervix).  Women should have a Pap test starting at age 21.  Between ages 21 and 29, Pap tests should be repeated every 2 years.  Beginning at age 30, you should have a Pap test every 3 years as long as the past 3 Pap tests have been normal.  Some women have medical problems that increase the chance of getting cervical cancer. Talk to your caregiver about these problems. It is especially important to talk to your caregiver if a new problem develops soon after your last Pap test. In these cases, your caregiver may recommend more frequent screening and Pap tests.  The above recommendations are the same for women who have or have not gotten the vaccine for human papillomavirus (HPV).  If you had a hysterectomy for a problem that was not cancer or a condition that could lead to cancer, then you no longer need Pap tests. Even if you no longer need a Pap test, a regular exam is a good idea to make sure no other problems are  starting.  If you are between ages 65 and 70, and you have had normal Pap tests going back 10 years, you no longer need Pap tests. Even if you no longer need a Pap test, a regular exam is a good idea to make sure no other problems are starting.  If you have had past treatment for cervical cancer or a condition that could lead to cancer, you need Pap tests and screening for cancer for at least 20 years after your treatment.  If Pap tests have been discontinued, risk factors (such as a new sexual partner) need to be reassessed to determine if screening should be resumed.  The HPV test is an additional test that may be used for cervical cancer screening. The HPV test looks for the virus that can cause the cell changes on the cervix. The cells collected during the Pap test can be tested for HPV. The HPV test could be used to screen women aged 30 years and older, and should   be used in women of any age who have unclear Pap test results. After the age of 30, women should have HPV testing at the same frequency as a Pap test.  Colorectal cancer can be detected and often prevented. Most routine colorectal cancer screening begins at the age of 50 and continues through age 75. However, your caregiver may recommend screening at an earlier age if you have risk factors for colon cancer. On a yearly basis, your caregiver may provide home test kits to check for hidden blood in the stool. Use of a small camera at the end of a tube, to directly examine the colon (sigmoidoscopy or colonoscopy), can detect the earliest forms of colorectal cancer. Talk to your caregiver about this at age 50, when routine screening begins. Direct examination of the colon should be repeated every 5 to 10 years through age 75, unless early forms of pre-cancerous polyps or small growths are found.  Hepatitis C blood testing is recommended for all people born from 1945 through 1965 and any individual with known risks for hepatitis C.  Practice  safe sex. Use condoms and avoid high-risk sexual practices to reduce the spread of sexually transmitted infections (STIs). STIs include gonorrhea, chlamydia, syphilis, trichomonas, herpes, HPV, and human immunodeficiency virus (HIV). Herpes, HIV, and HPV are viral illnesses that have no cure. They can result in disability, cancer, and death. Sexually active women aged 25 and younger should be checked for chlamydia. Older women with new or multiple partners should also be tested for chlamydia. Testing for other STIs is recommended if you are sexually active and at increased risk.  Osteoporosis is a disease in which the bones lose minerals and strength with aging. This can result in serious bone fractures. The risk of osteoporosis can be identified using a bone density scan. Women ages 65 and over and women at risk for fractures or osteoporosis should discuss screening with their caregivers. Ask your caregiver whether you should take a calcium supplement or vitamin D to reduce the rate of osteoporosis.  Menopause can be associated with physical symptoms and risks. Hormone replacement therapy is available to decrease symptoms and risks. You should talk to your caregiver about whether hormone replacement therapy is right for you.  Use sunscreen with sun protection factor (SPF) of 30 or more. Apply sunscreen liberally and repeatedly throughout the day. You should seek shade when your shadow is shorter than you. Protect yourself by wearing long sleeves, pants, a wide-brimmed hat, and sunglasses year round, whenever you are outdoors.  Once a month, do a whole body skin exam, using a mirror to look at the skin on your back. Notify your caregiver of new moles, moles that have irregular borders, moles that are larger than a pencil eraser, or moles that have changed in shape or color.  Stay current with required immunizations.  Influenza. You need a dose every fall (or winter). The composition of the flu vaccine  changes each year, so being vaccinated once is not enough.  Pneumococcal polysaccharide. You need 1 to 2 doses if you smoke cigarettes or if you have certain chronic medical conditions. You need 1 dose at age 65 (or older) if you have never been vaccinated.  Tetanus, diphtheria, pertussis (Tdap, Td). Get 1 dose of Tdap vaccine if you are younger than age 65, are over 65 and have contact with an infant, are a healthcare worker, are pregnant, or simply want to be protected from whooping cough. After that, you need a Td   booster dose every 10 years. Consult your caregiver if you have not had at least 3 tetanus and diphtheria-containing shots sometime in your life or have a deep or dirty wound.  HPV. You need this vaccine if you are a woman age 26 or younger. The vaccine is given in 3 doses over 6 months.  Measles, mumps, rubella (MMR). You need at least 1 dose of MMR if you were born in 1957 or later. You may also need a second dose.  Meningococcal. If you are age 19 to 21 and a first-year college student living in a residence hall, or have one of several medical conditions, you need to get vaccinated against meningococcal disease. You may also need additional booster doses.  Zoster (shingles). If you are age 60 or older, you should get this vaccine.  Varicella (chickenpox). If you have never had chickenpox or you were vaccinated but received only 1 dose, talk to your caregiver to find out if you need this vaccine.  Hepatitis A. You need this vaccine if you have a specific risk factor for hepatitis A virus infection or you simply wish to be protected from this disease. The vaccine is usually given as 2 doses, 6 to 18 months apart.  Hepatitis B. You need this vaccine if you have a specific risk factor for hepatitis B virus infection or you simply wish to be protected from this disease. The vaccine is given in 3 doses, usually over 6 months. Preventive Services / Frequency Ages 19 to 39  Blood  pressure check.** / Every 1 to 2 years.  Lipid and cholesterol check.** / Every 5 years beginning at age 20.  Clinical breast exam.** / Every 3 years for women in their 20s and 30s.  Pap test.** / Every 2 years from ages 21 through 29. Every 3 years starting at age 30 through age 65 or 70 with a history of 3 consecutive normal Pap tests.  HPV screening.** / Every 3 years from ages 30 through ages 65 to 70 with a history of 3 consecutive normal Pap tests.  Hepatitis C blood test.** / For any individual with known risks for hepatitis C.  Skin self-exam. / Monthly.  Influenza immunization.** / Every year.  Pneumococcal polysaccharide immunization.** / 1 to 2 doses if you smoke cigarettes or if you have certain chronic medical conditions.  Tetanus, diphtheria, pertussis (Tdap, Td) immunization. / A one-time dose of Tdap vaccine. After that, you need a Td booster dose every 10 years.  HPV immunization. / 3 doses over 6 months, if you are 26 and younger.  Measles, mumps, rubella (MMR) immunization. / You need at least 1 dose of MMR if you were born in 1957 or later. You may also need a second dose.  Meningococcal immunization. / 1 dose if you are age 19 to 21 and a first-year college student living in a residence hall, or have one of several medical conditions, you need to get vaccinated against meningococcal disease. You may also need additional booster doses.  Varicella immunization.** / Consult your caregiver.  Hepatitis A immunization.** / Consult your caregiver. 2 doses, 6 to 18 months apart.  Hepatitis B immunization.** / Consult your caregiver. 3 doses usually over 6 months. Ages 40 to 64  Blood pressure check.** / Every 1 to 2 years.  Lipid and cholesterol check.** / Every 5 years beginning at age 20.  Clinical breast exam.** / Every year after age 40.  Mammogram.** / Every year beginning at age 40   and continuing for as long as you are in good health. Consult with your  caregiver.  Pap test.** / Every 3 years starting at age 30 through age 65 or 70 with a history of 3 consecutive normal Pap tests.  HPV screening.** / Every 3 years from ages 30 through ages 65 to 70 with a history of 3 consecutive normal Pap tests.  Fecal occult blood test (FOBT) of stool. / Every year beginning at age 50 and continuing until age 75. You may not need to do this test if you get a colonoscopy every 10 years.  Flexible sigmoidoscopy or colonoscopy.** / Every 5 years for a flexible sigmoidoscopy or every 10 years for a colonoscopy beginning at age 50 and continuing until age 75.  Hepatitis C blood test.** / For all people born from 1945 through 1965 and any individual with known risks for hepatitis C.  Skin self-exam. / Monthly.  Influenza immunization.** / Every year.  Pneumococcal polysaccharide immunization.** / 1 to 2 doses if you smoke cigarettes or if you have certain chronic medical conditions.  Tetanus, diphtheria, pertussis (Tdap, Td) immunization.** / A one-time dose of Tdap vaccine. After that, you need a Td booster dose every 10 years.  Measles, mumps, rubella (MMR) immunization. / You need at least 1 dose of MMR if you were born in 1957 or later. You may also need a second dose.  Varicella immunization.** / Consult your caregiver.  Meningococcal immunization.** / Consult your caregiver.  Hepatitis A immunization.** / Consult your caregiver. 2 doses, 6 to 18 months apart.  Hepatitis B immunization.** / Consult your caregiver. 3 doses, usually over 6 months. Ages 65 and over  Blood pressure check.** / Every 1 to 2 years.  Lipid and cholesterol check.** / Every 5 years beginning at age 20.  Clinical breast exam.** / Every year after age 40.  Mammogram.** / Every year beginning at age 40 and continuing for as long as you are in good health. Consult with your caregiver.  Pap test.** / Every 3 years starting at age 30 through age 65 or 70 with a 3  consecutive normal Pap tests. Testing can be stopped between 65 and 70 with 3 consecutive normal Pap tests and no abnormal Pap or HPV tests in the past 10 years.  HPV screening.** / Every 3 years from ages 30 through ages 65 or 70 with a history of 3 consecutive normal Pap tests. Testing can be stopped between 65 and 70 with 3 consecutive normal Pap tests and no abnormal Pap or HPV tests in the past 10 years.  Fecal occult blood test (FOBT) of stool. / Every year beginning at age 50 and continuing until age 75. You may not need to do this test if you get a colonoscopy every 10 years.  Flexible sigmoidoscopy or colonoscopy.** / Every 5 years for a flexible sigmoidoscopy or every 10 years for a colonoscopy beginning at age 50 and continuing until age 75.  Hepatitis C blood test.** / For all people born from 1945 through 1965 and any individual with known risks for hepatitis C.  Osteoporosis screening.** / A one-time screening for women ages 65 and over and women at risk for fractures or osteoporosis.  Skin self-exam. / Monthly.  Influenza immunization.** / Every year.  Pneumococcal polysaccharide immunization.** / 1 dose at age 65 (or older) if you have never been vaccinated.  Tetanus, diphtheria, pertussis (Tdap, Td) immunization. / A one-time dose of Tdap vaccine if you are over   65 and have contact with an infant, are a healthcare worker, or simply want to be protected from whooping cough. After that, you need a Td booster dose every 10 years.  Varicella immunization.** / Consult your caregiver.  Meningococcal immunization.** / Consult your caregiver.  Hepatitis A immunization.** / Consult your caregiver. 2 doses, 6 to 18 months apart.  Hepatitis B immunization.** / Check with your caregiver. 3 doses, usually over 6 months. ** Family history and personal history of risk and conditions may change your caregiver's recommendations. Document Released: 10/29/2001 Document Revised: 11/25/2011  Document Reviewed: 01/28/2011 [employer] Patient Information 2014 [employer].  

## 2013-03-08 ENCOUNTER — Other Ambulatory Visit: Payer: BC Managed Care – PPO

## 2013-05-27 ENCOUNTER — Encounter (HOSPITAL_COMMUNITY): Payer: Self-pay | Admitting: Emergency Medicine

## 2013-05-27 ENCOUNTER — Emergency Department (HOSPITAL_COMMUNITY)
Admission: EM | Admit: 2013-05-27 | Discharge: 2013-05-27 | Disposition: A | Discharge status: Home / Self Care | Payer: BC Managed Care – PPO | Source: Home / Self Care | Attending: Emergency Medicine | Admitting: Emergency Medicine

## 2013-05-27 DIAGNOSIS — K610 Anal abscess: Secondary | ICD-10-CM

## 2013-05-27 DIAGNOSIS — K612 Anorectal abscess: Secondary | ICD-10-CM

## 2013-05-27 MED ORDER — AMOXICILLIN-POT CLAVULANATE 875-125 MG PO TABS: 1.0000 | ORAL_TABLET | Freq: Two times a day (BID) | ORAL | Status: DC

## 2013-05-27 MED ORDER — TRAMADOL HCL 50 MG PO TABS: 100.0000 mg | ORAL_TABLET | Freq: Three times a day (TID) | ORAL | Status: DC | PRN

## 2013-05-27 NOTE — ED Notes (Signed)
C/o boil on buttocks which was seen today

## 2013-05-27 NOTE — ED Provider Notes (Signed)
Chief Complaint:   Chief Complaint  Patient presents with  . Recurrent Skin Infections    History of Present Illness:    Stacy Gentry is a 36 year old female with a two-day history of an abscess in her left perianal area, just lateral to the anus. It's not draining any pus she denies any fever or chills. She had one that same place several years ago. She denies any other history of abscesses or skin infections and she's had no history of MRSA.  Review of Systems:  Other than noted above, the patient denies any of the following symptoms: Systemic:  No fever, chills or sweats. Skin:  No rash or itching.  PMFSH:  Past medical history, family history, social history, meds, and allergies were reviewed.  No history of diabetes or prior history of abscesses or MRSA. She's allergic to hydrocodone which she can take tramadol. She takes Topamax and diclofenac for migraines and degenerative disc disease respectively. She also has asthma and takes Qvar and them on for that.  Physical Exam:   Vital signs:  BP 92/58  Pulse 119  Temp(Src) 98.3 F (36.8 C) (Oral)  Resp 18  SpO2 98%  LMP 05/18/2013 Skin:  Exam of the anal area reveals a fluctuant, tender mass, just to the left of the anus. This was not draining any pus.  Skin exam was otherwise normal.  No rash. Ext:  Distal pulses were full, patient has full ROM of all joints.  Procedure:  Verbal informed consent was obtained.  The patient was informed of the risks and benefits of the procedure and understands and accepts.  Identity of the patient was verified verbally and by wristband.   The abscess area described above was prepped with Betadine and alcohol and anesthetized with 5 mL of 2% Xylocaine with epinephrine.  Using a #11 scalpel blade, a singe straight incision was made into the area of fluctulence, yielding a moderate amount of prurulent drainage.  Routine cultures were obtained.  Blunt dissection was used to break up loculations and the  resulting wound cavity was packed with 1/4 inch Iodoform gauze.  A sterile pressure dressing was applied.  Assessment:  The encounter diagnosis was Perianal abscess.  Plan:   1.  Meds:  The following meds were prescribed:   Discharge Medication List as of 05/27/2013  5:46 PM    START taking these medications   Details  amoxicillin-clavulanate (AUGMENTIN) 875-125 MG per tablet Take 1 tablet by mouth 2 (two) times daily., Starting 05/27/2013, Until Discontinued, Normal    traMADol (ULTRAM) 50 MG tablet Take 2 tablets (100 mg total) by mouth every 8 (eight) hours as needed for pain., Starting 05/27/2013, Until Discontinued, Normal        2.  Patient Education/Counseling:  The patient was given appropriate handouts, self care instructions, and instructed in symptomatic relief.  She should leave the dressing in place if possible, and return in 48 hours. If not she was instructed to replace it with a gauze dressing. After she's come back for followup she can start hot sitz baths twice a day.  3.  Follow up:  The patient was instructed to leave the dressing in place and return again in 48 hours for packing removal, if becoming worse in any way, and given some red flag symptoms such as fever or worsening pain which would prompt immediate return.  Follow up here in 48 hours.     Reuben Likes, MD 05/27/13 [id number]

## 2013-05-30 LAB — CULTURE, ROUTINE-ABSCESS

## 2013-06-10 ENCOUNTER — Encounter (HOSPITAL_COMMUNITY): Payer: Self-pay | Admitting: Emergency Medicine

## 2013-06-10 ENCOUNTER — Emergency Department (INDEPENDENT_AMBULATORY_CARE_PROVIDER_SITE_OTHER)
Admission: EM | Admit: 2013-06-10 | Discharge: 2013-06-10 | Disposition: A | Discharge status: Home / Self Care | Payer: BC Managed Care – PPO | Source: Home / Self Care

## 2013-06-10 DIAGNOSIS — L290 Pruritus ani: Secondary | ICD-10-CM

## 2013-06-10 MED ORDER — HYDROCORTISONE 2.5 % RE CREA: TOPICAL_CREAM | Freq: Two times a day (BID) | RECTAL | Status: DC

## 2013-06-10 NOTE — ED Provider Notes (Signed)
CSN: [id number]     Arrival date & time 06/10/13  1821 History   None    Chief Complaint  Patient presents with  . Follow-up   (Consider location/radiation/quality/duration/timing/severity/associated sxs/prior Treatment) Patient is a 37 y.o. female presenting with rash. The history is provided by the patient.  Rash Pain quality: burning   Pain radiates to:  Does not radiate Pain severity:  No pain Progression:  Worsening Chronicity:  New Context comment:  Itching and now intense burning of perianal skin s/p i+d of abscess, no drainage.   Past Medical History  Diagnosis Date  . Asthma   . Depression   . Syncope   . Migraines     Dr.Paul Daphine Deutscher in [place]  . PCOS (polycystic ovarian syndrome)     [name]   Past Surgical History  Procedure Laterality Date  . None     Family History  Problem Relation Age of Onset  . Breast cancer Mother 84  . Breast cancer  41    Maternal Cousin  . Diabetes Mother   . Diabetes Maternal Grandmother   . Diabetes Maternal Aunt   . Arthritis Maternal Grandmother   . Arthritis Mother   . Hyperlipidemia Paternal Aunt   . Hyperlipidemia Paternal Uncle   . Stroke Paternal Uncle   . Hypertension    . Kidney disease Paternal Uncle   . Depression    . Heart disease Paternal Uncle 80    Sudden death   History  Substance Use Topics  . Smoking status: Never Smoker   . Smokeless tobacco: Never Used  . Alcohol Use: No   OB History   Grav Para Term Preterm Abortions TAB SAB Ect Mult Living   0              Review of Systems  Constitutional: Negative.   Skin: Positive for rash.    Allergies  Codeine; Flagyl; Hydrocodone; and Oxycodone  Home Medications   Current Outpatient Rx  Name  Route  Sig  Dispense  Refill  . amoxicillin-clavulanate (AUGMENTIN) 875-125 MG per tablet   Oral   Take 1 tablet by mouth 2 (two) times daily.   20 tablet   0   . cyclobenzaprine (FLEXERIL) 5 MG tablet   Oral   Take 5 mg by mouth 3 (three)  times daily as needed. For muscle spasms.         . diclofenac (VOLTAREN) 50 MG EC tablet   Oral   Take 50 mg by mouth at bedtime.         . topiramate (TOPAMAX) 25 MG tablet   Oral   Take 50 mg by mouth daily.          Marland Kitchen albuterol (VENTOLIN HFA) 108 (90 BASE) MCG/ACT inhaler   Inhalation   Inhale 2 puffs into the lungs every 6 (six) hours as needed for wheezing.   1 Inhaler   1   . beclomethasone (QVAR) 40 MCG/ACT inhaler   Inhalation   Inhale 2 puffs into the lungs 2 (two) times daily.   1 Inhaler   6   . hydrocortisone (ANUSOL-HC) 2.5 % rectal cream   Rectal   Place rectally 2 (two) times daily.   30 g   1   . nystatin (MYCOSTATIN) powder   Topical   Apply topically 4 (four) times daily.   15 g   0   . SUMAtriptan Succinate (IMITREX IJ)   Injection   Inject 6 mg as  directed as needed. For migraines.         . traMADol (ULTRAM) 50 MG tablet   Oral   Take 2 tablets (100 mg total) by mouth every 8 (eight) hours as needed for pain.   30 tablet   0    BP 130/76  Pulse 120  Temp(Src) 98.8 F (37.1 C) (Oral)  Resp 16  SpO2 100%  LMP 05/18/2013 Physical Exam  Nursing note and vitals reviewed. Constitutional: She is oriented to person, place, and time. She appears well-developed and well-nourished.  Abdominal: Soft. Bowel sounds are normal.  Pruritis ani dermatitis.  Nontender, no drainage from i+d site, no hemmorhoids  Neurological: She is alert and oriented to person, place, and time.  Skin: Skin is warm and dry.    ED Course  Procedures (including critical care time) Labs Review Labs Reviewed - No data to display Imaging Review No results found.  MDM      Linna Hoff, MD 06/10/13 1949

## 2013-06-10 NOTE — ED Notes (Signed)
Pt is here for a f/u on a perianal abscess... Seen here on 9/11 Did not finish antibiotics  Sxs include: itchiness and burnging... Unable to wipe due to itchiness Denies: fevers, drainage Alert w/no signs of acute distress.

## 2013-06-14 ENCOUNTER — Ambulatory Visit (INDEPENDENT_AMBULATORY_CARE_PROVIDER_SITE_OTHER): Payer: BC Managed Care – PPO | Admitting: Family Medicine

## 2013-06-14 ENCOUNTER — Encounter: Payer: Self-pay | Admitting: Family Medicine

## 2013-06-14 VITALS — BP 114/80 | HR 111 | Temp 99.1°F | Wt 240.0 lb

## 2013-06-14 DIAGNOSIS — K611 Rectal abscess: Secondary | ICD-10-CM | POA: Insufficient documentation

## 2013-06-14 DIAGNOSIS — K612 Anorectal abscess: Secondary | ICD-10-CM

## 2013-06-14 MED ORDER — DOXYCYCLINE HYCLATE 100 MG PO TABS: 100.0000 mg | ORAL_TABLET | Freq: Two times a day (BID) | ORAL | Status: DC

## 2013-06-14 MED ORDER — CEFTRIAXONE SODIUM 1 G IJ SOLR
2.0000 g | Freq: Once | INTRAMUSCULAR | Status: AC
  Administered 2013-06-14: 2 g via INTRAMUSCULAR

## 2013-06-14 NOTE — Patient Instructions (Signed)
Abscess An abscess is an infected area that contains a collection of pus and debris.It can occur in almost any part of the body. An abscess is also known as a furuncle or boil. CAUSES  An abscess occurs when tissue gets infected. This can occur from blockage of oil or sweat glands, infection of hair follicles, or a minor injury to the skin. As the body tries to fight the infection, pus collects in the area and creates pressure under the skin. This pressure causes pain. People with weakened immune systems have difficulty fighting infections and get certain abscesses more often.  SYMPTOMS Usually an abscess develops on the skin and becomes a painful mass that is red, warm, and tender. If the abscess forms under the skin, you may feel a moveable soft area under the skin. Some abscesses break open (rupture) on their own, but most will continue to get worse without care. The infection can spread deeper into the body and eventually into the bloodstream, causing you to feel ill.  DIAGNOSIS  Your caregiver will take your medical history and perform a physical exam. A sample of fluid may also be taken from the abscess to determine what is causing your infection. TREATMENT  Your caregiver may prescribe antibiotic medicines to fight the infection. However, taking antibiotics alone usually does not cure an abscess. Your caregiver may need to make a small cut (incision) in the abscess to drain the pus. In some cases, gauze is packed into the abscess to reduce pain and to continue draining the area. HOME CARE INSTRUCTIONS   Only take over-the-counter or prescription medicines for pain, discomfort, or fever as directed by your caregiver.  If you were prescribed antibiotics, take them as directed. Finish them even if you start to feel better.  If gauze is used, follow your caregiver's directions for changing the gauze.  To avoid spreading the infection:  Keep your draining abscess covered with a  bandage.  Wash your hands well.  Do not share personal care items, towels, or whirlpools with others.  Avoid skin contact with others.  Keep your skin and clothes clean around the abscess.  Keep all follow-up appointments as directed by your caregiver. SEEK MEDICAL CARE IF:   You have increased pain, swelling, redness, fluid drainage, or bleeding.  You have muscle aches, chills, or a general ill feeling.  You have a fever. MAKE SURE YOU:   Understand these instructions.  Will watch your condition.  Will get help right away if you are not doing well or get worse. Document Released: 06/12/2005 Document Revised: 03/03/2012 Document Reviewed: 11/15/2011 [employer] Patient Information 2014 [employer].  

## 2013-06-14 NOTE — Progress Notes (Signed)
  Subjective:    Patient ID: Stacy Gentry, female    DOB: 1976-03-04, 37 y.o.   MRN: [id number]  HPI Pt here c/o abscess close to rectum.  She went to urgent care and it was drained.  She finished abx.  Packing came out on its own.  Now it has filled again and is painful. No other complaints.  No fever.   Review of Systems As above    Objective:   Physical Exam  BP 114/80  Pulse 111  Temp(Src) 99.1 F (37.3 C) (Oral)  Wt 240 lb (108.863 kg)  BMI 41.18 kg/m2  SpO2 95%  LMP 05/18/2013 General appearance: alert, cooperative, appears stated age and no distress Rectum--- + abscess L side,  Tender to touch and pain extends to labia       Assessment & Plan:

## 2013-06-14 NOTE — Assessment & Plan Note (Signed)
Rocephin IM Doxycycline  Warm compresses--- soaks Surgery referral Pt to go to ER if pain increases

## 2013-06-15 ENCOUNTER — Encounter (INDEPENDENT_AMBULATORY_CARE_PROVIDER_SITE_OTHER): Payer: Self-pay | Admitting: Surgery

## 2013-06-15 ENCOUNTER — Ambulatory Visit (INDEPENDENT_AMBULATORY_CARE_PROVIDER_SITE_OTHER): Payer: BC Managed Care – PPO | Admitting: Surgery

## 2013-06-15 ENCOUNTER — Encounter (INDEPENDENT_AMBULATORY_CARE_PROVIDER_SITE_OTHER): Payer: Self-pay

## 2013-06-15 ENCOUNTER — Other Ambulatory Visit (INDEPENDENT_AMBULATORY_CARE_PROVIDER_SITE_OTHER): Payer: Self-pay | Admitting: Surgery

## 2013-06-15 VITALS — BP 126/80 | HR 72 | Temp 97.7°F | Resp 14 | Ht 64.0 in | Wt 237.6 lb

## 2013-06-15 DIAGNOSIS — K6289 Other specified diseases of anus and rectum: Secondary | ICD-10-CM

## 2013-06-15 DIAGNOSIS — R300 Dysuria: Secondary | ICD-10-CM

## 2013-06-15 MED ORDER — FLUCONAZOLE 100 MG PO TABS: 100.0000 mg | ORAL_TABLET | Freq: Every day | ORAL | Status: AC

## 2013-06-15 NOTE — Patient Instructions (Signed)
Will check urine for infection.  No abscess on todays exam.  Finish antibiotics and stop applying anusol for now. Sitz baths.

## 2013-06-15 NOTE — Progress Notes (Signed)
Subjective:     Patient ID: Stacy Gentry, female   DOB: June 24, 1976, 37 y.o.   MRN: [id number]  HPI  Patient presents at the request of Dr Laury Axon for perianal abscess. She had a perianal abscess drained 20 days ago in urgent care. This is located left lateral position according to the notes are reviewed. This is back to resolve. She's been having some clear drainage from the area. She was seen yesterday and evaluated and felt to have recurrence of her abscess by examination. She is a perianal pain left lateral position. She's had no fever or chills. She is a clear drainage from the incision site since surgery. She complains of dysuria now. She also has burning around the anal canal. No constipation. Review of Systems  Gastrointestinal: Negative.   Genitourinary: Positive for dysuria.  Musculoskeletal: Negative.   Hematological: Negative.   Psychiatric/Behavioral: Negative.        Objective:   Physical Exam  Constitutional: She is oriented to person, place, and time. She appears well-developed and well-nourished.  HENT:  Head: Normocephalic and atraumatic.  Eyes: Pupils are equal, round, and reactive to light.  Genitourinary:     Neurological: She is alert and oriented to person, place, and time.  Skin: Skin is warm and dry.  Psychiatric: She has a normal mood and affect. Her behavior is normal. Judgment and thought content normal.       Assessment:     Dysuria  History of perianal or perirectal abscess drained by urgent care with drainage from previous scar site without evidence of recurrent abscess on exam today    Plan:     We'll give her Diflucan 100 mg daily for next 5 days. She does has an irritation possible yeast involving the anal canal. I've asked her to stop taking the Anusol. I cannot palpate an obvious abscess today but she certainly could have a fistula in ano. No signs of. The or feculent drainage from this area and no mass on digital rectal examination I  could feel were significant discomfort with digital exam. Return to clinic in 2-3 weeks for recheck to evaluate for fistula in ano. Urinalysis sent for dysuria. I told her to go ahead and finish her antibiotics. Return to clinic if symptoms worsen.

## 2013-06-16 LAB — URINALYSIS, MICROSCOPIC ONLY
Crystals: NONE SEEN
Squamous Epithelial / LPF: NONE SEEN

## 2013-06-16 LAB — URINALYSIS, ROUTINE W REFLEX MICROSCOPIC
Bilirubin Urine: NEGATIVE
Leukocytes, UA: NEGATIVE
Nitrite: NEGATIVE
Specific Gravity, Urine: 1.011 (ref 1.005–1.030)
Urobilinogen, UA: 0.2 mg/dL (ref 0.0–1.0)

## 2013-07-02 ENCOUNTER — Encounter (INDEPENDENT_AMBULATORY_CARE_PROVIDER_SITE_OTHER): Payer: Self-pay | Admitting: Surgery

## 2013-07-02 ENCOUNTER — Ambulatory Visit (INDEPENDENT_AMBULATORY_CARE_PROVIDER_SITE_OTHER): Payer: BC Managed Care – PPO | Admitting: Surgery

## 2013-07-02 VITALS — BP 110/76 | HR 84 | Temp 98.0°F | Resp 15 | Ht 64.0 in | Wt 238.2 lb

## 2013-07-02 DIAGNOSIS — K612 Anorectal abscess: Secondary | ICD-10-CM

## 2013-07-02 DIAGNOSIS — K610 Anal abscess: Secondary | ICD-10-CM | POA: Insufficient documentation

## 2013-07-02 NOTE — Progress Notes (Signed)
Subjective:     Patient ID: Stacy Gentry, female   DOB: 01/10/1976, 37 y.o.   MRN: [id number]  HPI  Patient presents at the request of Dr Laury Axon for perianal abscess. She had a perianal abscess drained 38 days ago in urgent care. This is located left lateral position according to the notes are reviewed. This is back to resolve. She's been having some clear drainage from the area. She was seen yesterday and evaluated and felt to have recurrence of her abscess by examination. She is a perianal pain left lateral position. She's had no fever or chills. She is a clear drainage from the incision site since surgery. Feels better no pain,  Drainage or dysuria.. She also has burning around the anal canal. No constipation. Review of Systems  Gastrointestinal: Negative.   Genitourinary: Positive for dysuria.  Musculoskeletal: Negative.   Hematological: Negative.   Psychiatric/Behavioral: Negative.        Objective:   Physical Exam  Constitutional: She is oriented to person, place, and time. She appears well-developed and well-nourished.  HENT:  Head: Normocephalic and atraumatic.  Eyes: Pupils are equal, round, and reactive to light.  Genitourinary:  Perianal skin clean no fistula or abscess   Neurological: She is alert and oriented to person, place, and time.  Skin: Skin is warm and dry.  Psychiatric: She has a normal mood and affect. Her behavior is normal. Judgment and thought content normal.       Assessment:      History of perianal or perirectal abscess drained by urgent care with drainage from previous scar site without evidence of recurrent abscess on exam today    Plan:     Return to clinic as needed.

## 2013-07-02 NOTE — Patient Instructions (Signed)
RETURN AS NEEDED 

## 2013-07-22 ENCOUNTER — Other Ambulatory Visit: Payer: Self-pay

## 2013-09-15 ENCOUNTER — Encounter: Payer: Self-pay | Admitting: Family Medicine

## 2013-09-15 ENCOUNTER — Ambulatory Visit (INDEPENDENT_AMBULATORY_CARE_PROVIDER_SITE_OTHER): Payer: BC Managed Care – PPO | Admitting: Family Medicine

## 2013-09-15 VITALS — BP 110/70 | HR 105 | Temp 98.8°F | Wt 238.4 lb

## 2013-09-15 DIAGNOSIS — J329 Chronic sinusitis, unspecified: Secondary | ICD-10-CM

## 2013-09-15 DIAGNOSIS — J40 Bronchitis, not specified as acute or chronic: Secondary | ICD-10-CM

## 2013-09-15 DIAGNOSIS — J45901 Unspecified asthma with (acute) exacerbation: Secondary | ICD-10-CM

## 2013-09-15 DIAGNOSIS — R0789 Other chest pain: Secondary | ICD-10-CM

## 2013-09-15 MED ORDER — ALBUTEROL SULFATE (2.5 MG/3ML) 0.083% IN NEBU
2.5000 mg | INHALATION_SOLUTION | Freq: Once | RESPIRATORY_TRACT | Status: AC
  Administered 2013-09-15: 2.5 mg via RESPIRATORY_TRACT

## 2013-09-15 MED ORDER — ALBUTEROL SULFATE HFA 108 (90 BASE) MCG/ACT IN AERS: 2.0000 | INHALATION_SPRAY | Freq: Four times a day (QID) | RESPIRATORY_TRACT | Status: DC | PRN

## 2013-09-15 MED ORDER — AMOXICILLIN-POT CLAVULANATE 875-125 MG PO TABS: 1.0000 | ORAL_TABLET | Freq: Two times a day (BID) | ORAL | Status: DC

## 2013-09-15 MED ORDER — PREDNISONE 10 MG PO TABS: ORAL_TABLET | ORAL | Status: DC

## 2013-09-15 NOTE — Progress Notes (Signed)
  Subjective:     Stacy Gentry is a 37 y.o. female here for evaluation of a cough. Onset of symptoms was 1 week ago. Symptoms have been gradually worsening since that time. The cough is barky and dry and is aggravated by infection and reclining position. Associated symptoms include: shortness of breath, sputum production and wheezing. Patient does have a history of asthma. Patient does have a history of environmental allergens. Patient has not traveled recently. Patient does not have a history of smoking. Patient has not had a previous chest x-ray. Patient has not had a PPD done.  The following portions of the patient's history were reviewed and updated as appropriate: allergies, current medications, past family history, past medical history, past social history, past surgical history and problem list.  Review of Systems Pertinent items are noted in HPI.    Objective:    Oxygen saturation 98% on room air BP 110/70  Pulse 105  Temp(Src) 98.8 F (37.1 C) (Oral)  Wt 238 lb 6.4 oz (108.138 kg)  SpO2 98% General appearance: alert, cooperative, appears stated age and no distress Ears: + fluid Nose: green and yellow discharge, moderate congestion, turbinates red, swollen, sinus tenderness bilateral Throat: lips, mucosa, and tongue normal; teeth and gums normal Neck: moderate anterior cervical adenopathy, supple, symmetrical, trachea midline and thyroid not enlarged, symmetric, no tenderness/mass/nodules Lungs: wheezes bilaterally Heart: S1, S2 normal Extremities: extremities normal, atraumatic, no cyanosis or edema Lymph nodes: + adenopathy    Assessment:    Acute Bronchitis and Sinusitis    Plan:    Antibiotics per medication orders. Antitussives per medication orders. Avoid exposure to tobacco smoke and fumes. B-agonist inhaler. Call if shortness of breath worsens, blood in sputum, change in character of cough, development of fever or chills, inability to maintain nutrition and  hydration. Avoid exposure to tobacco smoke and fumes. Trial of decongestant. pred taper

## 2013-09-15 NOTE — Progress Notes (Signed)
Pre visit review using our clinic review tool, if applicable. No additional management support is needed unless otherwise documented below in the visit note. 

## 2013-09-15 NOTE — Patient Instructions (Signed)

## 2013-10-01 ENCOUNTER — Encounter: Payer: Self-pay | Admitting: Family Medicine

## 2013-10-01 ENCOUNTER — Ambulatory Visit (INDEPENDENT_AMBULATORY_CARE_PROVIDER_SITE_OTHER): Payer: BC Managed Care – PPO | Admitting: Family Medicine

## 2013-10-01 ENCOUNTER — Ambulatory Visit: Payer: BC Managed Care – PPO | Admitting: Family Medicine

## 2013-10-01 ENCOUNTER — Telehealth: Payer: Self-pay

## 2013-10-01 VITALS — BP 132/82 | HR 120 | Temp 99.0°F | Resp 20 | Wt 240.1 lb

## 2013-10-01 DIAGNOSIS — J329 Chronic sinusitis, unspecified: Secondary | ICD-10-CM

## 2013-10-01 DIAGNOSIS — J45901 Unspecified asthma with (acute) exacerbation: Secondary | ICD-10-CM | POA: Insufficient documentation

## 2013-10-01 DIAGNOSIS — J4521 Mild intermittent asthma with (acute) exacerbation: Secondary | ICD-10-CM

## 2013-10-01 MED ORDER — IPRATROPIUM-ALBUTEROL 0.5-2.5 (3) MG/3ML IN SOLN: 3.0000 mL | RESPIRATORY_TRACT | Status: DC

## 2013-10-01 MED ORDER — PREDNISONE 10 MG PO TABS: ORAL_TABLET | ORAL | Status: DC

## 2013-10-01 MED ORDER — BECLOMETHASONE DIPROPIONATE 40 MCG/ACT IN AERS: 2.0000 | INHALATION_SPRAY | Freq: Two times a day (BID) | RESPIRATORY_TRACT | Status: DC

## 2013-10-01 MED ORDER — IPRATROPIUM-ALBUTEROL 0.5-2.5 (3) MG/3ML IN SOLN
3.0000 mL | Freq: Once | RESPIRATORY_TRACT | Status: AC
  Administered 2013-10-01: 3 mL via RESPIRATORY_TRACT

## 2013-10-01 MED ORDER — AZITHROMYCIN 250 MG PO TABS: ORAL_TABLET | ORAL | Status: DC

## 2013-10-01 MED ORDER — PROMETHAZINE-DM 6.25-15 MG/5ML PO SYRP: 5.0000 mL | ORAL_SOLUTION | Freq: Four times a day (QID) | ORAL | Status: DC | PRN

## 2013-10-01 MED ORDER — METHYLPREDNISOLONE ACETATE 80 MG/ML IJ SUSP
80.0000 mg | Freq: Once | INTRAMUSCULAR | Status: AC
  Administered 2013-10-01: 80 mg via INTRAMUSCULAR

## 2013-10-01 NOTE — Progress Notes (Signed)
Pre visit review using our clinic review tool, if applicable. No additional management support is needed unless otherwise documented below in the visit note. 

## 2013-10-01 NOTE — Patient Instructions (Signed)
Follow up as needed Start the Zpack as directed Restart the Qvar Use the Proair as needed Use the cough syrup as needed-will cause drowsiness Drink plenty of fluids REST! Hang in there!!!

## 2013-10-01 NOTE — Assessment & Plan Note (Signed)
New.  Refill on Qvar.  Start abx.  Cough meds prn.  pred taper.  Reviewed supportive care and red flags that should prompt return.  Pt expressed understanding and is in agreement w/ plan.

## 2013-10-01 NOTE — Telephone Encounter (Signed)
Rx sent but c/o tightness. She is going to see Dr.Tabori at 11:30.      KP

## 2013-10-01 NOTE — Telephone Encounter (Signed)
z- pak #1  As directed  Will need ov for any more

## 2013-10-01 NOTE — Telephone Encounter (Signed)
Spoke with patient and she stated she is still having the Bronchitis, she was seen 2 weeks ago and the symptoms seemed to be resolving but came right back, She wanted to know if we could refill the Prednisone and Abx. Patient is having the cough and some wheezing. She said the cough is what is really bothering her. Please advise    KP

## 2013-10-01 NOTE — Progress Notes (Signed)
   Subjective:    Patient ID: Stacy Gentry, female    DOB: 1976/04/22, 38 y.o.   MRN: [id number]  HPI Asthmatic bronchitis- sxs started yesterday.  Cough is dry but almost continuous.  + chest tightness, wheezing, SOB.  Used albuterol 6-7 times yesterday.  Ran out of Qvar 2 weeks ago.  Low grade temp today.  + sick contacts.  No sinus pain/pressure.   Review of Systems For ROS see HPI     Objective:   Physical Exam  Vitals reviewed. Constitutional: She appears well-developed and well-nourished. No distress.  HENT:  Head: Normocephalic and atraumatic.  TMs normal bilaterally Mild nasal congestion Throat w/out erythema, edema, or exudate  Eyes: Conjunctivae and EOM are normal. Pupils are equal, round, and reactive to light.  Neck: Normal range of motion. Neck supple.  Cardiovascular: Normal rate, regular rhythm, normal heart sounds and intact distal pulses.   No murmur heard. Pulmonary/Chest: Effort normal. No respiratory distress. She has wheezes (diffuse wheezes, improved s/p neb tx).  + dry, hacking cough  Lymphadenopathy:    She has no cervical adenopathy.          Assessment & Plan:

## 2013-10-01 NOTE — Telephone Encounter (Signed)
Tried calling the patient but her mailbox is full.     KP

## 2013-10-05 ENCOUNTER — Telehealth: Payer: Self-pay

## 2013-10-05 ENCOUNTER — Ambulatory Visit (HOSPITAL_BASED_OUTPATIENT_CLINIC_OR_DEPARTMENT_OTHER)
Admission: RE | Admit: 2013-10-05 | Discharge: 2013-10-05 | Disposition: A | Discharge status: Home / Self Care | Payer: BC Managed Care – PPO | Source: Ambulatory Visit | Attending: Family Medicine | Admitting: Family Medicine

## 2013-10-05 DIAGNOSIS — J45901 Unspecified asthma with (acute) exacerbation: Secondary | ICD-10-CM

## 2013-10-05 NOTE — Telephone Encounter (Signed)
[facility] for 2 view CXR- asthma exacerbation.  Please make sure she is taking prednisone and using inhalers as directed

## 2013-10-05 NOTE — Telephone Encounter (Signed)
Pt called stating that she would like to know if any results were in regarding her CXR?

## 2013-10-05 NOTE — Telephone Encounter (Signed)
Call from patient who was seen 10/01/13, she stated she is still coughing, with SOB and wheeze. Hx of Asthma and would like a Cxr. Please advise      KP

## 2013-10-05 NOTE — Telephone Encounter (Signed)
Spoke with pt she advised that she still has 3 days of the prednisone left, states she will go to [facility] to get Cxr completed.

## 2013-10-06 MED ORDER — DOXYCYCLINE HYCLATE 100 MG PO TABS: 100.0000 mg | ORAL_TABLET | Freq: Two times a day (BID) | ORAL | Status: DC

## 2013-10-06 MED ORDER — PREDNISONE 10 MG PO TABS: ORAL_TABLET | ORAL | Status: DC

## 2013-10-06 NOTE — Addendum Note (Signed)
Addended by: Kris Hartmann on: 10/06/2013 08:32 AM   Modules accepted: Orders

## 2013-10-06 NOTE — Telephone Encounter (Signed)
Pt notified, and meds filled.

## 2013-10-06 NOTE — Telephone Encounter (Signed)
Spoke with pt and notified. Pt still sounds very congested and hard to speak. Please advise.

## 2013-10-06 NOTE — Telephone Encounter (Signed)
Please refer to pulm for asthma exacerbation STOP the Zpack Start Doxy 100mg  BID (take w/ food) Restart Prednisone 20mg - 2 tabs daily at same time x10 days

## 2013-10-06 NOTE — Telephone Encounter (Signed)
Normal CXR

## 2013-10-09 ENCOUNTER — Emergency Department (HOSPITAL_COMMUNITY)
Admission: EM | Admit: 2013-10-09 | Discharge: 2013-10-09 | Disposition: A | Discharge status: Home / Self Care | Payer: BC Managed Care – PPO | Source: Home / Self Care | Attending: Family Medicine | Admitting: Family Medicine

## 2013-10-09 ENCOUNTER — Encounter (HOSPITAL_COMMUNITY): Payer: Self-pay | Admitting: Emergency Medicine

## 2013-10-09 DIAGNOSIS — K611 Rectal abscess: Secondary | ICD-10-CM

## 2013-10-09 DIAGNOSIS — K612 Anorectal abscess: Secondary | ICD-10-CM

## 2013-10-09 MED ORDER — SULFAMETHOXAZOLE-TRIMETHOPRIM 800-160 MG PO TABS: 2.0000 | ORAL_TABLET | Freq: Two times a day (BID) | ORAL | Status: DC

## 2013-10-09 NOTE — ED Provider Notes (Signed)
Stacy Gentry is a 38 y.o. female who presents to Urgent Care today for left perirectal abscess present for one day. Patient notes moderate pain. She has not tried any medications yet. She has a history of multiple perirectal assess this. This is consistent with early abscess for her. Additionally patient notes that she has an elevated heart rate. She says this is normal for her and has had multiple workups for this. She denies any chest pains palpitations or shortness of breath. She uses albuterol intermittently for asthma.   Past Medical History  Diagnosis Date  . Asthma   . Depression   . Syncope   . Migraines     Dr.Paul Hassell Done in [place]  . PCOS (polycystic ovarian syndrome)     [facility]   History  Substance Use Topics  . Smoking status: Never Smoker   . Smokeless tobacco: Never Used  . Alcohol Use: No   ROS as above Medications: No current facility-administered medications for this encounter.   Current Outpatient Prescriptions  Medication Sig Dispense Refill  . albuterol (VENTOLIN HFA) 108 (90 BASE) MCG/ACT inhaler Inhale 2 puffs into the lungs every 6 (six) hours as needed for wheezing.  1 Inhaler  1  . azithromycin (ZITHROMAX) 250 MG tablet 2 tabs on day 1, 1 tab on day 2-5  6 tablet  0  . beclomethasone (QVAR) 40 MCG/ACT inhaler Inhale 2 puffs into the lungs 2 (two) times daily.  1 Inhaler  6  . cyclobenzaprine (FLEXERIL) 5 MG tablet Take 5 mg by mouth 3 (three) times daily as needed. For muscle spasms.      . diclofenac (VOLTAREN) 50 MG EC tablet Take 50 mg by mouth at bedtime.      Marland Kitchen doxycycline (VIBRA-TABS) 100 MG tablet Take 1 tablet (100 mg total) by mouth 2 (two) times daily. Take with food.  20 tablet  0  . hydrocortisone (ANUSOL-HC) 2.5 % rectal cream Place rectally 2 (two) times daily.  30 g  1  . predniSONE (DELTASONE) 10 MG tablet Take two tablets daily at the same time, for 10 days.  Take w/ food.  20 tablet  0  . promethazine-dextromethorphan  (PROMETHAZINE-DM) 6.25-15 MG/5ML syrup Take 5 mLs by mouth 4 (four) times daily as needed.  240 mL  0  . sulfamethoxazole-trimethoprim (SEPTRA DS) 800-160 MG per tablet Take 2 tablets by mouth 2 (two) times daily.  28 tablet  0  . SUMAtriptan Succinate (IMITREX IJ) Inject 6 mg as directed as needed. For migraines.      . topiramate (TOPAMAX) 25 MG tablet Take 50 mg by mouth daily.         Exam:  BP 134/85  Pulse 120  Temp(Src) 98.9 F (37.2 C) (Oral)  Resp 18  SpO2 100% Gen: Well NAD Skin : Small fluctuant tender perirectal abscess left-side of the anus.   INCISION AND DRAINAGE Performed by: Lynne Leader, S Consent: Verbal consent obtained. Risks and benefits: risks, benefits and alternatives were discussed Type: abscess  Body area: Left perirectal  Anesthesia: local infiltration  Incision was made with a scalpel.  Local anesthetic: lidocaine 2% w/ epinephrine  Anesthetic total: 2 ml  Complexity: Simple Blunt dissection to break up loculations  Drainage: purulent  Drainage amount: Small   Packing material: None   Patient tolerance: Patient tolerated the procedure well with no immediate complications.   Culture pending.   Assessment and Plan: 38 y.o. female with small left-sided perirectal abscess. Treated with incision  and drainage. Culture pending. Placed patient on empiric Bactrim antibiotics. Followup with primary care provider.  Discussed warning signs or symptoms. Please see discharge instructions. Patient expresses understanding.    Gregor Hams, MD 10/09/13 1001

## 2013-10-09 NOTE — ED Notes (Signed)
I&D perirectal abscess per Dr. Georgina Snell with RN assistance.

## 2013-10-09 NOTE — ED Notes (Signed)
Pt  Has   An  abcess   To  l  Lower  Buttock        Since  yest            Pt  Reports      She  Also reports       She  Is  Being treated  For  Bronchitis     Taking prednisone and  Albuterol

## 2013-10-09 NOTE — Discharge Instructions (Signed)
Thank you for coming in today. Take the Bactrim twice daily.  Use sitz bath daily or twice daily.  Follow up with your primary care provider.   Abscess An abscess (boil or furuncle) is an infected area on or under the skin. This area is filled with yellowish-white fluid (pus) and other material (debris). HOME CARE   Only take medicines as told by your doctor.  If you were given antibiotic medicine, take it as directed. Finish the medicine even if you start to feel better.  If gauze is used, follow your doctor's directions for changing the gauze.  To avoid spreading the infection:  Keep your abscess covered with a bandage.  Wash your hands well.  Do not share personal care items, towels, or whirlpools with others.  Avoid skin contact with others.  Keep your skin and clothes clean around the abscess.  Keep all doctor visits as told. GET HELP RIGHT AWAY IF:   You have more pain, puffiness (swelling), or redness in the wound site.  You have more fluid or blood coming from the wound site.  You have muscle aches, chills, or you feel sick.  You have a fever. MAKE SURE YOU:   Understand these instructions.  Will watch your condition.  Will get help right away if you are not doing well or get worse. Document Released: 02/19/2008 Document Revised: 03/03/2012 Document Reviewed: 11/15/2011 [facility] Patient Information 2014 [place].

## 2013-10-13 LAB — CULTURE, ROUTINE-ABSCESS: Special Requests: NORMAL

## 2013-10-13 NOTE — ED Notes (Addendum)
Abscess culture: Rare Enterococcus species.  Treated with Doxycycline on 1/21 and Septra DS on 1/24 and had I and D.  Message to Dr. Georgina Snell.  He reviewed it today. Stacy Gentry 10/13/2013 No further action per Dr. Georgina Snell. 10/16/2013

## 2013-10-20 ENCOUNTER — Institutional Professional Consult (permissible substitution): Payer: BC Managed Care – PPO | Admitting: Emergency Medicine

## 2013-11-01 ENCOUNTER — Institutional Professional Consult (permissible substitution): Payer: BC Managed Care – PPO | Admitting: Emergency Medicine

## 2013-11-09 ENCOUNTER — Institutional Professional Consult (permissible substitution): Payer: BC Managed Care – PPO | Admitting: Emergency Medicine

## 2013-12-08 ENCOUNTER — Telehealth: Payer: Self-pay

## 2013-12-08 DIAGNOSIS — R21 Rash and other nonspecific skin eruption: Secondary | ICD-10-CM

## 2013-12-08 DIAGNOSIS — N644 Mastodynia: Secondary | ICD-10-CM

## 2013-12-08 NOTE — Telephone Encounter (Signed)
Patient said she was seen for a rash and bumps on her breast, she stated the rash goes away and comes back and would like a Mammogram, she stated her mother presented with the same symptoms when she was diagnosed with breat cancer. The patient is concerned because she is having pain in the left breast.  The topical medication that was prescribed is also not helping her. Please advise      KP

## 2013-12-09 NOTE — Telephone Encounter (Signed)
Ok to order diagnostic mammogram for rash and breast pain

## 2013-12-16 ENCOUNTER — Other Ambulatory Visit: Payer: Self-pay | Admitting: Family Medicine

## 2013-12-16 DIAGNOSIS — R21 Rash and other nonspecific skin eruption: Secondary | ICD-10-CM

## 2013-12-16 DIAGNOSIS — N644 Mastodynia: Secondary | ICD-10-CM

## 2013-12-23 ENCOUNTER — Other Ambulatory Visit: Payer: Self-pay

## 2013-12-23 ENCOUNTER — Other Ambulatory Visit: Payer: Self-pay | Admitting: Family Medicine

## 2013-12-23 DIAGNOSIS — N644 Mastodynia: Secondary | ICD-10-CM

## 2013-12-23 DIAGNOSIS — R21 Rash and other nonspecific skin eruption: Secondary | ICD-10-CM

## 2013-12-29 ENCOUNTER — Ambulatory Visit
Admission: RE | Admit: 2013-12-29 | Discharge: 2013-12-29 | Disposition: A | Discharge status: Home / Self Care | Payer: BC Managed Care – PPO | Source: Ambulatory Visit | Attending: Family Medicine | Admitting: Family Medicine

## 2013-12-29 DIAGNOSIS — R21 Rash and other nonspecific skin eruption: Secondary | ICD-10-CM

## 2013-12-29 DIAGNOSIS — N644 Mastodynia: Secondary | ICD-10-CM

## 2014-01-19 ENCOUNTER — Other Ambulatory Visit: Payer: Self-pay | Admitting: Physician Assistant

## 2014-01-19 ENCOUNTER — Ambulatory Visit (INDEPENDENT_AMBULATORY_CARE_PROVIDER_SITE_OTHER): Payer: BC Managed Care – PPO | Admitting: Physician Assistant

## 2014-01-19 ENCOUNTER — Emergency Department (HOSPITAL_BASED_OUTPATIENT_CLINIC_OR_DEPARTMENT_OTHER): Payer: BC Managed Care – PPO

## 2014-01-19 ENCOUNTER — Emergency Department (HOSPITAL_BASED_OUTPATIENT_CLINIC_OR_DEPARTMENT_OTHER)
Admission: EM | Admit: 2014-01-19 | Discharge: 2014-01-19 | Disposition: A | Discharge status: Home / Self Care | Payer: BC Managed Care – PPO | Attending: Emergency Medicine | Admitting: Emergency Medicine

## 2014-01-19 ENCOUNTER — Encounter: Payer: Self-pay | Admitting: Physician Assistant

## 2014-01-19 ENCOUNTER — Encounter (HOSPITAL_BASED_OUTPATIENT_CLINIC_OR_DEPARTMENT_OTHER): Payer: Self-pay | Admitting: Emergency Medicine

## 2014-01-19 VITALS — BP 104/82 | HR 148 | Temp 101.3°F | Resp 20 | Ht 64.0 in | Wt 235.8 lb

## 2014-01-19 DIAGNOSIS — J45909 Unspecified asthma, uncomplicated: Secondary | ICD-10-CM | POA: Insufficient documentation

## 2014-01-19 DIAGNOSIS — Z862 Personal history of diseases of the blood and blood-forming organs and certain disorders involving the immune mechanism: Secondary | ICD-10-CM | POA: Insufficient documentation

## 2014-01-19 DIAGNOSIS — J159 Unspecified bacterial pneumonia: Secondary | ICD-10-CM | POA: Insufficient documentation

## 2014-01-19 DIAGNOSIS — J189 Pneumonia, unspecified organism: Secondary | ICD-10-CM

## 2014-01-19 DIAGNOSIS — R000 Tachycardia, unspecified: Secondary | ICD-10-CM | POA: Insufficient documentation

## 2014-01-19 DIAGNOSIS — G43909 Migraine, unspecified, not intractable, without status migrainosus: Secondary | ICD-10-CM | POA: Insufficient documentation

## 2014-01-19 DIAGNOSIS — Z3202 Encounter for pregnancy test, result negative: Secondary | ICD-10-CM | POA: Insufficient documentation

## 2014-01-19 DIAGNOSIS — IMO0002 Reserved for concepts with insufficient information to code with codable children: Secondary | ICD-10-CM | POA: Insufficient documentation

## 2014-01-19 DIAGNOSIS — B9789 Other viral agents as the cause of diseases classified elsewhere: Secondary | ICD-10-CM

## 2014-01-19 DIAGNOSIS — R110 Nausea: Secondary | ICD-10-CM | POA: Insufficient documentation

## 2014-01-19 DIAGNOSIS — Z8659 Personal history of other mental and behavioral disorders: Secondary | ICD-10-CM | POA: Insufficient documentation

## 2014-01-19 DIAGNOSIS — Z8639 Personal history of other endocrine, nutritional and metabolic disease: Secondary | ICD-10-CM | POA: Insufficient documentation

## 2014-01-19 DIAGNOSIS — B349 Viral infection, unspecified: Secondary | ICD-10-CM

## 2014-01-19 DIAGNOSIS — Z79899 Other long term (current) drug therapy: Secondary | ICD-10-CM | POA: Insufficient documentation

## 2014-01-19 DIAGNOSIS — R05 Cough: Secondary | ICD-10-CM

## 2014-01-19 DIAGNOSIS — R059 Cough, unspecified: Secondary | ICD-10-CM

## 2014-01-19 LAB — URINALYSIS, ROUTINE W REFLEX MICROSCOPIC
Bilirubin Urine: NEGATIVE
Glucose, UA: NEGATIVE mg/dL
HGB URINE DIPSTICK: NEGATIVE
Ketones, ur: NEGATIVE mg/dL
Leukocytes, UA: NEGATIVE
Nitrite: NEGATIVE
PROTEIN: NEGATIVE mg/dL
SPECIFIC GRAVITY, URINE: 1.007 (ref 1.005–1.030)
Urobilinogen, UA: 0.2 mg/dL (ref 0.0–1.0)
pH: 7.5 (ref 5.0–8.0)

## 2014-01-19 LAB — CBC WITH DIFFERENTIAL/PLATELET
BASOS ABS: 0 10*3/uL (ref 0.0–0.1)
BASOS PCT: 0 % (ref 0–1)
EOS PCT: 1 % (ref 0–5)
Eosinophils Absolute: 0.1 10*3/uL (ref 0.0–0.7)
HEMATOCRIT: 35.2 % — AB (ref 36.0–46.0)
HEMOGLOBIN: 12 g/dL (ref 12.0–15.0)
LYMPHS PCT: 11 % — AB (ref 12–46)
Lymphs Abs: 1.2 10*3/uL (ref 0.7–4.0)
MCH: 28.6 pg (ref 26.0–34.0)
MCHC: 34.1 g/dL (ref 30.0–36.0)
MCV: 84 fL (ref 78.0–100.0)
Monocytes Absolute: 1 10*3/uL (ref 0.1–1.0)
Monocytes Relative: 9 % (ref 3–12)
NEUTROS ABS: 8.9 10*3/uL — AB (ref 1.7–7.7)
Neutrophils Relative %: 79 % — ABNORMAL HIGH (ref 43–77)
PLATELETS: 358 10*3/uL (ref 150–400)
RBC: 4.19 MIL/uL (ref 3.87–5.11)
RDW: 12.6 % (ref 11.5–15.5)
WBC: 11.3 10*3/uL — ABNORMAL HIGH (ref 4.0–10.5)

## 2014-01-19 LAB — COMPREHENSIVE METABOLIC PANEL
ALT: 15 U/L (ref 0–35)
AST: 16 U/L (ref 0–37)
Albumin: 4.1 g/dL (ref 3.5–5.2)
Alkaline Phosphatase: 74 U/L (ref 39–117)
BUN: 7 mg/dL (ref 6–23)
CALCIUM: 9.2 mg/dL (ref 8.4–10.5)
CO2: 20 meq/L (ref 19–32)
Chloride: 103 mEq/L (ref 96–112)
Creatinine, Ser: 1 mg/dL (ref 0.50–1.10)
GFR calc Af Amer: 82 mL/min — ABNORMAL LOW (ref >90)
GFR, EST NON AFRICAN AMERICAN: 71 mL/min — AB (ref >90)
Glucose, Bld: 111 mg/dL — ABNORMAL HIGH (ref 70–99)
Potassium: 3.9 mEq/L (ref 3.7–5.3)
SODIUM: 138 meq/L (ref 137–147)
Total Bilirubin: 0.3 mg/dL (ref 0.3–1.2)
Total Protein: 8 g/dL (ref 6.0–8.3)

## 2014-01-19 LAB — PREGNANCY, URINE: PREG TEST UR: NEGATIVE

## 2014-01-19 LAB — LIPASE, BLOOD: Lipase: 15 U/L (ref 11–59)

## 2014-01-19 MED ORDER — BENZONATATE 100 MG PO CAPS: 100.0000 mg | ORAL_CAPSULE | Freq: Three times a day (TID) | ORAL | Status: DC

## 2014-01-19 MED ORDER — ONDANSETRON HCL 4 MG/2ML IJ SOLN
4.0000 mg | Freq: Once | INTRAMUSCULAR | Status: AC
  Administered 2014-01-19: 4 mg via INTRAVENOUS
  Filled 2014-01-19: qty 2

## 2014-01-19 MED ORDER — AZITHROMYCIN 250 MG PO TABS: 250.0000 mg | ORAL_TABLET | Freq: Every day | ORAL | Status: DC

## 2014-01-19 MED ORDER — ACETAMINOPHEN 325 MG PO TABS
650.0000 mg | ORAL_TABLET | Freq: Once | ORAL | Status: AC
  Administered 2014-01-19: 650 mg via ORAL
  Filled 2014-01-19: qty 2

## 2014-01-19 MED ORDER — BENZONATATE 100 MG PO CAPS: 100.0000 mg | ORAL_CAPSULE | Freq: Two times a day (BID) | ORAL | Status: DC | PRN

## 2014-01-19 MED ORDER — SODIUM CHLORIDE 0.9 % IV SOLN
1000.0000 mL | Freq: Once | INTRAVENOUS | Status: AC
  Administered 2014-01-19: 1000 mL via INTRAVENOUS

## 2014-01-19 MED ORDER — IBUPROFEN 800 MG PO TABS
800.0000 mg | ORAL_TABLET | Freq: Once | ORAL | Status: AC
  Administered 2014-01-19: 800 mg via ORAL
  Filled 2014-01-19: qty 1

## 2014-01-19 MED ORDER — ONDANSETRON 8 MG PO TBDP: 8.0000 mg | ORAL_TABLET | Freq: Three times a day (TID) | ORAL | Status: DC | PRN

## 2014-01-19 MED ORDER — SODIUM CHLORIDE 0.9 % IV SOLN: 1000.0000 mL | INTRAVENOUS | Status: DC

## 2014-01-19 MED ORDER — DEXTROSE 5 % IV SOLN
1.0000 g | INTRAVENOUS | Status: DC
  Administered 2014-01-19: 1 g via INTRAVENOUS

## 2014-01-19 MED ORDER — CEFTRIAXONE SODIUM 1 G IJ SOLR
INTRAMUSCULAR | Status: AC
  Filled 2014-01-19: qty 10

## 2014-01-19 NOTE — ED Provider Notes (Signed)
CSN: [id number]     Arrival date & time 01/19/14  1554 History   First MD Initiated Contact with Patient 01/19/14 1612     Chief Complaint  Patient presents with  . Diarrhea    Patient is a 38 y.o. female presenting with diarrhea. The history is provided by the patient.  Diarrhea Quality:  Watery Severity:  Moderate (three episodes) Duration:  4 days Timing:  Intermittent Associated symptoms: cough, fever and vomiting   Associated symptoms: no abdominal pain   Vomiting:    Quality:  Stomach contents (two episodes)   Duration:  1 day   Timing:  Intermittent Risk factors: no recent antibiotic use     Past Medical History  Diagnosis Date  . Asthma   . Depression   . Syncope   . Migraines     Dr.Paul Hassell Done in [place]  . PCOS (polycystic ovarian syndrome)     Santiago Bumpers   Past Surgical History  Procedure Laterality Date  . None     Family History  Problem Relation Age of Onset  . Breast cancer Mother 75  . Diabetes Mother   . Arthritis Mother   . Breast cancer  60    Maternal Cousin  . Diabetes Maternal Grandmother   . Arthritis Maternal Grandmother   . Diabetes Maternal Aunt   . Hyperlipidemia Paternal Aunt   . Hyperlipidemia Paternal Uncle   . Stroke Paternal Uncle   . Hypertension    . Kidney disease Paternal Uncle   . Depression    . Heart disease Paternal Uncle 36    Sudden death   History  Substance Use Topics  . Smoking status: Never Smoker   . Smokeless tobacco: Never Used  . Alcohol Use: No   OB History   Grav Para Term Preterm Abortions TAB SAB Ect Mult Living   0              Review of Systems  Constitutional: Positive for fever.  Respiratory: Positive for cough.   Gastrointestinal: Positive for vomiting and diarrhea. Negative for abdominal pain and blood in stool.  Genitourinary: Negative for dysuria.      Allergies  Codeine; Flagyl; Hydrocodone; and Oxycodone  Home Medications   Prior to Admission medications   Medication Sig  Start Date End Date Taking? Authorizing Provider  albuterol (VENTOLIN HFA) 108 (90 BASE) MCG/ACT inhaler Inhale 2 puffs into the lungs every 6 (six) hours as needed for wheezing. 09/15/13 09/15/14  Rosalita Chessman, DO  beclomethasone (QVAR) 40 MCG/ACT inhaler Inhale 2 puffs into the lungs 2 (two) times daily. 10/01/13 10/01/14  Midge Minium, MD  benzonatate (TESSALON) 100 MG capsule Take 1 capsule (100 mg total) by mouth 2 (two) times daily as needed for cough. 01/19/14   Leeanne Rio, PA-C  cyclobenzaprine (FLEXERIL) 5 MG tablet Take 5 mg by mouth 3 (three) times daily as needed. For muscle spasms. 07/15/12   Historical Provider, MD  diclofenac (VOLTAREN) 50 MG EC tablet Take 50 mg by mouth at bedtime.    Historical Provider, MD  hydrocortisone (ANUSOL-HC) 2.5 % rectal cream Place rectally 2 (two) times daily. 06/10/13   Billy Fischer, MD  SUMAtriptan Succinate (IMITREX IJ) Inject 6 mg as directed as needed. For migraines.    Historical Provider, MD  topiramate (TOPAMAX) 25 MG tablet Take 50 mg by mouth daily.     Historical Provider, MD   BP 114/76  Pulse 126  Temp(Src) 100.9 F (38.3  C) (Oral)  Resp 28  Ht 5\' 4"  (1.626 m)  Wt 235 lb (106.595 kg)  BMI 40.32 kg/m2  SpO2 99%  LMP 01/06/2014 Physical Exam  Nursing note and vitals reviewed. Constitutional: She appears well-developed and well-nourished. No distress.  HENT:  Head: Normocephalic and atraumatic.  Right Ear: External ear normal.  Left Ear: External ear normal.  Mouth/Throat: No oropharyngeal exudate.  Eyes: Conjunctivae are normal. Right eye exhibits no discharge. Left eye exhibits no discharge. No scleral icterus.  Neck: Neck supple. No tracheal deviation present.  Cardiovascular: Regular rhythm and intact distal pulses.  Tachycardia present.   Pulmonary/Chest: Effort normal. No stridor. No respiratory distress. She has decreased breath sounds in the left lower field. She has no wheezes. She has rales.  Abdominal:  Soft. Bowel sounds are normal. She exhibits no distension. There is no tenderness. There is no rebound and no guarding.  Musculoskeletal: She exhibits no edema and no tenderness.  Neurological: She is alert. She has normal strength. No cranial nerve deficit (no facial droop, extraocular movements intact, no slurred speech) or sensory deficit. She exhibits normal muscle tone. She displays no seizure activity. Coordination normal.  Skin: Skin is warm and dry. No rash noted.  Psychiatric: She has a normal mood and affect.    ED Course  Procedures (including critical care time) Labs Review Labs Reviewed  CBC WITH DIFFERENTIAL - Abnormal; Notable for the following:    WBC 11.3 (*)    HCT 35.2 (*)    Neutrophils Relative % 79 (*)    Neutro Abs 8.9 (*)    Lymphocytes Relative 11 (*)    All other components within normal limits  COMPREHENSIVE METABOLIC PANEL - Abnormal; Notable for the following:    Glucose, Bld 111 (*)    GFR calc non Af Amer 71 (*)    GFR calc Af Amer 82 (*)    All other components within normal limits  LIPASE, BLOOD  URINALYSIS, ROUTINE W REFLEX MICROSCOPIC  PREGNANCY, URINE    Imaging Review Dg Chest 2 View  01/19/2014   CLINICAL DATA:  Diarrhea, fever  EXAM: CHEST  2 VIEW  COMPARISON:  10/05/2013  FINDINGS: Left lower lobe infiltrate, consistent with pneumonia. No effusion. Right lung is clear. Negative for heart failure.  IMPRESSION: Left lower lobe pneumonia.   Electronically Signed   By: Franchot Gallo M.D.   On: 01/19/2014 17:13   Medications  0.9 %  sodium chloride infusion (1,000 mLs Intravenous New Bag/Given 01/19/14 1639)    Followed by  0.9 %  sodium chloride infusion (1,000 mLs Intravenous New Bag/Given 01/19/14 1727)    Followed by  0.9 %  sodium chloride infusion (not administered)  cefTRIAXone (ROCEPHIN) 1 g in dextrose 5 % 50 mL IVPB (1 g Intravenous New Bag/Given 01/19/14 1748)  acetaminophen (TYLENOL) tablet 650 mg (not administered)  ibuprofen  (ADVIL,MOTRIN) tablet 800 mg (800 mg Oral Given 01/19/14 1610)  ondansetron (ZOFRAN) injection 4 mg (4 mg Intravenous Given 01/19/14 1639)  cefTRIAXone (ROCEPHIN) 1 G injection (  Duplicate 05/19/22 5573)     MDM   Final diagnoses:  CAP (community acquired pneumonia)   CXR shows a lll pna.  Consistent with CAP.  IV fluids and abx given in the ED.   Vitals signs improved with fluids and antipyretic.  Will dc home on z pack.  Stable for outpatient treatment.     Kathalene Frames, MD 01/19/14 Vernelle Emerald

## 2014-01-19 NOTE — Progress Notes (Signed)
Patient presents to clinic today c/o 4 days of fever, cough, sob and fatigue.  Has noted nausea, vomiting and diarrhea over the past few days.  Denies recent travel.  Works at a day care.  Denies new food or water source. Patient endorses multiple episodes of non-bloody emesis.  Is unable to keep fluids down.  Is now having a headache and her heart is racing.  Denies chest pain.  Past Medical History  Diagnosis Date  . Asthma   . Depression   . Syncope   . Migraines     Dr.Paul Hassell Done in [place]  . PCOS (polycystic ovarian syndrome)     Santiago Bumpers    Current Outpatient Prescriptions on File Prior to Visit  Medication Sig Dispense Refill  . albuterol (VENTOLIN HFA) 108 (90 BASE) MCG/ACT inhaler Inhale 2 puffs into the lungs every 6 (six) hours as needed for wheezing.  1 Inhaler  1  . beclomethasone (QVAR) 40 MCG/ACT inhaler Inhale 2 puffs into the lungs 2 (two) times daily.  1 Inhaler  6  . cyclobenzaprine (FLEXERIL) 5 MG tablet Take 5 mg by mouth 3 (three) times daily as needed. For muscle spasms.      . diclofenac (VOLTAREN) 50 MG EC tablet Take 50 mg by mouth at bedtime.      . hydrocortisone (ANUSOL-HC) 2.5 % rectal cream Place rectally 2 (two) times daily.  30 g  1  . SUMAtriptan Succinate (IMITREX IJ) Inject 6 mg as directed as needed. For migraines.      . topiramate (TOPAMAX) 25 MG tablet Take 50 mg by mouth daily.        No current facility-administered medications on file prior to visit.    Allergies  Allergen Reactions  . Codeine Hives, Shortness Of Breath and Itching  . Flagyl [Metronidazole] Nausea Only  . Hydrocodone Itching  . Oxycodone Itching    Family History  Problem Relation Age of Onset  . Breast cancer Mother 37  . Diabetes Mother   . Arthritis Mother   . Breast cancer  12    Maternal Cousin  . Diabetes Maternal Grandmother   . Arthritis Maternal Grandmother   . Diabetes Maternal Aunt   . Hyperlipidemia Paternal Aunt   . Hyperlipidemia Paternal  Uncle   . Stroke Paternal Uncle   . Hypertension    . Kidney disease Paternal Uncle   . Depression    . Heart disease Paternal Uncle 32    Sudden death    History   Social History  . Marital Status: Single    Spouse Name: N/A    Number of Children: N/A  . Years of Education: N/A   Occupational History  . Clinical cytogeneticist for [employer] History Main Topics  . Smoking status: Never Smoker   . Smokeless tobacco: Never Used  . Alcohol Use: No  . Drug Use: No  . Sexual Activity: Not Currently    Partners: Male    Birth Control/ Protection: Pill   Other Topics Concern  . None   Social History Narrative   Lives alone.   Exercise--no   Review of Systems - See HPI.  All other ROS are negative.  BP 104/82  Pulse 148  Temp(Src) 101.3 F (38.5 C) (Oral)  Resp 20  Ht 5\' 4"  (1.626 m)  Wt 235 lb 12 oz (106.935 kg)  BMI 40.45 kg/m2  SpO2 98%  LMP 01/05/2014  Physical Exam  Vitals reviewed. Constitutional: She  is oriented to person, place, and time.  HENT:  Head: Normocephalic and atraumatic.  Right Ear: External ear normal.  Left Ear: External ear normal.  Nose: Nose normal.  Mouth/Throat: Uvula is midline. Mucous membranes are pale. No oropharyngeal exudate, posterior oropharyngeal edema, posterior oropharyngeal erythema or tonsillar abscesses.  Eyes: Conjunctivae are normal. Pupils are equal, round, and reactive to light.  Neck: Neck supple.  Pulmonary/Chest: Effort normal and breath sounds normal. No respiratory distress. She has no wheezes. She has no rales. She exhibits no tenderness.  Abdominal: Soft. Bowel sounds are normal. She exhibits no distension and no mass. There is no tenderness. There is no rebound and no guarding.  Lymphadenopathy:    She has no cervical adenopathy.  Neurological: She is alert and oriented to person, place, and time.  Skin: Skin is warm and dry.  Psychiatric: Affect normal.   Assessment/Plan: Viral  illness With respiratory and GI symptoms.  Patient tachycardic at 148 bpm.  Repeat respirations at 22.  BP at lower end of normotensive.  Concern for dehydration giving poor fluid intake and persistent diarrhea/emesis.  Patient take to ER in wheelchair for IV fluids and further evaluation.

## 2014-01-19 NOTE — Assessment & Plan Note (Signed)
With respiratory and GI symptoms.  Patient tachycardic at 148 bpm.  Repeat respirations at 22.  BP at lower end of normotensive.  Concern for dehydration giving poor fluid intake and persistent diarrhea/emesis.  Patient take to ER in wheelchair for IV fluids and further evaluation.

## 2014-01-19 NOTE — ED Notes (Signed)
Pt sent from PCP upstairs for possible dehydration-pt c/o diarrhea x 4-5 days-vomiting,fever started today

## 2014-01-19 NOTE — Progress Notes (Signed)
Pre visit review using our clinic review tool, if applicable. No additional management support is needed unless otherwise documented below in the visit note/SLS  

## 2014-01-19 NOTE — ED Notes (Signed)
Patient transported to X-ray via stretcher per tech. 

## 2014-01-19 NOTE — Discharge Instructions (Signed)
Pneumonia, Adult °Pneumonia is an infection of the lungs. It may be caused by a germ (virus or bacteria). Some types of pneumonia can spread easily from person to person. This can happen when you cough or sneeze. °HOME CARE °· Only take medicine as told by your doctor. °· Take your medicine (antibiotics) as told. Finish it even if you start to feel better. °· Do not smoke. °· You may use a vaporizer or humidifier in your room. This can help loosen thick spit (mucus). °· Sleep so you are almost sitting up (semi-upright). This helps reduce coughing. °· Rest. °A shot (vaccine) can help prevent pneumonia. Shots are often advised for: °· People over 65 years old. °· Patients on chemotherapy. °· People with long-term (chronic) lung problems. °· People with immune system problems. °GET HELP RIGHT AWAY IF:  °· You are getting worse. °· You cannot control your cough, and you are losing sleep. °· You cough up blood. °· Your pain gets worse, even with medicine. °· You have a fever. °· Any of your problems are getting worse, not better. °· You have shortness of breath or chest pain. °MAKE SURE YOU:  °· Understand these instructions. °· Will watch your condition. °· Will get help right away if you are not doing well or get worse. °Document Released: 02/19/2008 Document Revised: 11/25/2011 Document Reviewed: 11/23/2010 °Exit[employer]® Patient Information ©2014 [employer]. ° °

## 2014-01-24 ENCOUNTER — Ambulatory Visit (INDEPENDENT_AMBULATORY_CARE_PROVIDER_SITE_OTHER): Payer: BC Managed Care – PPO | Admitting: Family Medicine

## 2014-01-24 ENCOUNTER — Encounter: Payer: Self-pay | Admitting: Family Medicine

## 2014-01-24 VITALS — BP 114/78 | HR 132 | Temp 99.2°F | Resp 20 | Wt 235.0 lb

## 2014-01-24 DIAGNOSIS — J189 Pneumonia, unspecified organism: Secondary | ICD-10-CM

## 2014-01-24 MED ORDER — LEVOFLOXACIN 500 MG PO TABS: ORAL_TABLET | ORAL | Status: DC

## 2014-01-24 MED ORDER — METHYLPREDNISOLONE ACETATE 80 MG/ML IJ SUSP
80.0000 mg | Freq: Once | INTRAMUSCULAR | Status: AC
  Administered 2014-01-24: 80 mg via INTRAMUSCULAR

## 2014-01-24 NOTE — Progress Notes (Signed)
Pre visit review using our clinic review tool, if applicable. No additional management support is needed unless otherwise documented below in the visit note. 

## 2014-01-24 NOTE — Patient Instructions (Signed)

## 2014-01-24 NOTE — Addendum Note (Signed)
Addended by: Ewing Schlein on: 01/24/2014 04:36 PM   Modules accepted: Orders

## 2014-01-24 NOTE — Progress Notes (Signed)
  Subjective:     Stacy Gentry is a 38 y.o. female here for evaluation of a cough. Onset of symptoms was 2 weeks ago-- pt was seen in ER 5/6. Symptoms have been unchanged since that time. The cough is barky and nonproductive and is aggravated by  activity. Associated symptoms include: chills, fever, shortness of breath and wheezing. Patient does not have a history of asthma. Patient does not have a history of environmental allergens. Patient has not traveled recently. Patient does not have a history of smoking. Patient has had a previous chest x-ray. Patient has not had a PPD done.  The following portions of the patient's history were reviewed and updated as appropriate: allergies, current medications, past family history, past medical history, past social history, past surgical history and problem list.  Review of Systems Pertinent items are noted in HPI.    Objective:    Oxygen saturation 98% on room air BP 114/78  Pulse 132  Temp(Src) 99.2 F (37.3 C) (Oral)  Resp 20  Wt 235 lb (106.595 kg)  SpO2 98%  LMP 01/06/2014 Pulse recheck 111 General appearance: alert, cooperative, appears stated age and no distress Ears: normal TM's and external ear canals both ears Nose: Nares normal. Septum midline. Mucosa normal. No drainage or sinus tenderness. Throat: lips, mucosa, and tongue normal; teeth and gums normal Neck: no adenopathy, supple, symmetrical, trachea midline and thyroid not enlarged, symmetric, no tenderness/mass/nodules Lungs: diminished breath sounds bilaterally and wheezes bilaterally Heart: S1, S2 normal Extremities: extremities normal, atraumatic, no cyanosis or edema    Assessment:    Pneumonia    Plan:    Antibiotics per medication orders. Avoid exposure to tobacco smoke and fumes. Call if shortness of breath worsens, blood in sputum, change in character of cough, development of fever or chills, inability to maintain nutrition and hydration. Avoid exposure to  tobacco smoke and fumes. pred taper and depo medrol  F/u 1 week or sooner prn Go to er if symptoms worsen

## 2014-02-04 ENCOUNTER — Encounter: Payer: Self-pay | Admitting: Family Medicine

## 2014-02-04 ENCOUNTER — Ambulatory Visit (INDEPENDENT_AMBULATORY_CARE_PROVIDER_SITE_OTHER): Payer: BC Managed Care – PPO | Admitting: Family Medicine

## 2014-02-04 ENCOUNTER — Ambulatory Visit (HOSPITAL_BASED_OUTPATIENT_CLINIC_OR_DEPARTMENT_OTHER)
Admission: RE | Admit: 2014-02-04 | Discharge: 2014-02-04 | Disposition: A | Discharge status: Home / Self Care | Payer: BC Managed Care – PPO | Source: Ambulatory Visit | Attending: Family Medicine | Admitting: Family Medicine

## 2014-02-04 VITALS — BP 110/72 | HR 113 | Temp 98.4°F | Wt 234.0 lb

## 2014-02-04 DIAGNOSIS — J189 Pneumonia, unspecified organism: Secondary | ICD-10-CM

## 2014-02-04 DIAGNOSIS — R059 Cough, unspecified: Secondary | ICD-10-CM

## 2014-02-04 DIAGNOSIS — R05 Cough: Secondary | ICD-10-CM

## 2014-02-04 MED ORDER — BENZONATATE 200 MG PO CAPS: 200.0000 mg | ORAL_CAPSULE | Freq: Two times a day (BID) | ORAL | Status: DC | PRN

## 2014-02-04 MED ORDER — CETIRIZINE HCL 10 MG PO TABS: 10.0000 mg | ORAL_TABLET | Freq: Every day | ORAL | Status: DC

## 2014-02-04 NOTE — Patient Instructions (Signed)

## 2014-02-04 NOTE — Progress Notes (Signed)
Pre visit review using our clinic review tool, if applicable. No additional management support is needed unless otherwise documented below in the visit note. 

## 2014-02-05 NOTE — Progress Notes (Signed)
   Subjective:    Patient ID: Stacy Gentry, female    DOB: [birth year], 38 y.o.   MRN: [id number]  HPI Pt here for f/u pneumonia.  She is still coughing but otherwise feels better.  abx finished   Review of Systems As above    Objective:   Physical Exam BP 110/72  Pulse 113  Temp(Src) 98.4 F (36.9 C) (Oral)  Wt 234 lb (106.142 kg)  SpO2 96%  LMP 01/06/2014 General appearance: alert, cooperative, appears stated age and no distress Ears: normal TM's and external ear canals both ears Nose: Nares normal. Septum midline. Mucosa normal. No drainage or sinus tenderness. Throat: lips, mucosa, and tongue normal; teeth and gums normal Neck: no adenopathy, no carotid bruit, no JVD, supple, symmetrical, trachea midline and thyroid not enlarged, symmetric, no tenderness/mass/nodules Lungs: clear to auscultation bilaterally Heart: S1, S2 normal        Assessment & Plan:  1. CAP (community acquired pneumonia) resolving - DG Chest 2 View; Future  2. Cough Post nasal drip-----post infectious - cetirizine (ZYRTEC) 10 MG tablet; Take 1 tablet (10 mg total) by mouth daily.  Dispense: 30 tablet; Refill: 11 - benzonatate (TESSALON) 200 MG capsule; Take 1 capsule (200 mg total) by mouth 2 (two) times daily as needed for cough.  Dispense: 20 capsule; Refill: 0

## 2014-02-28 ENCOUNTER — Emergency Department (INDEPENDENT_AMBULATORY_CARE_PROVIDER_SITE_OTHER): Payer: BC Managed Care – PPO

## 2014-02-28 ENCOUNTER — Emergency Department (HOSPITAL_COMMUNITY)
Admission: EM | Admit: 2014-02-28 | Discharge: 2014-02-28 | Disposition: A | Discharge status: Home / Self Care | Payer: BC Managed Care – PPO | Source: Home / Self Care

## 2014-02-28 ENCOUNTER — Encounter (HOSPITAL_COMMUNITY): Payer: Self-pay | Admitting: Emergency Medicine

## 2014-02-28 DIAGNOSIS — S92402A Displaced unspecified fracture of left great toe, initial encounter for closed fracture: Secondary | ICD-10-CM

## 2014-02-28 DIAGNOSIS — IMO0002 Reserved for concepts with insufficient information to code with codable children: Secondary | ICD-10-CM

## 2014-02-28 DIAGNOSIS — I959 Hypotension, unspecified: Secondary | ICD-10-CM

## 2014-02-28 DIAGNOSIS — S92919A Unspecified fracture of unspecified toe(s), initial encounter for closed fracture: Secondary | ICD-10-CM

## 2014-02-28 DIAGNOSIS — R55 Syncope and collapse: Secondary | ICD-10-CM

## 2014-02-28 DIAGNOSIS — J309 Allergic rhinitis, unspecified: Secondary | ICD-10-CM

## 2014-02-28 NOTE — ED Provider Notes (Signed)
CSN: [id number]     Arrival date & time 02/28/14  3546 History   First MD Initiated Contact with Patient 02/28/14 0911     Chief Complaint  Patient presents with  . Nasal Congestion  . Toe Pain   (Consider location/radiation/quality/duration/timing/severity/associated sxs/prior Treatment) HPI Comments: 38 year old female states that 2 days ago she stubbed her left great toe experiencing pain and swelling. The pain is greatest when ambulating. She denies other injuries.  The second complaint is that of several weeks of nasal congestion, ears feeling clogged, rhinorrhea and PND. He states she has taken all of the OTC medications.   Past Medical History  Diagnosis Date  . Asthma   . Depression   . Syncope   . Migraines     Dr.Paul Hassell Done in [place]  . PCOS (polycystic ovarian syndrome)     Santiago Bumpers   Past Surgical History  Procedure Laterality Date  . None     Family History  Problem Relation Age of Onset  . Breast cancer Mother 29  . Diabetes Mother   . Arthritis Mother   . Breast cancer  73    Maternal Cousin  . Diabetes Maternal Grandmother   . Arthritis Maternal Grandmother   . Diabetes Maternal Aunt   . Hyperlipidemia Paternal Aunt   . Hyperlipidemia Paternal Uncle   . Stroke Paternal Uncle   . Hypertension    . Kidney disease Paternal Uncle   . Depression    . Heart disease Paternal Uncle 62    Sudden death   History  Substance Use Topics  . Smoking status: Never Smoker   . Smokeless tobacco: Never Used  . Alcohol Use: No   OB History   Grav Para Term Preterm Abortions TAB SAB Ect Mult Living   0              Review of Systems  Constitutional: Negative for fever, chills, activity change, appetite change and fatigue.  HENT: Positive for congestion, postnasal drip and rhinorrhea. Negative for facial swelling, sinus pressure, sore throat and trouble swallowing.   Eyes: Negative.   Respiratory: Positive for cough. Negative for shortness of breath and  wheezing.   Cardiovascular: Negative.   Gastrointestinal: Negative.   Genitourinary: Negative.   Musculoskeletal: Negative for back pain, neck pain and neck stiffness.       As per history of present illness  Skin: Negative for pallor and rash.  Neurological: Negative.  Negative for dizziness, syncope, speech difficulty and headaches.    Allergies  Codeine; Flagyl; Hydrocodone; and Oxycodone  Home Medications   Prior to Admission medications   Medication Sig Start Date End Date Taking? Authorizing Provider  albuterol (VENTOLIN HFA) 108 (90 BASE) MCG/ACT inhaler Inhale 2 puffs into the lungs every 6 (six) hours as needed for wheezing. 09/15/13 09/15/14  Rosalita Chessman, DO  beclomethasone (QVAR) 40 MCG/ACT inhaler Inhale 2 puffs into the lungs 2 (two) times daily. 10/01/13 10/01/14  Midge Minium, MD  benzonatate (TESSALON) 200 MG capsule Take 1 capsule (200 mg total) by mouth 2 (two) times daily as needed for cough. 02/04/14   Rosalita Chessman, DO  cetirizine (ZYRTEC) 10 MG tablet Take 1 tablet (10 mg total) by mouth daily. 02/04/14   Rosalita Chessman, DO  cyclobenzaprine (FLEXERIL) 5 MG tablet Take 5 mg by mouth 3 (three) times daily as needed. For muscle spasms. 07/15/12   Historical Provider, MD  diclofenac (VOLTAREN) 50 MG EC tablet Take 50 mg  by mouth at bedtime.    Historical Provider, MD  hydrocortisone (ANUSOL-HC) 2.5 % rectal cream Place rectally 2 (two) times daily. 06/10/13   Billy Fischer, MD  naratriptan (AMERGE) 2.5 MG tablet  01/17/14   Historical Provider, MD  ondansetron (ZOFRAN ODT) 8 MG disintegrating tablet Take 1 tablet (8 mg total) by mouth every 8 (eight) hours as needed for nausea or vomiting. 01/19/14   Dorie Rank, MD  SUMAtriptan Succinate (IMITREX IJ) Inject 6 mg as directed as needed. For migraines.    Historical Provider, MD  topiramate (TOPAMAX) 25 MG tablet Take 50 mg by mouth daily.     Historical Provider, MD   BP 126/85  Pulse 95  Temp(Src) 97.1 F (36.2 C)  (Oral)  Resp 18  SpO2 100%  LMP 02/19/2014 Physical Exam  Nursing note and vitals reviewed. Constitutional: She is oriented to person, place, and time. She appears well-developed and well-nourished. No distress.  HENT:  Bilateral TMs are normal Oropharynx with moderate erythema and copious amount of thick clear discharge. No exudates or swelling.  Eyes: Conjunctivae and EOM are normal. Pupils are equal, round, and reactive to light.  Neck: Normal range of motion. Neck supple.  Cardiovascular: Normal rate, regular rhythm and normal heart sounds.   Pulmonary/Chest: Effort normal and breath sounds normal. No respiratory distress. She has no wheezes. She has no rales.  Musculoskeletal:  Swelling and tenderness to the left great toe. Very limited range of motion due to pain. Tenderness is greatest over the DIP. Capillary refill is less than 2 seconds.  Lymphadenopathy:    She has no cervical adenopathy.  Neurological: She is alert and oriented to person, place, and time.  Skin: Skin is warm and dry. No rash noted.  Psychiatric: She has a normal mood and affect.    ED Course  Procedures (including critical care time) Labs Review Labs Reviewed - No data to display  Imaging Review Dg Foot Complete Left  02/28/2014   CLINICAL DATA:  Persistent great toe pain status post trauma 2 days ago  EXAM: LEFT FOOT - COMPLETE 3+ VIEW  COMPARISON:  None.  FINDINGS: The bones are adequately mineralized. The interphalangeal and metatarsophalangeal joints are normal. There is minimal irregularity of the lateral aspect of the base of the distal phalanx of the great toe. This may be developmental but a subtle fracture is not excluded. The proximal phalanx is normal. The metatarsals and the tarsal bones are intact. There is soft tissue swelling over the forefoot.  IMPRESSION: There is significant soft tissue swelling over the dorsum of the forefoot. A non displaced fracture of the lateral aspect of the base of  the distal phalanx is suspected.   Electronically Signed   By: Aariv Medlock  [place]   On: 02/28/2014 09:31     MDM   1. Closed fracture of great toe of left foot   2. Allergic rhinosinusitis   3. Vasovagal reaction   4. Near syncope   5. Hypotension     Almost immediately after the physical exam for her upper respiratory symptoms she became hot and sweaty stating that she needed to lie down. She began to increase her respiratory rate and depth. States she felt fainty . After placing her flat her blood pressure was 97/68. 5 minutes later it was 100/66. She has remained conscious, speaking in complete sentences, coherent and answering questions. Respirations are slowing down over the next few minutes. Observation 60 minutes. At 1055 pt st feels back to  nl. BP standing is normalized. D/C in stable condition.   OTC medications written for allergy symptoms. Postop shoe and buddy tape toes. Followup with PCP as needed for allergy symptoms and orthopedist for your toe injury.     Janne Napoleon, NP 02/28/14 1100

## 2014-02-28 NOTE — Discharge Instructions (Signed)
Allergic Rhinitis Allegra 180 mg a day flonase nasal spray Lots of nasal saline spray often Sudafed PE 10 mg for congestion Robitussin DM for cough For stronger antihistamine for drainage use Chlortrimeton. Will cause drowsiness Allergic rhinitis is when the mucous membranes in the nose respond to allergens. Allergens are particles in the air that cause your body to have an allergic reaction. This causes you to release allergic antibodies. Through a chain of events, these eventually cause you to release histamine into the blood stream. Although meant to protect the body, it is this release of histamine that causes your discomfort, such as frequent sneezing, congestion, and an itchy, runny nose.  CAUSES  Seasonal allergic rhinitis (hay fever) is caused by pollen allergens that may come from grasses, trees, and weeds. Year-round allergic rhinitis (perennial allergic rhinitis) is caused by allergens such as house dust mites, pet dander, and mold spores.  SYMPTOMS   Nasal stuffiness (congestion).  Itchy, runny nose with sneezing and tearing of the eyes. DIAGNOSIS  Your health care provider can help you determine the allergen or allergens that trigger your symptoms. If you and your health care provider are unable to determine the allergen, skin or blood testing may be used. TREATMENT  Allergic Rhinitis does not have a cure, but it can be controlled by:  Medicines and allergy shots (immunotherapy).  Avoiding the allergen. Hay fever may often be treated with antihistamines in pill or nasal spray forms. Antihistamines block the effects of histamine. There are over-the-counter medicines that may help with nasal congestion and swelling around the eyes. Check with your health care provider before taking or giving this medicine.  If avoiding the allergen or the medicine prescribed do not work, there are many new medicines your health care provider can prescribe. Stronger medicine may be used if initial  measures are ineffective. Desensitizing injections can be used if medicine and avoidance does not work. Desensitization is when a patient is given ongoing shots until the body becomes less sensitive to the allergen. Make sure you follow up with your health care provider if problems continue. HOME CARE INSTRUCTIONS It is not possible to completely avoid allergens, but you can reduce your symptoms by taking steps to limit your exposure to them. It helps to know exactly what you are allergic to so that you can avoid your specific triggers. SEEK MEDICAL CARE IF:   You have a fever.  You develop a cough that does not stop easily (persistent).  You have shortness of breath.  You start wheezing.  Symptoms interfere with normal daily activities. Document Released: 05/28/2001 Document Revised: 06/23/2013 Document Reviewed: 05/10/2013 [facility] Patient Information 2014 [place].  Buddy Taping of Toes We have taped your toes together to keep them from moving. This is called "buddy taping" since we used a part of your own body to keep the injured part still. We placed soft padding between your toes to keep them from rubbing against each other. Buddy taping will help with healing and to reduce pain. Keep your toes buddy taped together for as long as directed by your caregiver. HOME CARE INSTRUCTIONS   Raise your injured area above the level of your heart while sitting or lying down. Prop it up with pillows.  An ice pack used every twenty minutes, while awake, for the first one to two days may be helpful. Put ice in a plastic bag and put a towel between the bag and your skin.  Watch for signs that the taping is  too tight. These signs may be:  Numbness of your taped toes.  Coolness of your taped toes.  Color change in the area beyond the tape.  Increased pain.  If you have any of these signs, loosen or rewrap the tape. If you need to loosen or rewrap the buddy tape, make sure you use the  padding again. SEEK IMMEDIATE MEDICAL CARE IF:   You have worse pain, swelling, inflammation (soreness), drainage or bleeding after you rewrap the tape.  Any new problems occur. MAKE SURE YOU:   Understand these instructions.  Will watch your condition.  Will get help right away if you are not doing well or get worse. Document Released: 06/06/2004 Document Revised: 11/25/2011 Document Reviewed: 08/30/2008 [facility] Patient Information 2014 [place].  Cast or Splint Care Casts and splints support injured limbs and keep bones from moving while they heal.  HOME CARE  Keep the cast or splint uncovered during the drying period.  A plaster cast can take 24 to 48 hours to dry.  A fiberglass cast will dry in less than 1 hour.  Do not rest the cast on anything harder than a pillow for 24 hours.  Do not put weight on your injured limb. Do not put pressure on the cast. Wait for your doctor's approval.  Keep the cast or splint dry.  Cover the cast or splint with a plastic bag during baths or wet weather.  If you have a cast over your chest and belly (trunk), take sponge baths until the cast is taken off.  If your cast gets wet, dry it with a towel or blow dryer. Use the cool setting on the blow dryer.  Keep your cast or splint clean. Wash a dirty cast with a damp cloth.  Do not put any objects under your cast or splint.  Do not scratch the skin under the cast with an object. If itching is a problem, use a blow dryer on a cool setting over the itchy area.  Do not trim or cut your cast.  Do not take out the padding from inside your cast.  Exercise your joints near the cast as told by your doctor.  Raise (elevate) your injured limb on 1 or 2 pillows for the first 1 to 3 days. GET HELP IF:  Your cast or splint cracks.  Your cast or splint is too tight or too loose.  You itch badly under the cast.  Your cast gets wet or has a soft spot.  You have a bad smell coming  from the cast.  You get an object stuck under the cast.  Your skin around the cast becomes red or sore.  You have new or more pain after the cast is put on. GET HELP RIGHT AWAY IF:  You have fluid leaking through the cast.  You cannot move your fingers or toes.  Your fingers or toes turn blue or white or are cool, painful, or puffy (swollen).  You have tingling or lose feeling (numbness) around the injured area.  You have bad pain or pressure under the cast.  You have trouble breathing or have shortness of breath.  You have chest pain. Document Released: 01/02/2011 Document Revised: 05/05/2013 Document Reviewed: 03/11/2013 [facility] Patient Information 2014 [place].  Near-Syncope Near-syncope (commonly known as near fainting) is sudden weakness, dizziness, or feeling like you might pass out. During an episode of near-syncope, you may also develop pale skin, have tunnel vision, or feel sick to your stomach (  nauseous). Near-syncope may occur when getting up after sitting or while standing for a long time. It is caused by a sudden decrease in blood flow to the brain. This decrease can result from various causes or triggers, most of which are not serious. However, because near-syncope can sometimes be a sign of something serious, a medical evaluation is required. The specific cause is often not determined. HOME CARE INSTRUCTIONS  Monitor your condition for any changes. The following actions may help to alleviate any discomfort you are experiencing:  Have someone stay with you until you feel stable.  Lie down right away if you start feeling like you might faint. Breathe deeply and steadily. Wait until all the symptoms have passed. Most of these episodes last only a few minutes. You may feel tired for several hours.   Drink enough fluids to keep your urine clear or pale yellow.   If you are taking blood pressure or heart medicine, get up slowly when seated or lying down. Take  several minutes to sit and then stand. This can reduce dizziness.  Follow up with your health care provider as directed. SEEK IMMEDIATE MEDICAL CARE IF:   You have a severe headache.   You have unusual pain in the chest, abdomen, or back.   You are bleeding from the mouth or rectum, or you have black or tarry stool.   You have an irregular or very fast heartbeat.   You have repeated fainting or have seizure-like jerking during an episode.   You faint when sitting or lying down.   You have confusion.   You have difficulty walking.   You have severe weakness.   You have vision problems.  MAKE SURE YOU:   Understand these instructions.  Will watch your condition.  Will get help right away if you are not doing well or get worse. Document Released: 09/02/2005 Document Revised: 05/05/2013 Document Reviewed: 02/05/2013 [facility] Patient Information 2014 [place].  Toe Fracture Your caregiver has diagnosed you as having a fractured toe. A toe fracture is a break in the bone of a toe. "Buddy taping" is a way of splinting your broken toe, by taping the broken toe to the toe next to it. This "buddy taping" will keep the injured toe from moving beyond normal range of motion. Buddy taping also helps the toe heal in a more normal alignment. It may take 6 to 8 weeks for the toe injury to heal. [place] your toes taped together for as long as directed by your caregiver or until you see a doctor for a follow-up examination. You can change the tape after bathing. Always use a small piece of gauze or cotton between the toes when taping them together. This will help the skin stay dry and prevent infection.  Apply ice to the injury for 15-20 minutes each hour while awake for the first 2 days. Put the ice in a plastic bag and place a towel between the bag of ice and your skin.  After the first 2 days, apply heat to the injured area. Use heat for the next 2  to 3 days. Place a heating pad on the foot or soak the foot in warm water as directed by your caregiver.  Keep your foot elevated as much as possible to lessen swelling.  Wear sturdy, supportive shoes. The shoes should not pinch the toes or fit tightly against the toes.  Your caregiver may prescribe a rigid shoe if your foot  is very swollen.  Your may be given crutches if the pain is too great and it hurts too much to walk.  Only take over-the-counter or prescription medicines for pain, discomfort, or fever as directed by your caregiver.  If your caregiver has given you a follow-up appointment, it is very important to keep that appointment. Not keeping the appointment could result in a chronic or permanent injury, pain, and disability. If there is any problem keeping the appointment, you must call back to this facility for assistance. SEEK MEDICAL CARE IF:   You have increased pain or swelling, not relieved with medications.  The pain does not get better after 1 week.  Your injured toe is cold when the others are warm. SEEK IMMEDIATE MEDICAL CARE IF:   The toe becomes cold, numb, or white.  The toe becomes hot (inflamed) and red. Document Released: 08/30/2000 Document Revised: 11/25/2011 Document Reviewed: 04/18/2008 [facility] Patient Information 2014 [place].  Vasovagal Syncope, Adult Syncope, commonly known as fainting, is a temporary loss of consciousness. It occurs when the blood flow to the brain is reduced. Vasovagal syncope (also called neurocardiogenic syncope) is a fainting spell in which the blood flow to the brain is reduced because of a sudden drop in heart rate and blood pressure. Vasovagal syncope occurs when the brain and the cardiovascular system (blood vessels) do not adequately communicate and respond to each other. This is the most common cause of fainting. It often occurs in response to fear or some other type of emotional or physical stress. The body has a  reaction in which the heart starts beating too slowly or the blood vessels expand, reducing blood pressure. This type of fainting spell is generally considered harmless. However, injuries can occur if a person takes a sudden fall during a fainting spell.  CAUSES  Vasovagal syncope occurs when a person's blood pressure and heart rate decrease suddenly, usually in response to a trigger. Many things and situations can trigger an episode. Some of these include:   Pain.   Fear.   The sight of blood or medical procedures, such as blood being drawn from a vein.   Common activities, such as coughing, swallowing, stretching, or going to the bathroom.   Emotional stress.   Prolonged standing, especially in a warm environment.   Lack of sleep or rest.   Prolonged lack of food.   Prolonged lack of fluids.   Recent illness.  The use of certain drugs that affect blood pressure, such as cocaine, alcohol, marijuana, inhalants, and opiates.  SYMPTOMS  Before the fainting episode, you may:   Feel dizzy or light headed.   Become pale.  Sense that you are going to faint.   Feel like the room is spinning.   Have tunnel vision, only seeing directly in front of you.   Feel sick to your stomach (nauseous).   See spots or slowly lose vision.   Hear ringing in your ears.   Have a headache.   Feel warm and sweaty.   Feel a sensation of pins and needles. During the fainting spell, you will generally be unconscious for no longer than a couple minutes before waking up and returning to normal. If you get up too quickly before your body can recover, you may faint again. Some twitching or jerky movements may occur during the fainting spell.  DIAGNOSIS  Your caregiver will ask about your symptoms, take a medical history, and perform a physical exam. Various tests may be done  to rule out other causes of fainting. These may include blood tests and tests to check the heart, such as  electrocardiography, echocardiography, and possibly an electrophysiology study. When other causes have been ruled out, a test may be done to check the body's response to changes in position (tilt table test). TREATMENT  Most cases of vasovagal syncope do not require treatment. Your caregiver may recommend ways to avoid fainting triggers and may provide home strategies for preventing fainting. If you must be exposed to a possible trigger, you can drink additional fluids to help reduce your chances of having an episode of vasovagal syncope. If you have warning signs of an oncoming episode, you can respond by positioning yourself favorably (lying down). If your fainting spells continue, you may be given medicines to prevent fainting. Some medicines may help make you more resistant to repeated episodes of vasovagal syncope. Special exercises or compression stockings may be recommended. In rare cases, the surgical placement of a pacemaker is considered. HOME CARE INSTRUCTIONS   Learn to identify the warning signs of vasovagal syncope.   Sit or lie down at the first warning sign of a fainting spell. If sitting, put your head down between your legs. If you lie down, swing your legs up in the air to increase blood flow to the brain.   Avoid hot tubs and saunas.  Avoid prolonged standing.  Drink enough fluids to keep your urine clear or pale yellow. Avoid caffeine.  Increase salt in your diet as directed by your caregiver.   If you have to stand for a long time, perform movements such as:   Crossing your legs.   Flexing and stretching your leg muscles.   Squatting.   Moving your legs.   Bending over.   Only take over-the-counter or prescription medicines as directed by your caregiver. Do not suddenly stop any medicines without asking your caregiver first. SEEK MEDICAL CARE IF:   Your fainting spells continue or happen more frequently in spite of treatment.   You lose  consciousness for more than a couple minutes.  You have fainting spells during or after exercising or after being startled.   You have new symptoms that occur with the fainting spells, such as:   Shortness of breath.  Chest pain.   Irregular heartbeat.   You have episodes of twitching or jerky movements that last longer than a few seconds.  You have episodes of twitching or jerky movements without obvious fainting. SEEK IMMEDIATE MEDICAL CARE IF:   You have injuries or bleeding after a fainting spell.   You have episodes of twitching or jerky movements that last longer than 5 minutes.   You have more than one spell of twitching or jerky movements before returning to consciousness after fainting. MAKE SURE YOU:   Understand these instructions.  Will watch your condition.  Will get help right away if you are not doing well or get worse. Document Released: 08/19/2012 Document Reviewed: 08/19/2012 [facility] Patient Information 2014 [place], [place].

## 2014-02-28 NOTE — ED Notes (Signed)
Patient is drinking Gatorade

## 2014-02-28 NOTE — ED Notes (Signed)
Checked on patient and she is doing okay

## 2014-02-28 NOTE — ED Notes (Signed)
C/o nasal congestion with yellow green mucous States her ears are stopped up States she feels drainage in her throat OTC meds given Does have hx of pneumonia one month ago   On Saturday patient went to [place] and hit her left big toe on a ride States she is not able to bend toe Pain is radiating up the back of her leg

## 2014-03-02 NOTE — ED Provider Notes (Signed)
Medical screening examination/treatment/procedure(s) were performed by non-physician practitioner and as supervising physician I was immediately available for consultation/collaboration.  Philipp Deputy, M.D.  Harden Mo, MD 03/02/14 [phone]

## 2014-03-07 ENCOUNTER — Emergency Department (HOSPITAL_BASED_OUTPATIENT_CLINIC_OR_DEPARTMENT_OTHER): Payer: BC Managed Care – PPO

## 2014-03-07 ENCOUNTER — Emergency Department (HOSPITAL_BASED_OUTPATIENT_CLINIC_OR_DEPARTMENT_OTHER)
Admission: EM | Admit: 2014-03-07 | Discharge: 2014-03-08 | Disposition: A | Discharge status: Home / Self Care | Payer: BC Managed Care – PPO | Attending: Emergency Medicine | Admitting: Emergency Medicine

## 2014-03-07 ENCOUNTER — Encounter (HOSPITAL_BASED_OUTPATIENT_CLINIC_OR_DEPARTMENT_OTHER): Payer: Self-pay | Admitting: Emergency Medicine

## 2014-03-07 DIAGNOSIS — J3489 Other specified disorders of nose and nasal sinuses: Secondary | ICD-10-CM | POA: Insufficient documentation

## 2014-03-07 DIAGNOSIS — Z791 Long term (current) use of non-steroidal anti-inflammatories (NSAID): Secondary | ICD-10-CM | POA: Insufficient documentation

## 2014-03-07 DIAGNOSIS — Z8701 Personal history of pneumonia (recurrent): Secondary | ICD-10-CM | POA: Insufficient documentation

## 2014-03-07 DIAGNOSIS — G43909 Migraine, unspecified, not intractable, without status migrainosus: Secondary | ICD-10-CM | POA: Diagnosis not present

## 2014-03-07 DIAGNOSIS — Z79899 Other long term (current) drug therapy: Secondary | ICD-10-CM | POA: Insufficient documentation

## 2014-03-07 DIAGNOSIS — J45901 Unspecified asthma with (acute) exacerbation: Secondary | ICD-10-CM | POA: Insufficient documentation

## 2014-03-07 DIAGNOSIS — R000 Tachycardia, unspecified: Secondary | ICD-10-CM | POA: Diagnosis not present

## 2014-03-07 DIAGNOSIS — Z8639 Personal history of other endocrine, nutritional and metabolic disease: Secondary | ICD-10-CM | POA: Insufficient documentation

## 2014-03-07 DIAGNOSIS — R059 Cough, unspecified: Secondary | ICD-10-CM | POA: Diagnosis present

## 2014-03-07 DIAGNOSIS — IMO0002 Reserved for concepts with insufficient information to code with codable children: Secondary | ICD-10-CM | POA: Insufficient documentation

## 2014-03-07 DIAGNOSIS — Z8659 Personal history of other mental and behavioral disorders: Secondary | ICD-10-CM | POA: Insufficient documentation

## 2014-03-07 DIAGNOSIS — R05 Cough: Secondary | ICD-10-CM | POA: Diagnosis present

## 2014-03-07 DIAGNOSIS — Z862 Personal history of diseases of the blood and blood-forming organs and certain disorders involving the immune mechanism: Secondary | ICD-10-CM | POA: Diagnosis not present

## 2014-03-07 LAB — COMPREHENSIVE METABOLIC PANEL
ALT: 10 U/L (ref 0–35)
AST: 22 U/L (ref 0–37)
Albumin: 4.2 g/dL (ref 3.5–5.2)
Alkaline Phosphatase: 64 U/L (ref 39–117)
BUN: 8 mg/dL (ref 6–23)
CO2: 19 mEq/L (ref 19–32)
Calcium: 9.4 mg/dL (ref 8.4–10.5)
Chloride: 101 mEq/L (ref 96–112)
Creatinine, Ser: 1 mg/dL (ref 0.50–1.10)
GFR calc non Af Amer: 71 mL/min — ABNORMAL LOW (ref >90)
GFR, EST AFRICAN AMERICAN: 82 mL/min — AB (ref >90)
Glucose, Bld: 109 mg/dL — ABNORMAL HIGH (ref 70–99)
POTASSIUM: 3.8 meq/L (ref 3.7–5.3)
Sodium: 137 mEq/L (ref 137–147)
TOTAL PROTEIN: 8.3 g/dL (ref 6.0–8.3)
Total Bilirubin: 0.3 mg/dL (ref 0.3–1.2)

## 2014-03-07 LAB — D-DIMER, QUANTITATIVE (NOT AT ARMC): D DIMER QUANT: 0.66 ug{FEU}/mL — AB (ref 0.00–0.48)

## 2014-03-07 LAB — CBC WITH DIFFERENTIAL/PLATELET
BASOS PCT: 0 % (ref 0–1)
Basophils Absolute: 0 10*3/uL (ref 0.0–0.1)
EOS PCT: 1 % (ref 0–5)
Eosinophils Absolute: 0.1 10*3/uL (ref 0.0–0.7)
HCT: 37.2 % (ref 36.0–46.0)
Hemoglobin: 12.7 g/dL (ref 12.0–15.0)
Lymphocytes Relative: 24 % (ref 12–46)
Lymphs Abs: 4.4 10*3/uL — ABNORMAL HIGH (ref 0.7–4.0)
MCH: 28.5 pg (ref 26.0–34.0)
MCHC: 34.1 g/dL (ref 30.0–36.0)
MCV: 83.4 fL (ref 78.0–100.0)
MONOS PCT: 8 % (ref 3–12)
Monocytes Absolute: 1.4 10*3/uL — ABNORMAL HIGH (ref 0.1–1.0)
NEUTROS PCT: 67 % (ref 43–77)
Neutro Abs: 12.1 10*3/uL — ABNORMAL HIGH (ref 1.7–7.7)
Platelets: 331 10*3/uL (ref 150–400)
RBC: 4.46 MIL/uL (ref 3.87–5.11)
RDW: 13.5 % (ref 11.5–15.5)
WBC: 18.1 10*3/uL — ABNORMAL HIGH (ref 4.0–10.5)

## 2014-03-07 LAB — TROPONIN I

## 2014-03-07 MED ORDER — PREDNISONE 20 MG PO TABS: ORAL_TABLET | ORAL | Status: DC

## 2014-03-07 MED ORDER — METHYLPREDNISOLONE SODIUM SUCC 125 MG IJ SOLR
125.0000 mg | Freq: Once | INTRAMUSCULAR | Status: AC
  Administered 2014-03-07: 125 mg via INTRAVENOUS
  Filled 2014-03-07: qty 2

## 2014-03-07 MED ORDER — SODIUM CHLORIDE 0.9 % IV BOLUS (SEPSIS)
1000.0000 mL | Freq: Once | INTRAVENOUS | Status: AC
  Administered 2014-03-07: 1000 mL via INTRAVENOUS

## 2014-03-07 MED ORDER — IOHEXOL 350 MG/ML SOLN: 80.0000 mL | Freq: Once | INTRAVENOUS | Status: AC | PRN

## 2014-03-07 NOTE — Discharge Instructions (Signed)
Take prednisone as prescribed.   Use albuterol every 4 hrs for 2 days then as needed.  Follow up with your doctor.   Return to ER if you have worse shortness of breath, chest pain, palpitations.

## 2014-03-07 NOTE — ED Notes (Signed)
Pt states that she gets very anxious in hospitals and her heart races, NAD noted.

## 2014-03-07 NOTE — ED Provider Notes (Addendum)
CSN: [id number]     Arrival date & time 03/07/14  2003 History  This chart was scribed for Wandra Arthurs, MD by Irene Pap, ED Scribe. This patient was seen in room MH12/MH12 and patient care was started at 9:23 PM.       Chief Complaint  Patient presents with  . URI   The history is provided by the patient. No language interpreter was used.   HPI Comments: Stacy Gentry is a 38 y.o. female who presents to the Emergency Department complaining of gradually worsening congestion onset three weeks ago. The patient states that the congestion began mostly as maxillofacial, but has begun to go down her throat. Patient reports associated wheezing, for which she has tried a nebulizer and inhaler to no relief. Patient reports that she was seen last week for a fractured toe, and she told her PCP about the congestion that she has been experiencing and was told to take OTC medications, such as Sudafed, Flonase and Claritin that she has taken without relief. She was previously seen a month ago for pneumonia.  The patient reports that she has a history of high HR. She has had an EKG and stress test done with negative results. She states that she has been seen by a doctor and was told that the high HR could possibly a murmur.  Past Medical History  Diagnosis Date  . Asthma   . Depression   . Syncope   . Migraines     Dr.Paul Hassell Done in [place]  . PCOS (polycystic ovarian syndrome)     S[facility] Bumpers   Past Surgical History  Procedure Laterality Date  . None     Family History  Problem Relation Age of Onset  . Breast cancer Mother 4  . Diabetes Mother   . Arthritis Mother   . Breast cancer  57    Maternal Cousin  . Diabetes Maternal Grandmother   . Arthritis Maternal Grandmother   . Diabetes Maternal Aunt   . Hyperlipidemia Paternal Aunt   . Hyperlipidemia Paternal Uncle   . Stroke Paternal Uncle   . Hypertension    . Kidney disease Paternal Uncle   . Depression    . Heart disease  Paternal Uncle 30    Sudden death   History  Substance Use Topics  . Smoking status: Never Smoker   . Smokeless tobacco: Never Used  . Alcohol Use: No   OB History   Grav Para Term Preterm Abortions TAB SAB Ect Mult Living   0              Review of Systems  Constitutional: Negative for fever.  HENT: Positive for congestion.   Respiratory: Positive for cough and wheezing.   All other systems reviewed and are negative.     Allergies  Codeine; Flagyl; Hydrocodone; and Oxycodone  Home Medications   Prior to Admission medications   Medication Sig Start Date End Date Taking? Authorizing Marquee Fuchs  albuterol (VENTOLIN HFA) 108 (90 BASE) MCG/ACT inhaler Inhale 2 puffs into the lungs every 6 (six) hours as needed for wheezing. 09/15/13 09/15/14  Rosalita Chessman, DO  beclomethasone (QVAR) 40 MCG/ACT inhaler Inhale 2 puffs into the lungs 2 (two) times daily. 10/01/13 10/01/14  Midge Minium, MD  benzonatate (TESSALON) 200 MG capsule Take 1 capsule (200 mg total) by mouth 2 (two) times daily as needed for cough. 02/04/14   Rosalita Chessman, DO  cetirizine (ZYRTEC) 10 MG tablet Take 1  tablet (10 mg total) by mouth daily. 02/04/14   Rosalita Chessman, DO  cyclobenzaprine (FLEXERIL) 5 MG tablet Take 5 mg by mouth 3 (three) times daily as needed. For muscle spasms. 07/15/12   Historical Marketa Midkiff, MD  diclofenac (VOLTAREN) 50 MG EC tablet Take 50 mg by mouth at bedtime.    Historical Min Tunnell, MD  hydrocortisone (ANUSOL-HC) 2.5 % rectal cream Place rectally 2 (two) times daily. 06/10/13   Billy Fischer, MD  naratriptan (AMERGE) 2.5 MG tablet  01/17/14   Historical Jadelynn Boylan, MD  ondansetron (ZOFRAN ODT) 8 MG disintegrating tablet Take 1 tablet (8 mg total) by mouth every 8 (eight) hours as needed for nausea or vomiting. 01/19/14   Dorie Rank, MD  SUMAtriptan Succinate (IMITREX IJ) Inject 6 mg as directed as needed. For migraines.    Historical Fiona Coto, MD  topiramate (TOPAMAX) 25 MG tablet Take 50 mg by  mouth daily.     Historical Jassen Sarver, MD   BP 150/82  Pulse 150  Temp(Src) 98.2 F (36.8 C) (Oral)  Resp 22  Ht 5\' 4"  (1.626 m)  Wt 230 lb (104.327 kg)  BMI 39.46 kg/m2  SpO2 100%  LMP 02/19/2014 Physical Exam  Nursing note and vitals reviewed. Constitutional: She is oriented to person, place, and time. She appears well-developed and well-nourished.  HENT:  Head: Normocephalic and atraumatic.  Eyes: Conjunctivae and EOM are normal.  Neck: Normal range of motion. Neck supple.  Cardiovascular: Regular rhythm and normal heart sounds.  Tachycardia present.   No murmur heard. Pulmonary/Chest: Effort normal and breath sounds normal. No respiratory distress. She has no wheezes. She has no rales.  Musculoskeletal: Normal range of motion. She exhibits edema (LLE).  Neurological: She is alert and oriented to person, place, and time.  Skin: Skin is warm and dry.  Psychiatric: She has a normal mood and affect. Her behavior is normal.    ED Course  Procedures (including critical care time) DIAGNOSTIC STUDIES: Oxygen Saturation is 100% on room air, normal by my interpretation.    COORDINATION OF CARE: 9:26 PM-Discussed treatment plan which includes imaging, steroids and labs with pt at bedside and pt agreed to plan.   Medications  iohexol (OMNIPAQUE) 350 MG/ML injection 80 mL (not administered)  sodium chloride 0.9 % bolus 1,000 mL (1,000 mLs Intravenous New Bag/Given 03/07/14 2145)  methylPREDNISolone sodium succinate (SOLU-MEDROL) 125 mg/2 mL injection 125 mg (125 mg Intravenous Given 03/07/14 2146)    Results for orders placed during the hospital encounter of 03/07/14  CBC WITH DIFFERENTIAL      Result Value Ref Range   WBC 18.1 (*) 4.0 - 10.5 K/uL   RBC 4.46  3.87 - 5.11 MIL/uL   Hemoglobin 12.7  12.0 - 15.0 g/dL   HCT 37.2  36.0 - 46.0 %   MCV 83.4  78.0 - 100.0 fL   MCH 28.5  26.0 - 34.0 pg   MCHC 34.1  30.0 - 36.0 g/dL   RDW 13.5  11.5 - 15.5 %   Platelets 331  150 - 400  K/uL   Neutrophils Relative % 67  43 - 77 %   Neutro Abs 12.1 (*) 1.7 - 7.7 K/uL   Lymphocytes Relative 24  12 - 46 %   Lymphs Abs 4.4 (*) 0.7 - 4.0 K/uL   Monocytes Relative 8  3 - 12 %   Monocytes Absolute 1.4 (*) 0.1 - 1.0 K/uL   Eosinophils Relative 1  0 - 5 %   Eosinophils  Absolute 0.1  0.0 - 0.7 K/uL   Basophils Relative 0  0 - 1 %   Basophils Absolute 0.0  0.0 - 0.1 K/uL  COMPREHENSIVE METABOLIC PANEL      Result Value Ref Range   Sodium 137  137 - 147 mEq/L   Potassium 3.8  3.7 - 5.3 mEq/L   Chloride 101  96 - 112 mEq/L   CO2 19  19 - 32 mEq/L   Glucose, Bld 109 (*) 70 - 99 mg/dL   BUN 8  6 - 23 mg/dL   Creatinine, Ser 1.00  0.50 - 1.10 mg/dL   Calcium 9.4  8.4 - 10.5 mg/dL   Total Protein 8.3  6.0 - 8.3 g/dL   Albumin 4.2  3.5 - 5.2 g/dL   AST 22  0 - 37 U/L   ALT 10  0 - 35 U/L   Alkaline Phosphatase 64  39 - 117 U/L   Total Bilirubin 0.3  0.3 - 1.2 mg/dL   GFR calc non Af Amer 71 (*) >90 mL/min   GFR calc Af Amer 82 (*) >90 mL/min  D-DIMER, QUANTITATIVE      Result Value Ref Range   D-Dimer, Quant 0.66 (*) 0.00 - 0.48 ug/mL-FEU  TROPONIN I      Result Value Ref Range   Troponin I <0.30  <0.30 ng/mL   Dg Chest 2 View  03/07/2014   CLINICAL DATA:  Wheezing and shortness of breath. Recent diagnosis of pneumonia 1 month ago.  EXAM: CHEST  2 VIEW  COMPARISON:  02/04/2014  FINDINGS: The heart size and mediastinal contours are within normal limits. Both lungs are clear. The visualized skeletal structures are unremarkable.  IMPRESSION: No active cardiopulmonary disease.   Electronically Signed   By: Lucienne Capers M.D.   On: 03/07/2014 21:16   Ct Angio Chest Pe W/cm &/or Wo Cm  03/07/2014   CLINICAL DATA:  Shortness of breath.  Chest pain.  Leg pain.  EXAM: CT ANGIOGRAPHY CHEST WITH CONTRAST  TECHNIQUE: Multidetector CT imaging of the chest was performed using the standard protocol during bolus administration of intravenous contrast. Multiplanar CT image reconstructions  and MIPs were obtained to evaluate the vascular anatomy.  CONTRAST:  100 mL Omnipaque 350  COMPARISON:  None.  FINDINGS: Technically limited study due to poor contrast bolus timing. There is limited opacification of the central and proximal segmental pulmonary arteries. No large central filling defects are identified. Distal segmental and peripheral vessels are less well visualized and peripheral emboli could be obscured.  Normal heart size. Normal caliber thoracic aorta. No evidence of dissection. No significant lymphadenopathy in the chest. The esophagus is decompressed. No pleural effusions. Visualized portions of the upper abdominal organs are grossly unremarkable. The lungs are clear. No focal consolidation or airspace disease. Airways appear patent. No pneumothorax. No destructive bone lesions. Mild degenerative changes in the thoracic spine.  Review of the MIP images confirms the above findings.  IMPRESSION: Technically limited study. No large central pulmonary emboli are identified. Peripheral emboli may be obscured due to technique. No evidence of active pulmonary disease.   Electronically Signed   By: Lucienne Capers M.D.   On: 03/07/2014 22:45   [place] Extrem Low Left Comp  03/07/2014   CLINICAL DATA:  Left lower extremity swelling, toe fracture 1 week ago.  EXAM: Left LOWER EXTREMITY VENOUS DOPPLER ULTRASOUND  TECHNIQUE: Gray-scale sonography with graded compression, as well as color Doppler and duplex ultrasound were performed to evaluate the  lower extremity deep venous systems from the level of the common femoral vein and including the common femoral, femoral, profunda femoral, popliteal and calf veins including the posterior tibial, peroneal and gastrocnemius veins when visible. The superficial great saphenous vein was also interrogated. Spectral Doppler was utilized to evaluate flow at rest and with distal augmentation maneuvers in the common femoral, femoral and popliteal veins.  COMPARISON:  None.   FINDINGS: Common Femoral Vein: No evidence of thrombus. Normal compressibility, respiratory phasicity and response to augmentation.  Saphenofemoral Junction: No evidence of thrombus. Normal compressibility and flow on color Doppler imaging.  Profunda Femoral Vein: No evidence of thrombus. Normal flow on color Doppler imaging.  Femoral Vein: No evidence of thrombus. Normal compressibility, respiratory phasicity and response to augmentation.  Popliteal Vein: No evidence of thrombus. Normal compressibility, respiratory phasicity and response to augmentation.  Calf Veins: No evidence of thrombus. Normal compressibility and flow on color Doppler imaging.  Superficial Great Saphenous Vein: No evidence of thrombus.  Other Findings:  None.  IMPRESSION: No evidence of left lower extremity deep venous thrombosis.   Electronically Signed   By: Elon Alas   On: 03/07/2014 23:19   Dg Foot Complete Left  02/28/2014   CLINICAL DATA:  Persistent great toe pain status post trauma 2 days ago  EXAM: LEFT FOOT - COMPLETE 3+ VIEW  COMPARISON:  None.  FINDINGS: The bones are adequately mineralized. The interphalangeal and metatarsophalangeal joints are normal. There is minimal irregularity of the lateral aspect of the base of the distal phalanx of the great toe. This may be developmental but a subtle fracture is not excluded. The proximal phalanx is normal. The metatarsals and the tarsal bones are intact. There is soft tissue swelling over the forefoot.  IMPRESSION: There is significant soft tissue swelling over the dorsum of the forefoot. A non displaced fracture of the lateral aspect of the base of the distal phalanx is suspected.   Electronically Signed   By: David  Martin[place]   On: 02/28/2014 09:31       EKG Interpretation   Date/Time:  Monday March 07 2014 20:15:05 EDT Ventricular Rate:  129 PR Interval:  112 QRS Duration: 82 QT Interval:  306 QTC Calculation: 448 R Axis:   64 Text Interpretation:  Sinus  tachycardia Nonspecific ST and T wave  abnormality Abnormal ECG Since last tracing rate faster Confirmed by YAO   MD, DAVID ([id number]) on 03/07/2014 9:36:47 PM      MDM   Final diagnoses:  None   Weslyn M Haile is a 38 y.o. female here with SOB. Had L foot fracture recently and L leg slightly swollen. US showed no DVT. Patient tachycardic and d-dimer elevated but CT angio showed no PE. Afebrile and no signs of pneumonia. After 1L NS, tachycardia improved to 110-120 (baseline for her). I think likely asthma exacerbation and given IV solumedrol. Stable for d/c. Recommend prednisone, albuterol prn.    I personally performed the services described in this documentation, which was scribed in my presence. The recorded information has been reviewed and is accurate.   Wandra Arthurs, MD 03/07/14 2329  Wandra Arthurs, MD 03/07/14 [phone]

## 2014-03-07 NOTE — ED Notes (Signed)
Heart rate between 130-160. Pt states hx of same that has been evaluated. She has had a stress test and Holter monitor in the past.

## 2014-03-07 NOTE — ED Notes (Signed)
Cough and congestion for a couple of weeks. Used a breathing tx today with no relief.

## 2014-04-06 ENCOUNTER — Telehealth: Payer: Self-pay | Admitting: Family Medicine

## 2014-04-06 NOTE — Telephone Encounter (Signed)
Appointment scheduled with Elyn Aquas tomorrow, 04/07/14 @ 10 am.

## 2014-04-06 NOTE — Telephone Encounter (Signed)
Patient Information:  Caller Name: Nasiya  Phone: [phone]  Patient: Stacy Gentry, Stacy Gentry  Gender: Female  DOB: [birth year]  Age: 38 Years  PCP: Rosalita Chessman.  Pregnant: No  Office Follow Up:  Does the office need to follow up with this patient?: Yes  Instructions For The Office: Please call Neha at [phone] to schedule appointment on 04/07/14. No appointments  left in EPIC for 04/06/14. Twisha could not stay on hold for scheduler.   Symptoms  Reason For Call & Symptoms: Johari was seen in ED 2 weeks ago due to asthma, possible DVT after fracturing a toe and sinus drainage with post nasal drainage. DVT was ruled out. Was advised to use OTC decongestant for sinus drainage. Has yellow to clear nasal drainage especially in AM - onset 2 weeks ago. Ears popping intermittently and when she yawns. States sinus drainage has  not improved. Has intermittent heavy breathing. Has more shortness of breath at night. Using inhalers. Per asthma attack protocol has see today or tomorrow disposition due to nasal discharge > 10 days.  Per sinus pain and congestion protocol has see today or tomorrow in office due to nasal discharge present > 10 days. No appointments available on  04/06/2014.  Transferred to office to schedule appt. On hold for scheduler. Ellwood Handler states she cannot stay on hold. Dejanae requesting office call her regarding appointment at [phone]. States she is currently waiting at hospital while mother is in surgery. Please call Keisha at [phone].  Reviewed Health History In EMR: Yes  Reviewed Medications In EMR: Yes  Reviewed Allergies In EMR: Yes  Reviewed Surgeries / Procedures: Yes  Date of Onset of Symptoms: 03/23/2014 OB / GYN:  LMP: 03/19/2014  Guideline(s) Used:  Sinus Pain and Congestion  Asthma Attack  Disposition Per Guideline:   See Today or Tomorrow in Office  Reason For Disposition Reached:   Nasal discharge present > 10 days  Advice Given:  Expected Course:  Sinus congestion from viral upper respiratory infections (colds) usually lasts 5-10 days.  Hydration:  Drink plenty of liquids (6-8 glasses of water daily). If the air in your home is dry, use a cool mist humidifier  Pain and Fever Medicines:  For pain or fever relief, take either acetaminophen or ibuprofen.  For a Stuffy Nose - Use Nasal Washes:  Introduction: Saline (salt water) nasal irrigation (nasal wash) is an effective and simple home remedy for treating stuffy nose and sinus congestion. The nose can be irrigated by pouring, spraying, or squirting salt water into the nose and then letting it run back out.  How it Helps: The salt water rinses out excess mucus, washes out any irritants (dust, allergens) that might be present, and moistens the nasal cavity.  For a Stuffy Nose - Use Nasal Washes:  Introduction: Saline (salt water) nasal irrigation (nasal wash) is an effective and simple home remedy for treating stuffy nose and sinus congestion. The nose can be irrigated by pouring, spraying, or squirting salt water into the nose and then letting it run back out.  How it Helps: The salt water rinses out excess mucus, washes out any irritants (dust, allergens) that might be present, and moistens the nasal cavity.  Quick-Relief Asthma Medicine:   Start your quick-relief medicine (e.g., albuterol, salbutamol) at the first sign of any coughing or shortness of breath (don't wait for wheezing). Use your inhaler (2 puffs each time) or nebulizer every 4 hours. Continue the quick-relief medicine until you have not wheezed or  coughed for 48 hours.  Drinking Liquids:  Try to drink normal amount of liquids (e.g., water). Being adequately hydrated makes it easier to cough up the sticky lung mucus.  Humidifier:   If the air is dry, use a cool mist humidifier to prevent drying of the upper airway.  Patient Will Follow Care Advice:  YES

## 2014-04-07 ENCOUNTER — Encounter: Payer: Self-pay | Admitting: Physician Assistant

## 2014-04-07 ENCOUNTER — Ambulatory Visit (INDEPENDENT_AMBULATORY_CARE_PROVIDER_SITE_OTHER): Payer: BC Managed Care – PPO | Admitting: Physician Assistant

## 2014-04-07 VITALS — BP 106/78 | HR 115 | Temp 98.3°F | Resp 16 | Ht 64.0 in | Wt 235.0 lb

## 2014-04-07 DIAGNOSIS — J019 Acute sinusitis, unspecified: Secondary | ICD-10-CM

## 2014-04-07 MED ORDER — AZITHROMYCIN 250 MG PO TABS: ORAL_TABLET | ORAL | Status: DC

## 2014-04-07 MED ORDER — BENZONATATE 200 MG PO CAPS: 200.0000 mg | ORAL_CAPSULE | Freq: Two times a day (BID) | ORAL | Status: DC | PRN

## 2014-04-07 NOTE — Progress Notes (Signed)
Patient presents to clinic today c/o sinus pressure, ear pressure, "popping" of ears, PND and thick rhinorrhea x 2+ weeks.  Denies tooth pain.  Does endorse some ear pain.  Endorses seasonal allergies for which she takes a daily Zyrtec.  Denies recent travel or sick contact.  Denies SOB, wheezing or increased use of inhaler.  Endorses persistent cough secondary to drainage.   Past Medical History  Diagnosis Date  . Asthma   . Depression   . Syncope   . Migraines     Dr.Paul Hassell Done in [place]  . PCOS (polycystic ovarian syndrome)     S[facility] Bumpers    Current Outpatient Prescriptions on File Prior to Visit  Medication Sig Dispense Refill  . albuterol (VENTOLIN HFA) 108 (90 BASE) MCG/ACT inhaler Inhale 2 puffs into the lungs every 6 (six) hours as needed for wheezing.  1 Inhaler  1  . beclomethasone (QVAR) 40 MCG/ACT inhaler Inhale 2 puffs into the lungs 2 (two) times daily.  1 Inhaler  6  . cetirizine (ZYRTEC) 10 MG tablet Take 1 tablet (10 mg total) by mouth daily.  30 tablet  11  . cyclobenzaprine (FLEXERIL) 5 MG tablet Take 5 mg by mouth 3 (three) times daily as needed. For muscle spasms.      . diclofenac (VOLTAREN) 50 MG EC tablet Take 50 mg by mouth at bedtime.      . hydrocortisone (ANUSOL-HC) 2.5 % rectal cream Place rectally 2 (two) times daily.  30 g  1  . naratriptan (AMERGE) 2.5 MG tablet       . ondansetron (ZOFRAN ODT) 8 MG disintegrating tablet Take 1 tablet (8 mg total) by mouth every 8 (eight) hours as needed for nausea or vomiting.  20 tablet  0  . SUMAtriptan Succinate (IMITREX IJ) Inject 6 mg as directed as needed. For migraines.      . topiramate (TOPAMAX) 25 MG tablet Take 50 mg by mouth daily.        No current facility-administered medications on file prior to visit.    Allergies  Allergen Reactions  . Codeine Hives, Shortness Of Breath and Itching  . Flagyl [Metronidazole] Nausea Only  . Hydrocodone Itching  . Oxycodone Itching    Family History  Problem  Relation Age of Onset  . Breast cancer Mother 63  . Diabetes Mother   . Arthritis Mother   . Breast cancer  17    Maternal Cousin  . Diabetes Maternal Grandmother   . Arthritis Maternal Grandmother   . Diabetes Maternal Aunt   . Hyperlipidemia Paternal Aunt   . Hyperlipidemia Paternal Uncle   . Stroke Paternal Uncle   . Hypertension    . Kidney disease Paternal Uncle   . Depression    . Heart disease Paternal Uncle 56    Sudden death    History   Social History  . Marital Status: Single    Spouse Name: N/A    Number of Children: N/A  . Years of Education: N/A   Occupational History  . Clinical cytogeneticist for [employer] History Main Topics  . Smoking status: Never Smoker   . Smokeless tobacco: Never Used  . Alcohol Use: No  . Drug Use: No  . Sexual Activity: Not on file   Other Topics Concern  . Not on file   Social History Narrative   Lives alone.   Exercise--no   Review of Systems - See HPI.  All other ROS are  negative.  BP 106/78  Pulse 115  Temp(Src) 98.3 F (36.8 C) (Oral)  Resp 16  Ht '5\' 4"'  (1.626 m)  Wt 235 lb (106.595 kg)  BMI 40.32 kg/m2  SpO2 99%  LMP 03/19/2014  Physical Exam  Vitals reviewed. Constitutional: She is oriented to person, place, and time and well-developed, well-nourished, and in no distress.  HENT:  Head: Normocephalic and atraumatic.  Right Ear: External ear and ear canal normal. Tympanic membrane is retracted.  Left Ear: External ear and ear canal normal. Tympanic membrane is retracted.  Nose: Mucosal edema, rhinorrhea and sinus tenderness present. No septal deviation. Right sinus exhibits maxillary sinus tenderness. Right sinus exhibits no frontal sinus tenderness. Left sinus exhibits maxillary sinus tenderness. Left sinus exhibits no frontal sinus tenderness.  Mouth/Throat: Uvula is midline, oropharynx is clear and moist and mucous membranes are normal.  Eyes: Conjunctivae are normal.  Neck: Neck  supple.  Cardiovascular: Normal rate, regular rhythm, normal heart sounds and intact distal pulses.   Pulmonary/Chest: Effort normal and breath sounds normal. No respiratory distress. She has no wheezes. She has no rales. She exhibits no tenderness.  Lymphadenopathy:    She has no cervical adenopathy.  Neurological: She is alert and oriented to person, place, and time.  Skin: Skin is warm and dry. No rash noted.    Recent Results (from the past 2160 hour(s))  CBC WITH DIFFERENTIAL     Status: Abnormal   Collection Time    01/19/14  4:43 PM      Result Value Ref Range   WBC 11.3 (*) 4.0 - 10.5 K/uL   RBC 4.19  3.87 - 5.11 MIL/uL   Hemoglobin 12.0  12.0 - 15.0 g/dL   HCT 35.2 (*) 36.0 - 46.0 %   MCV 84.0  78.0 - 100.0 fL   MCH 28.6  26.0 - 34.0 pg   MCHC 34.1  30.0 - 36.0 g/dL   RDW 12.6  11.5 - 15.5 %   Platelets 358  150 - 400 K/uL   Neutrophils Relative % 79 (*) 43 - 77 %   Neutro Abs 8.9 (*) 1.7 - 7.7 K/uL   Lymphocytes Relative 11 (*) 12 - 46 %   Lymphs Abs 1.2  0.7 - 4.0 K/uL   Monocytes Relative 9  3 - 12 %   Monocytes Absolute 1.0  0.1 - 1.0 K/uL   Eosinophils Relative 1  0 - 5 %   Eosinophils Absolute 0.1  0.0 - 0.7 K/uL   Basophils Relative 0  0 - 1 %   Basophils Absolute 0.0  0.0 - 0.1 K/uL  COMPREHENSIVE METABOLIC PANEL     Status: Abnormal   Collection Time    01/19/14  4:43 PM      Result Value Ref Range   Sodium 138  137 - 147 mEq/L   Potassium 3.9  3.7 - 5.3 mEq/L   Chloride 103  96 - 112 mEq/L   CO2 20  19 - 32 mEq/L   Glucose, Bld 111 (*) 70 - 99 mg/dL   BUN 7  6 - 23 mg/dL   Creatinine, Ser 1.00  0.50 - 1.10 mg/dL   Calcium 9.2  8.4 - 10.5 mg/dL   Total Protein 8.0  6.0 - 8.3 g/dL   Albumin 4.1  3.5 - 5.2 g/dL   AST 16  0 - 37 U/L   ALT 15  0 - 35 U/L   Alkaline Phosphatase 74  39 - 117 U/L  Total Bilirubin 0.3  0.3 - 1.2 mg/dL   GFR calc non Af Amer 71 (*) >90 mL/min   GFR calc Af Amer 82 (*) >90 mL/min   Comment: (NOTE)     The eGFR has been  calculated using the CKD EPI equation.     This calculation has not been validated in all clinical situations.     eGFR's persistently <90 mL/min signify possible Chronic Kidney     Disease.  LIPASE, BLOOD     Status: None   Collection Time    01/19/14  4:43 PM      Result Value Ref Range   Lipase 15  11 - 59 U/L  URINALYSIS, ROUTINE W REFLEX MICROSCOPIC     Status: None   Collection Time    01/19/14  4:44 PM      Result Value Ref Range   Color, Urine YELLOW  YELLOW   APPearance CLEAR  CLEAR   Specific Gravity, Urine 1.007  1.005 - 1.030   pH 7.5  5.0 - 8.0   Glucose, UA NEGATIVE  NEGATIVE mg/dL   Hgb urine dipstick NEGATIVE  NEGATIVE   Bilirubin Urine NEGATIVE  NEGATIVE   Ketones, ur NEGATIVE  NEGATIVE mg/dL   Protein, ur NEGATIVE  NEGATIVE mg/dL   Urobilinogen, UA 0.2  0.0 - 1.0 mg/dL   Nitrite NEGATIVE  NEGATIVE   Leukocytes, UA NEGATIVE  NEGATIVE   Comment: MICROSCOPIC NOT DONE ON URINES WITH NEGATIVE PROTEIN, BLOOD, LEUKOCYTES, NITRITE, OR GLUCOSE <1000 mg/dL.  PREGNANCY, URINE     Status: None   Collection Time    01/19/14  4:44 PM      Result Value Ref Range   Preg Test, Ur NEGATIVE  NEGATIVE   Comment:            THE SENSITIVITY OF THIS     METHODOLOGY IS >20 mIU/mL.  CBC WITH DIFFERENTIAL     Status: Abnormal   Collection Time    03/07/14  9:39 PM      Result Value Ref Range   WBC 18.1 (*) 4.0 - 10.5 K/uL   RBC 4.46  3.87 - 5.11 MIL/uL   Hemoglobin 12.7  12.0 - 15.0 g/dL   HCT 37.2  36.0 - 46.0 %   MCV 83.4  78.0 - 100.0 fL   MCH 28.5  26.0 - 34.0 pg   MCHC 34.1  30.0 - 36.0 g/dL   RDW 13.5  11.5 - 15.5 %   Platelets 331  150 - 400 K/uL   Neutrophils Relative % 67  43 - 77 %   Neutro Abs 12.1 (*) 1.7 - 7.7 K/uL   Lymphocytes Relative 24  12 - 46 %   Lymphs Abs 4.4 (*) 0.7 - 4.0 K/uL   Monocytes Relative 8  3 - 12 %   Monocytes Absolute 1.4 (*) 0.1 - 1.0 K/uL   Eosinophils Relative 1  0 - 5 %   Eosinophils Absolute 0.1  0.0 - 0.7 K/uL   Basophils  Relative 0  0 - 1 %   Basophils Absolute 0.0  0.0 - 0.1 K/uL  COMPREHENSIVE METABOLIC PANEL     Status: Abnormal   Collection Time    03/07/14  9:39 PM      Result Value Ref Range   Sodium 137  137 - 147 mEq/L   Potassium 3.8  3.7 - 5.3 mEq/L   Chloride 101  96 - 112 mEq/L   CO2 19  19 -  32 mEq/L   Glucose, Bld 109 (*) 70 - 99 mg/dL   BUN 8  6 - 23 mg/dL   Creatinine, Ser 1.00  0.50 - 1.10 mg/dL   Calcium 9.4  8.4 - 10.5 mg/dL   Total Protein 8.3  6.0 - 8.3 g/dL   Albumin 4.2  3.5 - 5.2 g/dL   AST 22  0 - 37 U/L   Comment: HEMOLYZED SPECIMEN, RESULTS MAY BE AFFECTED   ALT 10  0 - 35 U/L   Alkaline Phosphatase 64  39 - 117 U/L   Total Bilirubin 0.3  0.3 - 1.2 mg/dL   GFR calc non Af Amer 71 (*) >90 mL/min   GFR calc Af Amer 82 (*) >90 mL/min   Comment: (NOTE)     The eGFR has been calculated using the CKD EPI equation.     This calculation has not been validated in all clinical situations.     eGFR's persistently <90 mL/min signify possible Chronic Kidney     Disease.  D-DIMER, QUANTITATIVE     Status: Abnormal   Collection Time    03/07/14  9:41 PM      Result Value Ref Range   D-Dimer, Quant 0.66 (*) 0.00 - 0.48 ug/mL-FEU   Comment:            AT THE INHOUSE ESTABLISHED CUTOFF     VALUE OF 0.48 ug/mL FEU,     THIS ASSAY HAS BEEN DOCUMENTED     IN THE LITERATURE TO HAVE     A SENSITIVITY AND NEGATIVE     PREDICTIVE VALUE OF AT LEAST     98 TO 99%.  THE TEST RESULT     SHOULD BE CORRELATED WITH     AN ASSESSMENT OF THE CLINICAL     PROBABILITY OF DVT / VTE.  TROPONIN I     Status: None   Collection Time    03/07/14  9:41 PM      Result Value Ref Range   Troponin I <0.30  <0.30 ng/mL   Comment:            Due to the release kinetics of cTnI,     a negative result within the first hours     of the onset of symptoms does not rule out     myocardial infarction with certainty.     If myocardial infarction is still suspected,     repeat the test at appropriate  intervals.   Assessment/Plan: Acute sinusitis with symptoms > 10 days Rx Azithromycin.  Rx Tessalon perles.  Increase fluid intake.  Continue Zyrtec. Try Nasocort.  Humidifier in bedroom.  Saline nasal spray.  Return precautions discussed with patient.

## 2014-04-07 NOTE — Progress Notes (Signed)
Pre visit review using our clinic review tool, if applicable. No additional management support is needed unless otherwise documented below in the visit note/SLS  

## 2014-04-07 NOTE — Assessment & Plan Note (Signed)
Rx Azithromycin.  Rx Tessalon perles.  Increase fluid intake.  Continue Zyrtec. Try Nasocort.  Humidifier in bedroom.  Saline nasal spray.  Return precautions discussed with patient.

## 2014-04-07 NOTE — Patient Instructions (Signed)
Please take antibiotic as directed.  Increase fluid intake.  Use Saline nasal spray.  Take a daily multivitamin. Use Tessalon for cough.  Place a humidifier in the bedroom.  Switch from Flonase to Nasocort daily.  You can get this over-the-counter. Please call or return clinic if symptoms are not improving.  Sinusitis Sinusitis is redness, soreness, and swelling (inflammation) of the paranasal sinuses. Paranasal sinuses are air pockets within the bones of your face (beneath the eyes, the middle of the forehead, or above the eyes). In healthy paranasal sinuses, mucus is able to drain out, and air is able to circulate through them by way of your nose. However, when your paranasal sinuses are inflamed, mucus and air can become trapped. This can allow bacteria and other germs to grow and cause infection. Sinusitis can develop quickly and last only a short time (acute) or continue over a long period (chronic). Sinusitis that lasts for more than 12 weeks is considered chronic.  CAUSES  Causes of sinusitis include:  Allergies.  Structural abnormalities, such as displacement of the cartilage that separates your nostrils (deviated septum), which can decrease the air flow through your nose and sinuses and affect sinus drainage.  Functional abnormalities, such as when the small hairs (cilia) that line your sinuses and help remove mucus do not work properly or are not present. SYMPTOMS  Symptoms of acute and chronic sinusitis are the same. The primary symptoms are pain and pressure around the affected sinuses. Other symptoms include:  Upper toothache.  Earache.  Headache.  Bad breath.  Decreased sense of smell and taste.  A cough, which worsens when you are lying flat.  Fatigue.  Fever.  Thick drainage from your nose, which often is green and may contain pus (purulent).  Swelling and warmth over the affected sinuses. DIAGNOSIS  Your caregiver will perform a physical exam. During the exam,  your caregiver may:  Look in your nose for signs of abnormal growths in your nostrils (nasal polyps).  Tap over the affected sinus to check for signs of infection.  View the inside of your sinuses (endoscopy) with a special imaging device with a light attached (endoscope), which is inserted into your sinuses. If your caregiver suspects that you have chronic sinusitis, one or more of the following tests may be recommended:  Allergy tests.  Nasal culture A sample of mucus is taken from your nose and sent to a lab and screened for bacteria.  Nasal cytology A sample of mucus is taken from your nose and examined by your caregiver to determine if your sinusitis is related to an allergy. TREATMENT  Most cases of acute sinusitis are related to a viral infection and will resolve on their own within 10 days. Sometimes medicines are prescribed to help relieve symptoms (pain medicine, decongestants, nasal steroid sprays, or saline sprays).  However, for sinusitis related to a bacterial infection, your caregiver will prescribe antibiotic medicines. These are medicines that will help kill the bacteria causing the infection.  Rarely, sinusitis is caused by a fungal infection. In theses cases, your caregiver will prescribe antifungal medicine. For some cases of chronic sinusitis, surgery is needed. Generally, these are cases in which sinusitis recurs more than 3 times per year, despite other treatments. HOME CARE INSTRUCTIONS   Drink plenty of water. Water helps thin the mucus so your sinuses can drain more easily.  Use a humidifier.  Inhale steam 3 to 4 times a day (for example, sit in the bathroom with the shower  running).  Apply a warm, moist washcloth to your face 3 to 4 times a day, or as directed by your caregiver.  Use saline nasal sprays to help moisten and clean your sinuses.  Take over-the-counter or prescription medicines for pain, discomfort, or fever only as directed by your  caregiver. SEEK IMMEDIATE MEDICAL CARE IF:  You have increasing pain or severe headaches.  You have nausea, vomiting, or drowsiness.  You have swelling around your face.  You have vision problems.  You have a stiff neck.  You have difficulty breathing. MAKE SURE YOU:   Understand these instructions.  Will watch your condition.  Will get help right away if you are not doing well or get worse. Document Released: 09/02/2005 Document Revised: 11/25/2011 Document Reviewed: 09/17/2011 [employer] [facility] Patient Information 2014 [place], [place].

## 2014-05-19 ENCOUNTER — Ambulatory Visit (INDEPENDENT_AMBULATORY_CARE_PROVIDER_SITE_OTHER): Payer: BC Managed Care – PPO | Admitting: Family Medicine

## 2014-05-19 ENCOUNTER — Encounter: Payer: Self-pay | Admitting: Family Medicine

## 2014-05-19 VITALS — BP 124/74 | HR 130 | Temp 99.5°F | Wt 238.5 lb

## 2014-05-19 DIAGNOSIS — M25511 Pain in right shoulder: Secondary | ICD-10-CM

## 2014-05-19 DIAGNOSIS — H6500 Acute serous otitis media, unspecified ear: Secondary | ICD-10-CM

## 2014-05-19 DIAGNOSIS — H6504 Acute serous otitis media, recurrent, right ear: Secondary | ICD-10-CM

## 2014-05-19 DIAGNOSIS — M25519 Pain in unspecified shoulder: Secondary | ICD-10-CM

## 2014-05-19 MED ORDER — CYCLOBENZAPRINE HCL 10 MG PO TABS: 10.0000 mg | ORAL_TABLET | Freq: Three times a day (TID) | ORAL | Status: DC | PRN

## 2014-05-19 MED ORDER — MELOXICAM 15 MG PO TABS: 15.0000 mg | ORAL_TABLET | Freq: Every day | ORAL | Status: DC

## 2014-05-19 NOTE — Patient Instructions (Signed)
Serous Otitis Media °Serous otitis media is fluid in the middle ear space. This space contains the bones for hearing and air. Air in the middle ear space helps to transmit sound.  °The air gets there through the eustachian tube. This tube goes from the back of the nose (nasopharynx) to the middle ear space. It keeps the pressure in the middle ear the same as the outside world. It also helps to drain fluid from the middle ear space. °CAUSES  °Serous otitis media occurs when the eustachian tube gets blocked. Blockage can come from: °· Ear infections. °· Colds and other upper respiratory infections. °· Allergies. °· Irritants such as cigarette smoke. °· Sudden changes in air pressure (such as descending in an airplane). °· Enlarged adenoids. °· A mass in the nasopharynx. °During colds and upper respiratory infections, the middle ear space can become temporarily filled with fluid. This can happen after an ear infection also. Once the infection clears, the fluid will generally drain out of the ear through the eustachian tube. If it does not, then serous otitis media occurs. °SIGNS AND SYMPTOMS  °· Hearing loss. °· A feeling of fullness in the ear, without pain. °· Young children may not show any symptoms but may show slight behavioral changes, such as agitation, ear pulling, or crying. °DIAGNOSIS  °Serous otitis media is diagnosed by an ear exam. Tests may be done to check on the movement of the eardrum. Hearing exams may also be done. °TREATMENT  °The fluid most often goes away without treatment. If allergy is the cause, allergy treatment may be helpful. Fluid that persists for several months may require minor surgery. A small tube is placed in the eardrum to: °· Drain the fluid. °· Restore the air in the middle ear space. °In certain situations, antibiotic medicines are used to avoid surgery. Surgery may be done to remove enlarged adenoids (if this is the cause). °HOME CARE INSTRUCTIONS  °· Keep children away from  tobacco smoke. °· Keep all follow-up visits as directed by your health care provider. °SEEK MEDICAL CARE IF:  °· Your hearing is not better in 3 months. °· Your hearing is worse. °· You have ear pain. °· You have drainage from the ear. °· You have dizziness. °· You have serous otitis media only in one ear or have any bleeding from your nose (epistaxis). °· You notice a lump on your neck. °MAKE SURE YOU: °· Understand these instructions.   °· Will watch your condition.   °· Will get help right away if you are not doing well or get worse.   °Document Released: 11/23/2003 Document Revised: 01/17/2014 Document Reviewed: 03/30/2013 °[employer]® Patient Information ©2015 [employer]. This information is not intended to replace advice given to you by your health care provider. Make sure you discuss any questions you have with your health care provider. ° °

## 2014-05-19 NOTE — Progress Notes (Signed)
Pre visit review using our clinic review tool, if applicable. No additional management support is needed unless otherwise documented below in the visit note. 

## 2014-05-19 NOTE — Progress Notes (Signed)
   Subjective:    Patient ID: Stacy Gentry, female    DOB: [birth year], 38 y.o.   MRN: [id number]  HPI Pt is here c/o R ear feeling full, no pain.  She also c/o R shoulder pain-- she thought she slept on it wrong but it has not improved in last few weeks.  No known injury.     Review of Systems As above    Objective:   Physical Exam BP 124/74  Pulse 130  Temp(Src) 99.5 F (37.5 C) (Oral)  Wt 238 lb 8.6 oz (108.2 kg)  SpO2 98% General appearance: alert, cooperative, appears stated age and no distress Throat: lips, mucosa, and tongue normal; teeth and gums normal EAR-- + fluid R ear, no errythema Neck: no adenopathy, no carotid bruit, no JVD, supple, symmetrical, trachea midline and thyroid not enlarged, symmetric, no tenderness/mass/nodules Lungs: clear to auscultation bilaterally Extremities: extremities normal, atraumatic, no cyanosis or edema       Assessment & Plan:  1. Pain in joint, shoulder region, right Warm compresses, massage--rto prn  - meloxicam (MOBIC) 15 MG tablet; Take 1 tablet (15 mg total) by mouth daily.  Dispense: 30 tablet; Refill: 0 - cyclobenzaprine (FLEXERIL) 10 MG tablet; Take 1 tablet (10 mg total) by mouth 3 (three) times daily as needed for muscle spasms.  Dispense: 30 tablet; Refill: 0  2. Recurrent acute serous otitis media of right ear Use nasacort and antihistamine

## 2014-05-25 ENCOUNTER — Telehealth: Payer: Self-pay

## 2014-05-25 MED ORDER — AZITHROMYCIN 250 MG PO TABS: ORAL_TABLET | ORAL | Status: DC

## 2014-05-25 NOTE — Telephone Encounter (Signed)
Continue nasal spray but should also add Claritin D OR sudafed (in addition to the Zyrtec on her med list) to decrease pressure and congestion in her ears.

## 2014-05-25 NOTE — Telephone Encounter (Signed)
Medication filled and pt notified.  

## 2014-05-25 NOTE — Telephone Encounter (Signed)
Detailed message left with the below instructions for the patient.      KP

## 2014-05-25 NOTE — Telephone Encounter (Signed)
Pt was offered an appt but stated that she could not come in due to work schedule.  [place] for [employer].  If no better after meds, will need OV for f/u.

## 2014-05-25 NOTE — Telephone Encounter (Signed)
Patient was diagnosed with the below Dx on 05/19/14. She said she was not give any med's but her ear is still hurting and feels like there is water in them, and she can not hear. She had tried Chlor-trimeton as well as the nasal spray with no relief, she wanted to know if she could get something for the discomfort or if she needs an antibiotic.  Please advise in Dr.Lowne absence.  2. Recurrent acute serous otitis media of right ear Use nasacort and antihistamine

## 2014-05-25 NOTE — Telephone Encounter (Signed)
The patient had previously tried the sudafed with no relief and that's when she switched to the chlor-trimeton and she said both ears are now popping and not just the right one.   Please advise      KP

## 2014-06-01 ENCOUNTER — Ambulatory Visit: Payer: BC Managed Care – PPO | Admitting: Physician Assistant

## 2014-06-01 ENCOUNTER — Telehealth: Payer: Self-pay | Admitting: Family Medicine

## 2014-06-01 DIAGNOSIS — Z0289 Encounter for other administrative examinations: Secondary | ICD-10-CM

## 2014-06-01 NOTE — Telephone Encounter (Signed)
error 

## 2014-06-02 ENCOUNTER — Encounter: Payer: Self-pay | Admitting: Family Medicine

## 2014-06-02 ENCOUNTER — Ambulatory Visit (INDEPENDENT_AMBULATORY_CARE_PROVIDER_SITE_OTHER): Payer: BC Managed Care – PPO | Admitting: Family Medicine

## 2014-06-02 VITALS — BP 128/83 | HR 139 | Temp 98.4°F

## 2014-06-02 DIAGNOSIS — H6500 Acute serous otitis media, unspecified ear: Secondary | ICD-10-CM

## 2014-06-02 DIAGNOSIS — H6503 Acute serous otitis media, bilateral: Secondary | ICD-10-CM

## 2014-06-02 MED ORDER — CEFUROXIME AXETIL 500 MG PO TABS: 500.0000 mg | ORAL_TABLET | Freq: Two times a day (BID) | ORAL | Status: AC

## 2014-06-02 MED ORDER — PREDNISONE 10 MG PO TABS: ORAL_TABLET | ORAL | Status: DC

## 2014-06-02 NOTE — Patient Instructions (Signed)

## 2014-06-02 NOTE — Progress Notes (Signed)
  Subjective:     Stacy Gentry is a 38 y.o. female who presents with ear pain and possible ear infection. Symptoms include: bilateral ear pain, congestion and plugged sensation in both ears. Onset of symptoms was a few weeks ago, and have been gradually worsening since that time. Associated symptoms include: post nasal drip.  Patient denies: achiness, chills, fever , headache, low grade fever, productive cough, sinus pressure, sneezing and sore throat. She is drinking plenty of fluids.  The following portions of the patient's history were reviewed and updated as appropriate: allergies, current medications, past family history, past medical history, past social history, past surgical history and problem list.  Review of Systems Pertinent items are noted in HPI.   Objective:    BP 128/83  Pulse 139  Temp(Src) 98.4 F (36.9 C) (Oral)  SpO2 100% General:  alert, cooperative, appears stated age and no distress  Right Ear: + fluid, tm dull  Left Ear: + fluid, dull  Mouth:  abnormal findings: mild oropharyngeal erythema and pnd  Neck: no adenopathy and thyroid not enlarged, symmetric, no tenderness/mass/nodules     Assessment:    Bilateral acute otitis media   Plan:    Treatment: ceftin. OTC analgesia as needed. Fluids, rest, avoid carbonated/alcoholic and caffeinated beverages.  Follow up in a few weeks if not improving. ---or sooner pred taper To ENT if no relief

## 2014-06-02 NOTE — Progress Notes (Signed)
Pre visit review using our clinic review tool, if applicable. No additional management support is needed unless otherwise documented below in the visit note. 

## 2014-06-08 ENCOUNTER — Telehealth: Payer: Self-pay

## 2014-06-08 NOTE — Telephone Encounter (Signed)
Call from the patient and she stated she mentioned to [facility] during her pervious visit the the lorazepam helps when she is anxious but she never got the Rx called in and wanted to know if we could send it to the pharmacy. It was prescribed during her last Hospital visit, please advise in Dr.Lowne's absence      KP

## 2014-06-08 NOTE — Telephone Encounter (Signed)
Will defer request to Dr Etter Sjogren as it has not been filled at this office.

## 2014-06-09 ENCOUNTER — Other Ambulatory Visit: Payer: Self-pay

## 2014-06-09 ENCOUNTER — Encounter: Payer: Self-pay | Admitting: Family Medicine

## 2014-06-09 DIAGNOSIS — J329 Chronic sinusitis, unspecified: Secondary | ICD-10-CM

## 2014-06-09 DIAGNOSIS — H6593 Unspecified nonsuppurative otitis media, bilateral: Secondary | ICD-10-CM

## 2014-06-09 MED ORDER — LORAZEPAM 1 MG PO TABS: 1.0000 mg | ORAL_TABLET | Freq: Every evening | ORAL | Status: DC | PRN

## 2014-06-09 NOTE — Telephone Encounter (Signed)
Rx faxed and patient aware.     KP

## 2014-06-09 NOTE — Telephone Encounter (Signed)
Ok to refer to ent for ear pain and dizziness

## 2014-06-09 NOTE — Telephone Encounter (Signed)
Ativan 1 mg #30  1 po qhs prn

## 2014-06-09 NOTE — Telephone Encounter (Signed)
It has already been done and the patient has her apt already.      KP

## 2014-06-16 ENCOUNTER — Other Ambulatory Visit: Payer: Self-pay | Admitting: Family Medicine

## 2014-06-16 DIAGNOSIS — H6503 Acute serous otitis media, bilateral: Secondary | ICD-10-CM

## 2014-06-20 ENCOUNTER — Other Ambulatory Visit: Payer: Self-pay

## 2014-06-20 DIAGNOSIS — M25511 Pain in right shoulder: Secondary | ICD-10-CM

## 2014-06-20 MED ORDER — MELOXICAM 15 MG PO TABS: 15.0000 mg | ORAL_TABLET | Freq: Every day | ORAL | Status: DC

## 2014-07-29 ENCOUNTER — Other Ambulatory Visit: Payer: Self-pay | Admitting: Orthopedic Surgery

## 2014-07-29 DIAGNOSIS — M542 Cervicalgia: Secondary | ICD-10-CM

## 2014-07-31 ENCOUNTER — Ambulatory Visit
Admission: RE | Admit: 2014-07-31 | Discharge: 2014-07-31 | Disposition: A | Discharge status: Home / Self Care | Payer: BC Managed Care – PPO | Source: Ambulatory Visit | Attending: Orthopedic Surgery | Admitting: Orthopedic Surgery

## 2014-07-31 DIAGNOSIS — M542 Cervicalgia: Secondary | ICD-10-CM

## 2014-08-17 ENCOUNTER — Emergency Department (HOSPITAL_COMMUNITY): Payer: BC Managed Care – PPO

## 2014-08-17 ENCOUNTER — Emergency Department (HOSPITAL_COMMUNITY)
Admission: EM | Admit: 2014-08-17 | Discharge: 2014-08-17 | Disposition: A | Discharge status: Home / Self Care | Payer: BC Managed Care – PPO | Attending: Emergency Medicine | Admitting: Emergency Medicine

## 2014-08-17 ENCOUNTER — Encounter (HOSPITAL_COMMUNITY): Payer: Self-pay

## 2014-08-17 DIAGNOSIS — R079 Chest pain, unspecified: Secondary | ICD-10-CM | POA: Diagnosis present

## 2014-08-17 DIAGNOSIS — Z791 Long term (current) use of non-steroidal anti-inflammatories (NSAID): Secondary | ICD-10-CM | POA: Diagnosis not present

## 2014-08-17 DIAGNOSIS — G43909 Migraine, unspecified, not intractable, without status migrainosus: Secondary | ICD-10-CM | POA: Diagnosis not present

## 2014-08-17 DIAGNOSIS — E669 Obesity, unspecified: Secondary | ICD-10-CM | POA: Diagnosis not present

## 2014-08-17 DIAGNOSIS — F410 Panic disorder [episodic paroxysmal anxiety] without agoraphobia: Secondary | ICD-10-CM | POA: Diagnosis not present

## 2014-08-17 DIAGNOSIS — J45909 Unspecified asthma, uncomplicated: Secondary | ICD-10-CM | POA: Insufficient documentation

## 2014-08-17 DIAGNOSIS — Z79899 Other long term (current) drug therapy: Secondary | ICD-10-CM | POA: Insufficient documentation

## 2014-08-17 DIAGNOSIS — F329 Major depressive disorder, single episode, unspecified: Secondary | ICD-10-CM | POA: Insufficient documentation

## 2014-08-17 DIAGNOSIS — R1032 Left lower quadrant pain: Secondary | ICD-10-CM | POA: Diagnosis not present

## 2014-08-17 DIAGNOSIS — Z3202 Encounter for pregnancy test, result negative: Secondary | ICD-10-CM | POA: Diagnosis not present

## 2014-08-17 DIAGNOSIS — Z7952 Long term (current) use of systemic steroids: Secondary | ICD-10-CM | POA: Insufficient documentation

## 2014-08-17 DIAGNOSIS — R000 Tachycardia, unspecified: Secondary | ICD-10-CM | POA: Insufficient documentation

## 2014-08-17 HISTORY — DX: Anxiety disorder, unspecified: F41.9

## 2014-08-17 LAB — BASIC METABOLIC PANEL
Anion gap: 17 — ABNORMAL HIGH (ref 5–15)
BUN: 10 mg/dL (ref 6–23)
CALCIUM: 9.3 mg/dL (ref 8.4–10.5)
CO2: 23 mEq/L (ref 19–32)
CREATININE: 0.87 mg/dL (ref 0.50–1.10)
Chloride: 99 mEq/L (ref 96–112)
GFR, EST NON AFRICAN AMERICAN: 83 mL/min — AB (ref >90)
Glucose, Bld: 155 mg/dL — ABNORMAL HIGH (ref 70–99)
POTASSIUM: 3.5 meq/L — AB (ref 3.7–5.3)
Sodium: 139 mEq/L (ref 137–147)

## 2014-08-17 LAB — URINALYSIS, ROUTINE W REFLEX MICROSCOPIC
Bilirubin Urine: NEGATIVE
GLUCOSE, UA: NEGATIVE mg/dL
Hgb urine dipstick: NEGATIVE
Ketones, ur: NEGATIVE mg/dL
LEUKOCYTES UA: NEGATIVE
Nitrite: NEGATIVE
Protein, ur: NEGATIVE mg/dL
SPECIFIC GRAVITY, URINE: 1.038 — AB (ref 1.005–1.030)
Urobilinogen, UA: 0.2 mg/dL (ref 0.0–1.0)
pH: 6.5 (ref 5.0–8.0)

## 2014-08-17 LAB — D-DIMER, QUANTITATIVE: D-Dimer, Quant: 0.79 ug/mL-FEU — ABNORMAL HIGH (ref 0.00–0.48)

## 2014-08-17 LAB — CBC
HCT: 37.7 % (ref 36.0–46.0)
Hemoglobin: 12.5 g/dL (ref 12.0–15.0)
MCH: 27.5 pg (ref 26.0–34.0)
MCHC: 33.2 g/dL (ref 30.0–36.0)
MCV: 82.9 fL (ref 78.0–100.0)
Platelets: 337 10*3/uL (ref 150–400)
RBC: 4.55 MIL/uL (ref 3.87–5.11)
RDW: 13.2 % (ref 11.5–15.5)
WBC: 16.6 10*3/uL — ABNORMAL HIGH (ref 4.0–10.5)

## 2014-08-17 LAB — I-STAT TROPONIN, ED
TROPONIN I, POC: 0 ng/mL (ref 0.00–0.08)
Troponin i, poc: 0 ng/mL (ref 0.00–0.08)

## 2014-08-17 LAB — MAGNESIUM: Magnesium: 1.8 mg/dL (ref 1.5–2.5)

## 2014-08-17 LAB — POC URINE PREG, ED: PREG TEST UR: NEGATIVE

## 2014-08-17 MED ORDER — SODIUM CHLORIDE 0.9 % IV BOLUS (SEPSIS)
1000.0000 mL | Freq: Once | INTRAVENOUS | Status: AC
  Administered 2014-08-17: 1000 mL via INTRAVENOUS

## 2014-08-17 MED ORDER — MORPHINE SULFATE 4 MG/ML IJ SOLN
4.0000 mg | Freq: Once | INTRAMUSCULAR | Status: AC
  Administered 2014-08-17: 4 mg via INTRAVENOUS
  Filled 2014-08-17: qty 1

## 2014-08-17 MED ORDER — IOHEXOL 350 MG/ML SOLN
80.0000 mL | Freq: Once | INTRAVENOUS | Status: AC | PRN
  Administered 2014-08-17: 80 mL via INTRAVENOUS

## 2014-08-17 MED ORDER — DIPHENHYDRAMINE HCL 50 MG/ML IJ SOLN
25.0000 mg | Freq: Once | INTRAMUSCULAR | Status: AC
  Administered 2014-08-17: 25 mg via INTRAVENOUS
  Filled 2014-08-17: qty 1

## 2014-08-17 MED ORDER — GI COCKTAIL ~~LOC~~
30.0000 mL | Freq: Once | ORAL | Status: AC
  Administered 2014-08-17: 30 mL via ORAL
  Filled 2014-08-17: qty 30

## 2014-08-17 MED ORDER — ASPIRIN 325 MG PO TABS: 325.0000 mg | ORAL_TABLET | Freq: Once | ORAL | Status: DC

## 2014-08-17 NOTE — ED Notes (Addendum)
Per [facility], pt from work for left sided cp and anxiety. ST on monitor at 110 BP 110/70. Per EMS, pt had anxiety attack in truck and started pulling everything off. Had her on oxygen and she pulled it off. 100 % on and off of oxygen. Was given 324 mg ASA by EMS

## 2014-08-17 NOTE — ED Provider Notes (Signed)
Patient care acquired from [facility], PA-C pending repeat troponin with plan to discharge home if negative.   Results for orders placed or performed during the hospital encounter of 08/17/14  CBC  Result Value Ref Range   WBC 16.6 (H) 4.0 - 10.5 K/uL   RBC 4.55 3.87 - 5.11 MIL/uL   Hemoglobin 12.5 12.0 - 15.0 g/dL   HCT 37.7 36.0 - 46.0 %   MCV 82.9 78.0 - 100.0 fL   MCH 27.5 26.0 - 34.0 pg   MCHC 33.2 30.0 - 36.0 g/dL   RDW 13.2 11.5 - 15.5 %   Platelets 337 150 - 400 K/uL  Basic metabolic panel  Result Value Ref Range   Sodium 139 137 - 147 mEq/L   Potassium 3.5 (L) 3.7 - 5.3 mEq/L   Chloride 99 96 - 112 mEq/L   CO2 23 19 - 32 mEq/L   Glucose, Bld 155 (H) 70 - 99 mg/dL   BUN 10 6 - 23 mg/dL   Creatinine, Ser 0.87 0.50 - 1.10 mg/dL   Calcium 9.3 8.4 - 10.5 mg/dL   GFR calc non Af Amer 83 (L) >90 mL/min   GFR calc Af Amer >90 >90 mL/min   Anion gap 17 (H) 5 - 15  D-dimer, quantitative  Result Value Ref Range   D-Dimer, Quant 0.79 (H) 0.00 - 0.48 ug/mL-FEU  Magnesium  Result Value Ref Range   Magnesium 1.8 1.5 - 2.5 mg/dL  Urinalysis, Routine w reflex microscopic  Result Value Ref Range   Color, Urine YELLOW YELLOW   APPearance CLEAR CLEAR   Specific Gravity, Urine 1.038 (H) 1.005 - 1.030   pH 6.5 5.0 - 8.0   Glucose, UA NEGATIVE NEGATIVE mg/dL   Hgb urine dipstick NEGATIVE NEGATIVE   Bilirubin Urine NEGATIVE NEGATIVE   Ketones, ur NEGATIVE NEGATIVE mg/dL   Protein, ur NEGATIVE NEGATIVE mg/dL   Urobilinogen, UA 0.2 0.0 - 1.0 mg/dL   Nitrite NEGATIVE NEGATIVE   Leukocytes, UA NEGATIVE NEGATIVE  I-stat troponin, ED (not at [facility])  Result Value Ref Range   Troponin i, poc 0.00 0.00 - 0.08 ng/mL   Comment 3          POC urine preg, ED (not at [facility])  Result Value Ref Range   Preg Test, Ur NEGATIVE NEGATIVE   Dg Chest 2 View  08/17/2014   CLINICAL DATA:  Chest pain radiating to the left.  EXAM: CHEST  2 VIEW  COMPARISON:  CT chest of PA and lateral chest  03/07/2014.  FINDINGS: Heart size and mediastinal contours are within normal limits. Both lungs are clear. Visualized skeletal structures are unremarkable.  IMPRESSION: Normal exam.   Electronically Signed   By: Inge Rise M.D.   On: 08/17/2014 15:37   Ct Angio Chest Pe W/cm &/or Wo Cm  08/17/2014   CLINICAL DATA:  Acute chest pain.  Elevated D-dimer.  EXAM: CT ANGIOGRAPHY CHEST WITH CONTRAST  TECHNIQUE: Multidetector CT imaging of the chest was performed using the standard protocol during bolus administration of intravenous contrast. Multiplanar CT image reconstructions and MIPs were obtained to evaluate the vascular anatomy.  CONTRAST:  13mL OMNIPAQUE IOHEXOL 350 MG/ML SOLN  COMPARISON:  03/07/2014  FINDINGS: Chest wall: No breast masses, supraclavicular or axillary lymphadenopathy. The thyroid gland is grossly normal. The bony thorax is intact. No bone lesions or spinal canal compromise.  Mediastinal: The heart is normal in size. No pericardial effusion. No mediastinal or hilar mass or adenopathy. The aorta is  normal. The branch vessels are normal. The esophagus is grossly normal.  The pulmonary arterial tree is fairly well opacified. No definite filling defects to suggest pulmonary emboli.  Lungs: No acute pulmonary findings. Minimal dependent subpleural atelectasis. No pleural effusion, pulmonary edema or pneumothorax. No bronchiectasis or interstitial lung disease.  Upper abdomen: Diffuse fatty infiltration of the liver.  Review of the MIP images confirms the above findings.  IMPRESSION: No CT findings for pulmonary embolism.  Normal thoracic aorta.  No acute pulmonary findings.   Electronically Signed   By: Kalman Jewels M.D.   On: 08/17/2014 17:13   Mr Cervical Spine Wo Contrast  08/01/2014   CLINICAL DATA:  Neck pain radiating to RIGHT arm and shoulder, burning sensation, symptoms began 2 years ago.  EXAM: MRI CERVICAL SPINE WITHOUT CONTRAST  TECHNIQUE: Multiplanar, multisequence MR imaging  of the cervical spine was performed. No intravenous contrast was administered.  COMPARISON:  MRI of the cervical spine July 03, 2012  FINDINGS: Cervical vertebral bodies and posterior elements are intact and aligned with straightened cervical lordosis. Intervertebral disc morphology is generally preserved with decreased T2 signal within the cervical disc consistent with mild desiccation. Similar moderate chronic discogenic endplate changes G3-8 and C6-7, mild at C5-6, no STIR signal abnormality to suggest acute osseous process.  Ventral spinal cord deformity as described below, cervical spinal cord appears normal in signal characteristics prominent syrinx. Craniocervical junction is intact. Included prevertebral and paraspinal soft tissues are nonsuspicious.  Level by level evaluation (please note, patient was repositioned after sagittal series, the axials coregister to a localizer):  C2-3: No disc bulge, canal stenosis nor neural foraminal narrowing.  C3-4: Tiny central disc protrusion without canal stenosis or neural foraminal narrowing.  C4-5: 3 mm broad-based disc bulge, minimal uncovertebral hypertrophy. Similar mild canal stenosis. Mild to moderate RIGHT greater than LEFT neural foraminal narrowing.  C5-6: Tiny central disc protrusion. Minimal canal stenosis without neural foraminal narrowing.  C6-7: 3 mm broad-based disc bulge, mild canal stenosis without neural foraminal narrowing.  C7-T1 2 mm broad-based disc bulge without canal stenosis or neural foraminal narrowing.  T1-T2: 4 mm central disc extrusion with 11 mm of contiguous superior inferior extent, slightly progressed from prior examination. Mild canal stenosis without neural foraminal narrowing.  IMPRESSION: Straightened cervical lordosis without acute osseous process. No malalignment.  Multilevel cervical disc bulges/ protrusions resulting in mild canal stenosis at C4-5 and C6-7, minimal at C5-6. Mild to moderate C4-5 neural foraminal narrowing.   Small T1-2 central disc extrusion resulting in mild canal stenosis.   Electronically Signed   By: Elon Alas   On: 08/01/2014 03:33    Medications  aspirin tablet 325 mg (325 mg Oral Not Given 08/17/14 1511)  sodium chloride 0.9 % bolus 1,000 mL (1,000 mLs Intravenous New Bag/Given 08/17/14 1552)  gi cocktail (Maalox,Lidocaine,Donnatal) (30 mLs Oral Given 08/17/14 1552)  morphine 4 MG/ML injection 4 mg (4 mg Intravenous Given 08/17/14 1552)  diphenhydrAMINE (BENADRYL) injection 25 mg (25 mg Intravenous Given 08/17/14 1552)  iohexol (OMNIPAQUE) 350 MG/ML injection 80 mL (80 mLs Intravenous Contrast Given 08/17/14 1642)   1. Chest pain    Filed Vitals:   08/17/14 1845  BP: 109/75  Pulse: 113  Temp:   Resp: 22   Patient comfortable prior to discharge. Patient is stable at time of discharge   Harlow Mares, PA-C 08/17/14 [place], DO 08/18/14 0111

## 2014-08-17 NOTE — Discharge Instructions (Signed)
Your second troponin was negative. Please follow up with your primary care physician in 1-2 days. If you do not have one please call the [place] number listed above. Please read all discharge instructions and return precautions.    Chest Pain (Nonspecific) It is often hard to give a specific diagnosis for the cause of chest pain. There is always a chance that your pain could be related to something serious, such as a heart attack or a blood clot in the lungs. You need to follow up with your health care provider for further evaluation. CAUSES   Heartburn.  Pneumonia or bronchitis.  Anxiety or stress.  Inflammation around your heart (pericarditis) or lung (pleuritis or pleurisy).  A blood clot in the lung.  A collapsed lung (pneumothorax). It can develop suddenly on its own (spontaneous pneumothorax) or from trauma to the chest.  Shingles infection (herpes zoster virus). The chest wall is composed of bones, muscles, and cartilage. Any of these can be the source of the pain.  The bones can be bruised by injury.  The muscles or cartilage can be strained by coughing or overwork.  The cartilage can be affected by inflammation and become sore (costochondritis). DIAGNOSIS  Lab tests or other studies may be needed to find the cause of your pain. Your health care provider may have you take a test called an ambulatory electrocardiogram (ECG). An ECG records your heartbeat patterns over a 24-hour period. You may also have other tests, such as:  Transthoracic echocardiogram (TTE). During echocardiography, sound waves are used to evaluate how blood flows through your heart.  Transesophageal echocardiogram (TEE).  Cardiac monitoring. This allows your health care provider to monitor your heart rate and rhythm in real time.  Holter monitor. This is a portable device that records your heartbeat and can help diagnose heart arrhythmias. It allows your health care provider to  track your heart activity for several days, if needed.  Stress tests by exercise or by giving medicine that makes the heart beat faster. TREATMENT   Treatment depends on what may be causing your chest pain. Treatment may include:  Acid blockers for heartburn.  Anti-inflammatory medicine.  Pain medicine for inflammatory conditions.  Antibiotics if an infection is present.  You may be advised to change lifestyle habits. This includes stopping smoking and avoiding alcohol, caffeine, and chocolate.  You may be advised to keep your head raised (elevated) when sleeping. This reduces the chance of acid going backward from your stomach into your esophagus. Most of the time, nonspecific chest pain will improve within 2-3 days with rest and mild pain medicine.  HOME CARE INSTRUCTIONS   If antibiotics were prescribed, take them as directed. Finish them even if you start to feel better.  For the next few days, avoid physical activities that bring on chest pain. Continue physical activities as directed.  Do not use any tobacco products, including cigarettes, chewing tobacco, or electronic cigarettes.  Avoid drinking alcohol.  Only take medicine as directed by your health care provider.  Follow your health care provider's suggestions for further testing if your chest pain does not go away.  Keep any follow-up appointments you made. If you do not go to an appointment, you could develop lasting (chronic) problems with pain. If there is any problem keeping an appointment, call to reschedule. SEEK MEDICAL CARE IF:   Your chest pain does not go away, even after treatment.  You have a rash with blisters on your chest.  You have a fever. °SEEK IMMEDIATE MEDICAL CARE IF:  °· You have increased chest pain or pain that spreads to your arm, neck, jaw, back, or abdomen. °· You have shortness of breath. °· You have an increasing cough, or you cough up blood. °· You have severe back or abdominal  pain. °· You feel nauseous or vomit. °· You have severe weakness. °· You faint. °· You have chills. °This is an emergency. Do not wait to see if the pain will go away. Get medical help at once. Call your local emergency services (911 in [place] Do not drive yourself to the hospital. °MAKE SURE YOU:  °· Understand these instructions. °· Will watch your condition. °· Will get help right away if you are not doing well or get worse. °Document Released: 06/12/2005 Document Revised: 09/07/2013 Document Reviewed: 04/07/2008 °[employer]® Patient Information ©2015 [employer]. This information is not intended to replace advice given to you by your health care provider. Make sure you discuss any questions you have with your health care provider. ° °

## 2014-08-17 NOTE — ED Notes (Signed)
Pt states that she just went to the bathroom and is unable to provide urine at this time

## 2014-08-17 NOTE — ED Provider Notes (Signed)
CSN: [id number]     Arrival date & time 08/17/14  1404 History   First MD Initiated Contact with Patient 08/17/14 1409     Chief Complaint  Patient presents with  . Panic Attack  . Chest Pain     (Consider location/radiation/quality/duration/timing/severity/associated sxs/prior Treatment) HPI Comments: Stacy Gentry is a 38 y.o. female with a PMHx of asthma, depression, anxiety, syncope, hypoglycemia, migraines, and PCOS, who presents to the ED with complaints of sudden onset chest tightness that occurred approximately 1 hour prior to arrival while she was sitting in her car eating bojangles chicken and iced tea. She states she felt warm, shaky, and had chest tightness that resolved when she stepped outside of her car and got air, but then returned while she was walking upstairs at her office. She states at that time she again felt weak and shaky with chest tightness, as well as pre-syncopal/lightheaded, and her coworkers gave her glucose tabs and PB crackers because she states that she has become hypoglycemic in the past and this felt very similar. This did not help, and her CBG was 130 at that time. She had become diaphoretic during this time therefore the ambulance was called. She reports that by the time the ambulance got there, the chest tightness had improved. She was given oxygen and aspirin in route did not change her symptoms. She reports that now the pain is 3/10 dull tightness along the lateral aspect of her left chest, constant and nonradiating, improved with putting her left arm over her head or laying on her right side, and with no known aggravating factors. Additionally she reports feeling palpitations. She denies any jaw/neck/back radiation or changes with inspiration. States that she had similar symptoms 2 months ago and went to her primary care doctor who diagnosed her with having anxiety and gave her Ativan, which she did not try today. She reports that episode 2 months ago did not  include diaphoresis. She denies any fevers, chills, shortness of breath, wheezing, cough, lower extremity swelling, claudication, PND, orthopnea, back pain, abdominal pain, nausea, vomiting, diarrhea, constipation, indigestion or heartburn, dysuria, hematuria, vaginal discharge/bleeding, numbness, tingling, paresthesias, weakness, syncope, dizziness, vertigo, vision changes, recent travel/immobilization, estrogen use/HRT, cancer, or hx of DVT/PE. She has +FHx of AMI in her uncle who died at age 74. She consumed a large tea today but this is the only caffeine/stimulant use she's had today. Denies IVDU or cocaine use. Denies EtOH or smoking. She states she doesn't feel anxious at this time.   Patient is a 38 y.o. female presenting with chest pain. The history is provided by the patient. No language interpreter was used.  Chest Pain Pain location:  L lateral chest Pain quality: dull   Pain radiates to:  Does not radiate Pain radiates to the back: no   Pain severity:  Mild (3/10) Onset quality:  Sudden Duration:  1 hour Timing:  Constant Progression:  Improving Chronicity:  New Context: eating and at rest   Relieved by:  Certain positions (laying on her R side, and raising her L arm above her head) Worsened by:  Nothing tried Ineffective treatments:  Aspirin (no change after ASA, glucose stick, and PB crackers) Associated symptoms: diaphoresis and palpitations   Associated symptoms: no abdominal pain, no anxiety, no back pain, no claudication, no cough, no dizziness, no fever, no headache, no heartburn, no lower extremity edema, no nausea, no numbness, no orthopnea, no PND, no shortness of breath, no syncope, not vomiting and no weakness  Risk factors: obesity   Risk factors: no birth control, no diabetes mellitus, no high cholesterol, no hypertension, no immobilization, no prior DVT/PE, no smoking and no surgery     Past Medical History  Diagnosis Date  . Asthma   . Depression   . Syncope     . Migraines     Dr.Paul Hassell Done in [place]  . PCOS (polycystic ovarian syndrome)     [facility]  . Anxiety    Past Surgical History  Procedure Laterality Date  . None     Family History  Problem Relation Age of Onset  . Breast cancer Mother 67  . Diabetes Mother   . Arthritis Mother   . Breast cancer  50    Maternal Cousin  . Diabetes Maternal Grandmother   . Arthritis Maternal Grandmother   . Diabetes Maternal Aunt   . Hyperlipidemia Paternal Aunt   . Hyperlipidemia Paternal Uncle   . Stroke Paternal Uncle   . Hypertension    . Kidney disease Paternal Uncle   . Depression    . Heart disease Paternal Uncle 64    Sudden death   History  Substance Use Topics  . Smoking status: Never Smoker   . Smokeless tobacco: Never Used  . Alcohol Use: No   OB History    Gravida Para Term Preterm AB TAB SAB Ectopic Multiple Living   0              Review of Systems  Constitutional: Positive for diaphoresis. Negative for fever and chills.  HENT: Negative for congestion.   Eyes: Negative for visual disturbance.  Respiratory: Positive for chest tightness. Negative for cough, shortness of breath and wheezing.   Cardiovascular: Positive for chest pain and palpitations. Negative for orthopnea, claudication, leg swelling, syncope and PND.  Gastrointestinal: Negative for heartburn, nausea, vomiting, abdominal pain, diarrhea, constipation and blood in stool.  Genitourinary: Negative for dysuria, hematuria, vaginal bleeding and vaginal discharge.  Musculoskeletal: Negative for myalgias, back pain, arthralgias and neck pain.  Skin: Negative for color change.  Neurological: Positive for light-headedness (presyncopal). Negative for dizziness, tremors, syncope, weakness, numbness and headaches.  Psychiatric/Behavioral: Negative for confusion.   10 Systems reviewed and are negative for acute change except as noted in the HPI.    Allergies  Codeine; Flagyl; Hydrocodone; and  Oxycodone  Home Medications   Prior to Admission medications   Medication Sig Start Date End Date Taking? Authorizing Provider  albuterol (VENTOLIN HFA) 108 (90 BASE) MCG/ACT inhaler Inhale 2 puffs into the lungs every 6 (six) hours as needed for wheezing. 09/15/13 09/15/14  Rosalita Chessman, DO  beclomethasone (QVAR) 40 MCG/ACT inhaler Inhale 2 puffs into the lungs 2 (two) times daily. 10/01/13 10/01/14  Midge Minium, MD  cetirizine (ZYRTEC) 10 MG tablet Take 1 tablet (10 mg total) by mouth daily. 02/04/14   Rosalita Chessman, DO  cyclobenzaprine (FLEXERIL) 10 MG tablet Take 1 tablet (10 mg total) by mouth 3 (three) times daily as needed for muscle spasms. 05/19/14   Rosalita Chessman, DO  cyclobenzaprine (FLEXERIL) 5 MG tablet Take 5 mg by mouth 3 (three) times daily as needed. For muscle spasms. 07/15/12   Historical Provider, MD  diclofenac (VOLTAREN) 50 MG EC tablet Take 50 mg by mouth at bedtime.    Historical Provider, MD  hydrocortisone (ANUSOL-HC) 2.5 % rectal cream Place rectally 2 (two) times daily. 06/10/13   Billy Fischer, MD  LORazepam (ATIVAN) 1 MG tablet Take  1 tablet (1 mg total) by mouth at bedtime as needed for anxiety. 06/09/14   Rosalita Chessman, DO  meloxicam (MOBIC) 15 MG tablet Take 1 tablet (15 mg total) by mouth daily. 06/20/14   Rosalita Chessman, DO  naratriptan (AMERGE) 2.5 MG tablet  01/17/14   Historical Provider, MD  ondansetron (ZOFRAN ODT) 8 MG disintegrating tablet Take 1 tablet (8 mg total) by mouth every 8 (eight) hours as needed for nausea or vomiting. 01/19/14   Dorie Rank, MD  predniSONE (DELTASONE) 10 MG tablet 3 po qd for 3 days then 2 po qd for 3 days the 1 po qd for 3 days 06/02/14   Rosalita Chessman, DO  SUMAtriptan Succinate (IMITREX IJ) Inject 6 mg as directed as needed. For migraines.    Historical Provider, MD  topiramate (TOPAMAX) 25 MG tablet Take 50 mg by mouth daily.     Historical Provider, MD   BP 114/68 mmHg  Temp(Src) 98.4 F (36.9 C) (Oral)  Resp 18  Ht 5'  4" (1.626 m)  Wt 240 lb (108.863 kg)  BMI 41.18 kg/m2  SpO2 99%  LMP 08/07/2014 Physical Exam  Constitutional: She is oriented to person, place, and time. She appears well-developed and well-nourished.  Non-toxic appearance. No distress.  Tachycardic in 110s-120s, obese AAF in NAD. Afebrile, nontoxic  HENT:  Head: Normocephalic and atraumatic.  Mouth/Throat: Oropharynx is clear and moist and mucous membranes are normal.  Eyes: Conjunctivae and EOM are normal. Pupils are equal, round, and reactive to light. Right eye exhibits no discharge. Left eye exhibits no discharge.  Neck: Normal range of motion. Neck supple. No JVD present.  Cardiovascular: Regular rhythm, normal heart sounds and intact distal pulses.  Tachycardia present.  Exam reveals no gallop and no friction rub.   No murmur heard. Sinus tachycardia in 110s-120s, nl s1/s2 no m/r/g, distal pulses in BUEs intact and equal, BLE pulses difficult to palpate initially but easily found with doppler and were equal bilaterally  Pulmonary/Chest: Effort normal and breath sounds normal. No respiratory distress. She has no decreased breath sounds. She has no wheezes. She has no rhonchi. She has no rales. She exhibits no tenderness and no deformity.  CTAB in all lung fields No chest wall TTP or deformity  Abdominal: Soft. Normal appearance and bowel sounds are normal. She exhibits no distension. There is tenderness in the left lower quadrant. There is no rigidity, no rebound, no guarding, no tenderness at McBurney's point and negative Murphy's sign.    Soft, obese abdomen which limits exam, nondistended, +BS throughout, with slight tenderness in LLQ (states it's "sore" when pushed) but she states this doesn't hurt when not being palpated, no r/g/r, neg murphy's, neg mcburney's, no CVA TTP   Musculoskeletal: Normal range of motion.  MAE x4 Skin warm and dry with distal extremities well perfused Neg homan's bilaterally, no pedal edema Strength  5/5 in all extremities Sensation grossly intact in all extremities  Neurological: She is alert and oriented to person, place, and time. She has normal strength. No sensory deficit. Gait normal.  Skin: Skin is warm, dry and intact. No rash noted.  Psychiatric: She has a normal mood and affect.  Nursing note and vitals reviewed.   ED Course  Procedures (including critical care time) BPs in all 4 extremities: L ARM: 113/66   R ARM: 108/73 R LEG: 118/67 L LEG: 122/92  Labs Review Labs Reviewed  CBC - Abnormal; Notable for the following:    WBC  16.6 (*)    All other components within normal limits  BASIC METABOLIC PANEL - Abnormal; Notable for the following:    Potassium 3.5 (*)    Glucose, Bld 155 (*)    GFR calc non Af Amer 83 (*)    Anion gap 17 (*)    All other components within normal limits  D-DIMER, QUANTITATIVE - Abnormal; Notable for the following:    D-Dimer, Quant 0.79 (*)    All other components within normal limits  MAGNESIUM  URINALYSIS, ROUTINE W REFLEX MICROSCOPIC  I-STAT TROPOININ, ED  POC URINE PREG, ED    Imaging Review Dg Chest 2 View  08/17/2014   CLINICAL DATA:  Chest pain radiating to the left.  EXAM: CHEST  2 VIEW  COMPARISON:  CT chest of PA and lateral chest 03/07/2014.  FINDINGS: Heart size and mediastinal contours are within normal limits. Both lungs are clear. Visualized skeletal structures are unremarkable.  IMPRESSION: Normal exam.   Electronically Signed   By: Inge Rise M.D.   On: 08/17/2014 15:37   Ct Angio Chest Pe W/cm &/or Wo Cm  08/17/2014   CLINICAL DATA:  Acute chest pain.  Elevated D-dimer.  EXAM: CT ANGIOGRAPHY CHEST WITH CONTRAST  TECHNIQUE: Multidetector CT imaging of the chest was performed using the standard protocol during bolus administration of intravenous contrast. Multiplanar CT image reconstructions and MIPs were obtained to evaluate the vascular anatomy.  CONTRAST:  37mL OMNIPAQUE IOHEXOL 350 MG/ML SOLN  COMPARISON:   03/07/2014  FINDINGS: Chest wall: No breast masses, supraclavicular or axillary lymphadenopathy. The thyroid gland is grossly normal. The bony thorax is intact. No bone lesions or spinal canal compromise.  Mediastinal: The heart is normal in size. No pericardial effusion. No mediastinal or hilar mass or adenopathy. The aorta is normal. The branch vessels are normal. The esophagus is grossly normal.  The pulmonary arterial tree is fairly well opacified. No definite filling defects to suggest pulmonary emboli.  Lungs: No acute pulmonary findings. Minimal dependent subpleural atelectasis. No pleural effusion, pulmonary edema or pneumothorax. No bronchiectasis or interstitial lung disease.  Upper abdomen: Diffuse fatty infiltration of the liver.  Review of the MIP images confirms the above findings.  IMPRESSION: No CT findings for pulmonary embolism.  Normal thoracic aorta.  No acute pulmonary findings.   Electronically Signed   By: Kalman Jewels M.D.   On: 08/17/2014 17:13     EKG Interpretation None    EKG: sinus tachycardia  MDM   Final diagnoses:  Chest pain    38 y.o. female with sudden onset CP while eating. Wide differential at this time, could be anxiety vs ACS vs PE vs indigestion vs other. Pt tachycardic at 110s-120s. ASA and O2 given by EMS without change. Will obtain labs/EKG/CXR and d-dimer, give morphine and fluids as well as GI cocktail, and reassess. Will possibly attempt ativan after morphine. Will hold on NTG for now. Of note, upon chart review, pt does seem to have baseline tachycardia therefore this could be a normal variant for her. Will see if fluids help.  3:56 PM EKG with sinus tachy. CBC showing leukocytosis which seems to have been similar to prior results. BMP showing gluc 155, K3.5. D-dimer 0.79 therefore will proceed with CT chest to r/o PE. Trop neg. CXR neg. Will reassess after fluids/morphine and CT. BPs and pulses in all 4 extremities equal, doubt dissection.  4:34  PM Pt's pain completely resolved, HR stable at 95-98. Mg 1.8. Awaiting CT. HEART score  2. Will likely obtain another troponin at 3hrs (6pm) and then likely d/c if CT neg and trop neg.  5:29 PM CT chest negative. Pt's pain continues to be resolved. She does intermittently have tachycardia to the 110s but this is her baseline. Her repeat trop is scheduled for 6pm, if neg she can follow up with her PCP given that this doesn't seem cardiac in nature, more likely related to anxiety. Will have her see PCP and let them decide on any need for cardiology f/up. Care signed over to [facility] PA-C at shift change. Please see her dictation for further documentation of care.   Meds ordered this encounter  Medications  . sodium chloride 0.9 % bolus 1,000 mL    Sig:   . gi cocktail (Maalox,Lidocaine,Donnatal)    Sig:   . aspirin tablet 325 mg    Sig:   . morphine 4 MG/ML injection 4 mg    Sig:   . diphenhydrAMINE (BENADRYL) injection 25 mg    Sig:   . iohexol (OMNIPAQUE) 350 MG/ML injection 80 mL    Sig:      Patty [place], PA-C 08/17/14 1731  Maudry Diego, MD 08/19/14 1512

## 2014-09-02 ENCOUNTER — Other Ambulatory Visit: Payer: Self-pay | Admitting: Orthopedic Surgery

## 2014-09-02 DIAGNOSIS — M25511 Pain in right shoulder: Secondary | ICD-10-CM

## 2014-09-10 ENCOUNTER — Other Ambulatory Visit: Payer: BC Managed Care – PPO

## 2014-09-17 ENCOUNTER — Ambulatory Visit
Admission: RE | Admit: 2014-09-17 | Discharge: 2014-09-17 | Disposition: A | Discharge status: Home / Self Care | Payer: BC Managed Care – PPO | Source: Ambulatory Visit | Attending: Orthopedic Surgery | Admitting: Orthopedic Surgery

## 2014-09-17 DIAGNOSIS — M25511 Pain in right shoulder: Secondary | ICD-10-CM

## 2014-09-28 ENCOUNTER — Encounter: Payer: Self-pay | Admitting: Neurology

## 2014-09-29 ENCOUNTER — Ambulatory Visit (INDEPENDENT_AMBULATORY_CARE_PROVIDER_SITE_OTHER): Payer: BLUE CROSS/BLUE SHIELD | Admitting: Neurology

## 2014-09-29 ENCOUNTER — Encounter: Payer: Self-pay | Admitting: Neurology

## 2014-09-29 VITALS — BP 113/80 | HR 102 | Temp 97.7°F | Ht 64.0 in | Wt 247.0 lb

## 2014-09-29 DIAGNOSIS — G43009 Migraine without aura, not intractable, without status migrainosus: Secondary | ICD-10-CM

## 2014-09-29 MED ORDER — ONDANSETRON HCL 4 MG PO TABS
4.0000 mg | ORAL_TABLET | Freq: Two times a day (BID) | ORAL | Status: DC | PRN
Start: 2014-09-29 — End: 2016-04-02

## 2014-09-29 MED ORDER — SUMATRIPTAN SUCCINATE REFILL 6 MG/0.5ML ~~LOC~~ SOCT: 6.0000 mg | Freq: Once | SUBCUTANEOUS | Status: DC | PRN

## 2014-09-29 MED ORDER — NARATRIPTAN HCL 2.5 MG PO TABS: ORAL_TABLET | ORAL | Status: DC

## 2014-09-29 MED ORDER — TOPIRAMATE ER 100 MG PO CAP24: 100.0000 mg | ORAL_CAPSULE | Freq: Every day | ORAL | Status: DC

## 2014-09-29 NOTE — Patient Instructions (Addendum)
Please remember, common headache triggers are: sleep deprivation, dehydration, overheating, stress, hypoglycemia or skipping meals and blood sugar fluctuations, excessive pain medications or excessive alcohol use or caffeine withdrawal. Some people have food triggers such as aged cheese, orange juice or chocolate, especially dark chocolate, or MSG (monosodium glutamate). Try to avoid these headache triggers as much possible. It may be helpful to keep a headache diary to figure out what makes your headaches worse or brings them on and what alleviates them. Some people report headache onset after exercise but studies have shown that regular exercise may actually prevent headaches from coming. If you have exercise-induced headaches, please make sure that you drink plenty of fluid before and after exercising and that you do not over do it and do not overheat.  Please try to drink more water, less sweet tea.   When you plan to get pregnant you will need to stop your migraine meds.   We will continue your medications.

## 2014-09-29 NOTE — Progress Notes (Signed)
Subjective:    Patient ID: Stacy Gentry is a 39 y.o. female.  HPI     Star Age, MD, PhD [facility] [address] [place],  [address]  Dear Dr. Hassell Done,  I saw your patient, Stacy Gentry, upon your kind request in my neurologic clinic today for initial consultation of her migraine headaches. The patient is unaccompanied today. As you know, Stacy Gentry is a 39 year old right-handed woman with an underlying medical history of diabetes, asthma and obesity who has a long-standing history of migraines particularly perimenstrual migraines. For abortive treatment she has been on naratriptan and Imitrex injections and for preventative treatment she has been on generic Topamax, 75 mg each bedtime. She also has as needed Flexeril and diclofenac. She was last seen by you on 03/02/2014, at which time she was asked to continue naratriptan 2.5 mg as needed during the week of her menstrual period, diclofenac 75 mg twice a day as needed, Imitrex 6 mg injection as needed, Topamax 75 mg daily at bedtime. She has been on Cambia in the past for as needed use.  She has used Zofran as needed for nausea. She has seen orthopedics for her right shoulder pain and neck pain. She had an MRI cervical spine in November 2015, which I reviewed, which showed multilevel disc bulges. She had some neural foraminal stenoses as well. This was a cervical spine without contrast from 07/31/2014: Straightened cervical lordosis without acute osseous process. No malalignment. Multilevel cervical disc bulges/ protrusions resulting in mild canal stenosis at C4-5 and C6-7, minimal at C5-6. Mild to moderate C4-5 neural foraminal narrowing.  Small T1-2 central disc extrusion resulting in mild canal stenosis.  She also had a right shoulder MRI on 09/17/2014, which showed: 1. Moderate rotator cuff tendinopathy/tendinosis with probable small interstitial tears. No partial or  full-thickness tear. 2. Mild intra-articular tendinopathy involving the long head biceps tendon. 3. Mild to moderate AC joint arthropathy for age as discussed above. 4. Mild subacromial/subdeltoid bursitis. She has seen Dr. Marlou Sa in orthopedics. She had a cortisone injection in her right shoulder recently. Her migraine started when she was in college. [facility]. Her current regimen works reasonably well for her but she's has about 4 migraines a month usually one long episode of migraine right around her menstrual period. She has a family history migraines and her younger sister and 2 maternal aunts. She had a brain MRI in the past which she reports was normal. She had a sleep study some 2 or 3 years ago which she reports excluded obstructive sleep apnea. She denies significant visual aura but has had some blurry vision. Migraine is accompanied by nausea but typically no vomiting and photophobia and fullness in her ears. She has seen an ENT in the past for her ear fullness and was given eardrops. She had no hearing loss and denies any tinnitus. She denies any neurological accompaniment with her migraines such as one-sided weakness, numbness, tingling or facial droop or slurring of speech. She has no vertigo with her migraines.  Her Past Medical History Is Significant For: Past Medical History  Diagnosis Date  . Asthma   . Depression   . Syncope   . PCOS (polycystic ovarian syndrome)     [facility]  . Anxiety   . Diabetes mellitus without complication   . Hypoglycemia   . Migraines     Dr.Paul Hassell Done in [place]  . Bulging  lumbar disc   . Shoulder disorder     Her Past Surgical History Is Significant For: Past Surgical History  Procedure Laterality Date  . None    . Dental surgery      Her Family History Is Significant For: Family History  Problem Relation Age of Onset  . Diabetes Mother   . Arthritis Mother   . Breast cancer Mother 37  .  Hypertension Mother   . Diabetes Maternal Grandmother   . Arthritis Maternal Grandmother   . Diabetes Maternal Aunt   . Hyperlipidemia Paternal Aunt   . Hyperlipidemia Paternal Uncle   . Stroke Paternal Uncle   . Kidney disease Paternal Uncle   . Depression    . Breast cancer      maternal cousin  . Heart disease Paternal Uncle 67    Sudden death  . Hypertension Father   . Migraines Sister     Her Social History Is Significant For: History   Social History  . Marital Status: Single    Spouse Name: N/A    Number of Children: N/A  . Years of Education: N/A   Occupational History  . Clinical cytogeneticist for [employer] History Main Topics  . Smoking status: Never Smoker   . Smokeless tobacco: Never Used  . Alcohol Use: 0.0 oz/week    0 Not specified per week     Comment: social  . Drug Use: No  . Sexual Activity: None   Other Topics Concern  . None   Social History Narrative   Lives alone.   Exercise--no    Her Allergies Are:  Allergies  Allergen Reactions  . Codeine Hives, Shortness Of Breath and Itching  . Flagyl [Metronidazole] Nausea Only  . Hydrocodone Itching  . Oxycodone Itching  . Tussionex Pennkinetic Er [Hydrocod Polst-Cpm Polst Er]   :   Her Current Medications Are:  Outpatient Encounter Prescriptions as of 09/29/2014  Medication Sig  . beclomethasone (QVAR) 40 MCG/ACT inhaler Inhale 2 puffs into the lungs 2 (two) times daily.  . cetirizine (ZYRTEC) 10 MG tablet Take 1 tablet (10 mg total) by mouth daily.  . cyclobenzaprine (FLEXERIL) 5 MG tablet Take 5 mg by mouth 3 (three) times daily as needed. For muscle spasms.  . diclofenac (VOLTAREN) 75 MG EC tablet Take 75 mg by mouth daily. with food  . LORazepam (ATIVAN) 1 MG tablet Take 1 tablet (1 mg total) by mouth at bedtime as needed for anxiety.  . meloxicam (MOBIC) 15 MG tablet Take 1 tablet (15 mg total) by mouth daily.  . naratriptan (AMERGE) 2.5 MG tablet   .  Pseudoephedrine HCl (SUDAFED PO) Take by mouth as needed.  . SUMAtriptan Succinate (IMITREX IJ) Inject 6 mg as directed as needed. For migraines.  . topiramate (TOPAMAX) 25 MG tablet Take 50 mg by mouth daily.   Marland Kitchen albuterol (VENTOLIN HFA) 108 (90 BASE) MCG/ACT inhaler Inhale 2 puffs into the lungs every 6 (six) hours as needed for wheezing.  . hydrocortisone (ANUSOL-HC) 2.5 % rectal cream Place rectally 2 (two) times daily. (Patient not taking: Reported on 09/29/2014)  . [DISCONTINUED] diclofenac (VOLTAREN) 25 MG EC tablet   . [DISCONTINUED] diclofenac (VOLTAREN) 50 MG EC tablet Take 50 mg by mouth at bedtime.  . [DISCONTINUED] fluticasone (FLONASE) 50 MCG/ACT nasal spray Place into both nostrils daily.  . [DISCONTINUED] ondansetron (ZOFRAN ODT) 8 MG disintegrating tablet Take 1 tablet (8 mg total) by mouth every 8 (eight) hours as  needed for nausea or vomiting. (Patient not taking: Reported on 09/29/2014)  . [DISCONTINUED] predniSONE (DELTASONE) 10 MG tablet 3 po qd for 3 days then 2 po qd for 3 days the 1 po qd for 3 days (Patient not taking: Reported on 09/29/2014)  :  Review of Systems:  Out of a complete 14 point review of systems, all are reviewed and negative with the exception of these symptoms as listed below:   Review of Systems  Respiratory: Positive for wheezing.   Musculoskeletal:       Joint pain  Skin:       Skin tags  Neurological: Positive for dizziness and headaches.       Snoring  Psychiatric/Behavioral:       Not enough sleep    Objective:  Neurologic Exam  Physical Exam Physical Examination:   Filed Vitals:   09/29/14 0858  BP: 113/80  Pulse: 102  Temp: 97.7 F (36.5 C)    General Examination: The patient is a very pleasant 39 y.o. female in no acute distress. She appears well-developed and well-nourished and well groomed.   HEENT: Normocephalic, atraumatic, pupils are equal, round and reactive to light and accommodation. Funduscopic exam is normal with  sharp disc margins noted. Extraocular tracking is good without limitation to gaze excursion or nystagmus noted. Normal smooth pursuit is noted. Hearing is grossly intact. Tympanic membranes are clear bilaterally. Face is symmetric with normal facial animation and normal facial sensation. Speech is clear with no dysarthria noted. There is no hypophonia. There is no lip, neck/head, jaw or voice tremor. Neck is supple with full range of passive and active motion. There are no carotid bruits on auscultation. Oropharynx exam reveals: mild mouth dryness, good dental hygiene and moderate airway crowding, due to narrow airway entry, larger tongue and thicker soft palate. Mallampati is class II. Tongue protrudes centrally and palate elevates symmetrically.   Chest: Clear to auscultation without wheezing, rhonchi or crackles noted.  Heart: S1+S2+0, regular and normal without murmurs, rubs or gallops noted.   Abdomen: Soft, non-tender and non-distended with normal bowel sounds appreciated on auscultation.  Extremities: There is no pitting edema in the distal lower extremities bilaterally. Pedal pulses are intact.  Skin: Warm and dry without trophic changes noted. There are no varicose veins.  Musculoskeletal: exam reveals no obvious joint deformities, tenderness or joint swelling or erythema.   Neurologically:  Mental status: The patient is awake, alert and oriented in all 4 spheres. Her immediate and remote memory, attention, language skills and fund of knowledge are appropriate. There is no evidence of aphasia, agnosia, apraxia or anomia. Speech is clear with normal prosody and enunciation. Thought process is linear. Mood is normal and affect is normal.  Cranial nerves II - XII are as described above under HEENT exam. In addition: shoulder shrug is normal with equal shoulder height noted. Motor exam: Normal bulk, strength and tone is noted. There is no drift, tremor or rebound. Romberg is negative. Reflexes  are 2+ throughout. Babinski: Toes are flexor bilaterally. Fine motor skills and coordination: intact with normal finger taps, normal hand movements, normal rapid alternating patting, normal foot taps and normal foot agility.  Cerebellar testing: No dysmetria or intention tremor on finger to nose testing. Heel to shin is unremarkable bilaterally. There is no truncal or gait ataxia.  Sensory exam: intact to light touch, pinprick, vibration, temperature sense in the upper and lower extremities.  Gait, station and balance: She stands easily. No veering to one side  is noted. No leaning to one side is noted. Posture is age-appropriate and stance is narrow based. Gait shows normal stride length and normal pace. No problems turning are noted. She turns en bloc. Tandem walk is unremarkable.   Assessment and Plan:  In summary, Jewelle M Bonser is a very pleasant 39 y.o.-year old female with an underlying medical history of diabetes, asthma and obesity who has a long-standing history of migraines particularly perimenstrual migraines. Symptoms started in her early 61s and she has migraines without aura, associated with nausea, photophobia, and fullness reported in both the ears. Her physical exam was normal. She had an MRI in the past which was normal per her report. She also had a sleep study to 3 years ago which was negative for obstructive sleep apnea per her report. For preventative treatment she has been on Topamax but still reports about 4 days of headaches typically, in continuation during her menstrual period. She reports no side effects. Of note, her triggers are weather changes, pressure changes in particular when she travels by air, and her menstrual period. She tries to stay well-hydrated.  I had a long chat with the patient about my findings and the diagnosis of migraines, the prognosis and treatment options. We talked about medical treatments and non-pharmacological approaches. We talked about maintaining  a healthy lifestyle in general. I encouraged the patient to eat healthy, exercise daily and keep well hydrated, to keep a scheduled bedtime and wake time routine, to not skip any meals and eat healthy snacks in between meals and to have protein with every meal.   I advised the patient about common headache triggers: sleep deprivation, dehydration, overheating, stress, hypoglycemia or skipping meals and blood sugar fluctuations, excessive pain medications or excessive alcohol use or caffeine withdrawal. Some people have food triggers such as aged cheese, orange juice or chocolate, especially dark chocolate, or MSG (monosodium glutamate). She is to try to avoid these headache triggers as much possible. It may be helpful to keep a headache diary to figure out what makes Her headaches worse or brings them on and what alleviates them. Some people report headache onset after exercise but studies have shown that regular exercise may actually prevent headaches from coming. If She has exercise-induced headaches, She is advised to drink plenty of fluid before and after exercising and that to not overdo it and to not overheat. As far as medications are concerned, I recommended the following at this time: I would like for her to continue Topamax as a preventative but suggested once daily Trokendi XR 100 mg qHS. . She can continue diclofenac as needed as well as Amerge as needed and Imitrex injections as needed. I provided refills for these medications and also provided her with a prescription for Zofran for nausea. I would like to see her back routinely in 4-5 months, sooner if need be. I answered all her questions today and the patient was in agreement. She is encouraged to call or email with any interim questions or concerns or refill requests  Thank you very much for allowing me to participate in the care of this nice patient. If I can be of any further assistance to you please do not hesitate to call me at  [phone].  Sincerely,   Star Age, MD, PhD

## 2014-10-31 ENCOUNTER — Ambulatory Visit: Payer: Self-pay | Admitting: Family Medicine

## 2014-11-01 ENCOUNTER — Encounter: Payer: Self-pay | Admitting: Family Medicine

## 2014-11-01 ENCOUNTER — Ambulatory Visit (INDEPENDENT_AMBULATORY_CARE_PROVIDER_SITE_OTHER): Payer: BLUE CROSS/BLUE SHIELD | Admitting: Family Medicine

## 2014-11-01 VITALS — BP 122/82 | HR 135 | Temp 99.7°F | Wt 240.0 lb

## 2014-11-01 DIAGNOSIS — K219 Gastro-esophageal reflux disease without esophagitis: Secondary | ICD-10-CM

## 2014-11-01 MED ORDER — OMEPRAZOLE 40 MG PO CPDR: 40.0000 mg | DELAYED_RELEASE_CAPSULE | Freq: Every day | ORAL | Status: DC

## 2014-11-01 NOTE — Progress Notes (Signed)
Pre visit review using our clinic review tool, if applicable. No additional management support is needed unless otherwise documented below in the visit note. 

## 2014-11-01 NOTE — Patient Instructions (Addendum)
Stop the trokendi xr and see if symptoms resolve. If not call office.     Food Choices for Gastroesophageal Reflux Disease When you have gastroesophageal reflux disease (GERD), the foods you eat and your eating habits are very important. Choosing the right foods can help ease the discomfort of GERD. WHAT GENERAL GUIDELINES DO I NEED TO FOLLOW?  Choose fruits, vegetables, whole grains, low-fat dairy products, and low-fat meat, fish, and poultry.  Limit fats such as oils, salad dressings, butter, nuts, and avocado.  Keep a food diary to identify foods that cause symptoms.  Avoid foods that cause reflux. These may be different for different people.  Eat frequent small meals instead of three large meals each day.  Eat your meals slowly, in a relaxed setting.  Limit fried foods.  Cook foods using methods other than frying.  Avoid drinking alcohol.  Avoid drinking large amounts of liquids with your meals.  Avoid bending over or lying down until 2-3 hours after eating. WHAT FOODS ARE NOT RECOMMENDED? The following are some foods and drinks that may worsen your symptoms: Vegetables Tomatoes. Tomato juice. Tomato and spaghetti sauce. Chili peppers. Onion and garlic. Horseradish. Fruits Oranges, grapefruit, and lemon (fruit and juice). Meats High-fat meats, fish, and poultry. This includes hot dogs, ribs, ham, sausage, salami, and bacon. Dairy Whole milk and chocolate milk. Sour cream. Cream. Butter. Ice cream. Cream cheese.  Beverages Coffee and tea, with or without caffeine. Carbonated beverages or energy drinks. Condiments Hot sauce. Barbecue sauce.  Sweets/Desserts Chocolate and cocoa. Donuts. Peppermint and spearmint. Fats and Oils High-fat foods, including [place] fries and potato chips. Other Vinegar. Strong spices, such as black pepper, white pepper, red pepper, cayenne, curry powder, cloves, ginger, and chili powder. The items listed above may not be a complete list of  foods and beverages to avoid. Contact your dietitian for more information. Document Released: 09/02/2005 Document Revised: 09/07/2013 Document Reviewed: 07/07/2013 [facility] Patient Information 2015 [place], [place]. This information is not intended to replace advice given to you by your health care provider. Make sure you discuss any questions you have with your health care provider.

## 2014-11-01 NOTE — Progress Notes (Signed)
  Subjective:     Stacy Gentry is a 39 y.o. female who presents for evaluation of abdominal cramping and loose stools. Onset of symptoms was 3 weeks ago. Current symptoms: crampy abdominal pain: moderate: location: in the lower abdomen. Patient denies anorexia, arthralagias, chills, fever, frequency, hematochezia, hematuria, melena, myalgias and vomiting. Symptoms have gradually worsened. Previous visits for this problem: none. Evaluation to date has been none. Treatment to date has been none.  Pt was started on trokendi xr by neuro and shortly after the symptoms started.    The following portions of the patient's history were reviewed and updated as appropriate: allergies, current medications, past family history, past medical history, past social history, past surgical history and problem list.  Review of Systems Pertinent items are noted in HPI.   Objective:    BP 122/82 mmHg  Pulse 135  Temp(Src) 99.7 F (37.6 C) (Oral)  Wt 240 lb (108.863 kg)  SpO2 98% General appearance: alert, cooperative, appears stated age and no distress Lungs: clear to auscultation bilaterally Heart: regular rate and rhythm, S1, S2 normal, no murmur, click, rub or gallop Abdomen: soft, non-tender; bowel sounds normal; no masses,  no organomegaly   Assessment:    Irritable bowel syndrome which has stabilized.    dyspepsia  Plan:    Discussed dietary advice. Discussed fiber supplementation. stop new med started by neuro and see if symptoms improve   Omeprazole levsin If symptoms do not improve--- refer to GI

## 2014-11-10 ENCOUNTER — Other Ambulatory Visit: Payer: Self-pay

## 2014-11-10 DIAGNOSIS — R109 Unspecified abdominal pain: Secondary | ICD-10-CM

## 2014-11-11 ENCOUNTER — Encounter: Payer: Self-pay | Admitting: Family Medicine

## 2014-11-11 ENCOUNTER — Encounter: Payer: Self-pay | Admitting: Internal Medicine

## 2014-11-15 ENCOUNTER — Telehealth: Payer: Self-pay | Admitting: Neurology

## 2014-11-15 NOTE — Telephone Encounter (Signed)
Talked with Stacy Gentry, she stopped the Trokendi on 2/16. GI symptoms are "a little" better but PCP has referred her for GI consult. Would you like to try another medication now?

## 2014-11-15 NOTE — Telephone Encounter (Signed)
Patient stated she starting taking Rx Topiramate ER (TROKENDI XR) 100 MG CP24 and experienced diarrhea and constipation.  Saw PCP and was advised to discontinue medication.  She's questioning if there's an alternative medication.  Please call and advise.

## 2014-11-15 NOTE — Telephone Encounter (Signed)
Once she is off of the medication we need to make sure that her GI symptoms have improved. Please ask patient to call us back in the next week or 2 after her GI symptoms have resolved. We will consider another preventative medication for migraines at the time.

## 2014-11-15 NOTE — Telephone Encounter (Signed)
Let's await GI consult and what they say. It is possible that symptoms are not related to the Trokendi. Let's not have her try anything new. Please advise patient

## 2014-11-16 NOTE — Telephone Encounter (Signed)
Talked to [facility], she is aware and she will call us after GI consult.

## 2014-12-27 ENCOUNTER — Ambulatory Visit: Payer: BLUE CROSS/BLUE SHIELD | Admitting: Internal Medicine

## 2015-01-23 ENCOUNTER — Encounter: Payer: BLUE CROSS/BLUE SHIELD | Admitting: Family Medicine

## 2015-02-21 ENCOUNTER — Ambulatory Visit (INDEPENDENT_AMBULATORY_CARE_PROVIDER_SITE_OTHER): Payer: BLUE CROSS/BLUE SHIELD | Admitting: Internal Medicine

## 2015-02-21 ENCOUNTER — Encounter: Payer: Self-pay | Admitting: Internal Medicine

## 2015-02-21 ENCOUNTER — Other Ambulatory Visit: Payer: Self-pay | Admitting: Neurology

## 2015-02-21 VITALS — BP 128/80 | HR 64 | Ht 64.0 in | Wt 245.4 lb

## 2015-02-21 DIAGNOSIS — K589 Irritable bowel syndrome without diarrhea: Secondary | ICD-10-CM | POA: Diagnosis not present

## 2015-02-21 DIAGNOSIS — K219 Gastro-esophageal reflux disease without esophagitis: Secondary | ICD-10-CM

## 2015-02-21 MED ORDER — CILIDINIUM-CHLORDIAZEPOXIDE 2.5-5 MG PO CAPS: 1.0000 | ORAL_CAPSULE | Freq: Three times a day (TID) | ORAL | Status: DC

## 2015-02-21 NOTE — Patient Instructions (Signed)
We have sent the following medications to your pharmacy for you to pick up at your convenience:  Librax  Continue taking your Omeprazole daily   Please follow up with Dr. Henrene Pastor on 04/07/2015 at 3:15pm

## 2015-02-21 NOTE — Progress Notes (Signed)
HISTORY OF PRESENT ILLNESS:  Stacy Gentry is a 39 y.o. female with obesity, anxiety/depression, and migraine headaches. She is sent todayby her primary care provider, Dr. Etter Sjogren regarding upper and lower GI complaints.first, patient reports the development of nausea and discomfort in her chest prior to meals. Improve with meals. Burning at times. No vomiting. No dysphagia. No weight loss. Next, she reports the development of postprandial abdominal cramping with urgency and loose stools the first of this year. Severe abdominal cramping discomfort is relieved with defecation. Symptoms are exacerbated by meals 50% of the time. Also seemed to be exacerbated by stress. She thought this was due to a new migraine headache medication, topiramate. However, upon discontinuation of medication, symptoms persisted. No nocturnal component, weight loss, or bleeding. For her upper GI complaints she was prescribed omeprazole which she has used only sporadically with ineffective results. No treatment for lower GI complaints.she denies a prior history of similar problems though she did undergo an abdominal ultrasound in 2013 to evaluate upper abdominal pain. This was unremarkable. 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 Asthma   . Depression   . Syncope   . PCOS (polycystic ovarian syndrome)     [facility]  . Anxiety   . Diabetes mellitus without complication   . Hypoglycemia   . Migraines     Dr.Paul Hassell Done in [place]  . Bulging lumbar disc   . Shoulder disorder   . GERD (gastroesophageal reflux disease)     Past Surgical History  Procedure Laterality Date  . None    . Dental surgery      Social History Stacy Gentry  reports that she has never smoked. She has never used smokeless tobacco. She reports that she does not drink alcohol or  use illicit drugs.  family history includes Arthritis in her maternal grandmother and mother; Breast cancer in an other family member; Breast cancer (age of onset: 21) in her mother; Depression in an other family member; Diabetes in her maternal aunt, maternal grandmother, and mother; Heart disease (age of onset: 48) in her paternal uncle; Hyperlipidemia in her paternal aunt and paternal uncle; Hypertension in her father and mother; Kidney disease in her paternal uncle; Migraines in her sister; Stroke in her paternal uncle.  Allergies  Allergen Reactions  . Codeine Hives, Shortness Of Breath and Itching  . Flagyl [Metronidazole] Nausea Only  . Hydrocodone Itching  . Oxycodone Itching  . Tussionex Pennkinetic Er [Hydrocod Polst-Cpm Polst Er]        PHYSICAL EXAMINATION: Vital signs: BP 128/80 mmHg  Pulse 64  Ht 5\' 4"  (1.626 m)  Wt 245 lb 6.4 oz (111.313 kg)  BMI 42.10 kg/m2  LMP 02/15/2015  Constitutional: pleasant, obese,generally well-appearing, no acute distress Psychiatric: alert and oriented x3, cooperative Eyes: extraocular movements intact, anicteric, conjunctiva pink Mouth: oral pharynx moist, no lesions Neck: supple no lymphadenopathy Cardiovascular: heart regular rate and rhythm, no murmur Lungs: clear to auscultation bilaterally Abdomen: soft, obese, nontender, nondistended, no obvious ascites, no peritoneal signs, normal bowel sounds, no organomegaly Rectal:omitted Extremities: no lower extremity edema bilaterally Skin: no lesions on visible extremities Neuro: No focal deficits. No asterixis.    ASSESSMENT:  #1. Upper abdominal complaints with preprandial nausea and chest burning consistent with GERD #2. Lower GI complaints manifested by postprandial abdominal cramping with urgency  and loose stools. Consistent with IBS #3. Morbid obesity   PLAN:  #1. Reflux precautions with attention to weight loss #2. Take omeprazole 40 mg DAILY, 30 minutes prior to first  meal #3. Prescribed Librax one before meals as needed #4. GI office follow-up for weeks  A copy of this consultation and has been sent to Dr. Etter Sjogren

## 2015-02-23 ENCOUNTER — Telehealth: Payer: Self-pay | Admitting: Family Medicine

## 2015-02-23 NOTE — Telephone Encounter (Signed)
Pre Visit letter sent  °

## 2015-03-07 ENCOUNTER — Ambulatory Visit (INDEPENDENT_AMBULATORY_CARE_PROVIDER_SITE_OTHER): Payer: BLUE CROSS/BLUE SHIELD | Admitting: Neurology

## 2015-03-07 ENCOUNTER — Encounter: Payer: Self-pay | Admitting: Neurology

## 2015-03-07 VITALS — BP 132/88 | HR 92 | Resp 16 | Ht 64.0 in | Wt 246.0 lb

## 2015-03-07 DIAGNOSIS — G43009 Migraine without aura, not intractable, without status migrainosus: Secondary | ICD-10-CM | POA: Diagnosis not present

## 2015-03-07 MED ORDER — NARATRIPTAN HCL 2.5 MG PO TABS: ORAL_TABLET | ORAL | Status: DC

## 2015-03-07 MED ORDER — SUMATRIPTAN SUCCINATE 6 MG/0.5ML ~~LOC~~ SOAJ: SUBCUTANEOUS | Status: DC

## 2015-03-07 MED ORDER — GABAPENTIN 100 MG PO CAPS: ORAL_CAPSULE | ORAL | Status: DC

## 2015-03-07 NOTE — Patient Instructions (Signed)
For abortive migraine treatment: you can continue to use Amerge by mouth as needed for migraine or the Imitrex injection (not both together) For preventative migraine treatment: we will start Neurontin (gabapentin) 100 mg strength: Take 1 pill nightly at bedtime for 1 week, then 2 pills nightly for 1 week, then 3 pills each night thereafter. The most common side effects reported are sedation or sleepiness. Rare side effects include balance problems, confusion.  Follow up in about 4 months.  Please remember, common headache triggers are: sleep deprivation, dehydration, overheating, stress, hypoglycemia or skipping meals and blood sugar fluctuations, excessive pain medications or excessive alcohol use or caffeine withdrawal. Some people have food triggers such as aged cheese, orange juice or chocolate, especially dark chocolate, or MSG (monosodium glutamate). Try to avoid these headache triggers as much possible. It may be helpful to keep a headache diary to figure out what makes your headaches worse or brings them on and what alleviates them. Some people report headache onset after exercise but studies have shown that regular exercise may actually prevent headaches from coming. If you have exercise-induced headaches, please make sure that you drink plenty of fluid before and after exercising and that you do not over do it and do not overheat.

## 2015-03-07 NOTE — Progress Notes (Signed)
Subjective:    Patient ID: Stacy Gentry is a 39 y.o. female.  HPI     Interim history:   Stacy Gentry is a 39 year old right-handed woman with an underlying medical history of diabetes, asthma and obesity who presents for follow-up consultation of her migraine headaches. The patient is unaccompanied today. I first met her on 09/29/2014 at the request of her GYN, at which time the patient reported a long-standing history of migraines, particularly perimenstrual migraines. She was on generic Topamax 75 mg at bedtime and I suggested she change it to long-acting Trokendi XR 100 mg each night. I suggested Zofran for nausea and refill tender prescriptions for Imitrex injections as needed and Amerge PO as needed. She called back in March reporting diarrhea and constipation and that she had been advised by her primary care physician to stop the topiramate. She had been referred to GI.   In the interim, she was seen by Dr. Henrene Pastor in GI on 02/21/2015. I reviewed his office note. She had symptoms consistent with reflux disease and was advised to take omeprazole daily. She also had symptoms in keeping with irritable bowel syndrome and was prescribed Librax as needed. She was advised to pursue weight loss.   Today, 03/07/2015: She reports doing a little better, GI-wise. Her migraines are the same, last about 2 to 3 days at a time. She may have tried amitriptyline in the past. She is off of Topamax. She is off of Trokendi XR, she sometimes takes Flexeril or diclofenac or both for her neck pain. She still sees Dr. Marlou Sa at [facility]. For nausea she takes Zofran as needed.  Previously:   She has a long-standing history of migraines particularly perimenstrual migraines. For abortive treatment she has been on naratriptan and Imitrex injections and for preventative treatment she has been on generic Topamax, 75 mg each bedtime. She also has as needed Flexeril and diclofenac. She was last seen by you  on 03/02/2014, at which time she was asked to continue naratriptan 2.5 mg as needed during the week of her menstrual period, diclofenac 75 mg twice a day as needed, Imitrex 6 mg injection as needed, Topamax 75 mg daily at bedtime. She has been on Cambia in the past for as needed use.  She has used Zofran as needed for nausea. She has seen orthopedics for her right shoulder pain and neck pain. She had an MRI cervical spine in November 2015, which I reviewed, which showed multilevel disc bulges. She had some neural foraminal stenoses as well. This was a cervical spine without contrast from 07/31/2014: Straightened cervical lordosis without acute osseous process. No malalignment. Multilevel cervical disc bulges/ protrusions resulting in mild canal stenosis at C4-5 and C6-7, minimal at C5-6. Mild to moderate C4-5 neural foraminal narrowing.   Small T1-2 central disc extrusion resulting in mild canal stenosis.   She also had a right shoulder MRI on 09/17/2014, which showed: 1. Moderate rotator cuff tendinopathy/tendinosis with probable small interstitial tears. No partial or full-thickness tear. 2. Mild intra-articular tendinopathy involving the long head biceps tendon. 3. Mild to moderate AC joint arthropathy for age as discussed above. 4. Mild subacromial/subdeltoid bursitis. She has seen Dr. Marlou Sa in orthopedics. She had a cortisone injection in her right shoulder recently. Her migraine started when she was in college. [facility]. Her current regimen works reasonably well for her but she's has about 4 migraines a month usually one long episode of migraine right  around her menstrual period. She has a family history migraines and her younger sister and 2 maternal aunts. She had a brain MRI in the past which she reports was normal. She had a sleep study some 2 or 3 years ago which she reports excluded obstructive sleep apnea. She denies significant visual aura but has had some  blurry vision. Migraine is accompanied by nausea but typically no vomiting and photophobia and fullness in her ears. She has seen an ENT in the past for her ear fullness and was given eardrops. She had no hearing loss and denies any tinnitus. She denies any neurological accompaniment with her migraines such as one-sided weakness, numbness, tingling or facial droop or slurring of speech. She has no vertigo with her migraines.   Her Past Medical History Is Significant For: Past Medical History  Diagnosis Date  . Asthma   . Depression   . Syncope   . PCOS (polycystic ovarian syndrome)     [facility]  . Anxiety   . Diabetes mellitus without complication   . Hypoglycemia   . Migraines     Dr.Paul Hassell Done in [place]  . Bulging lumbar disc   . Shoulder disorder   . GERD (gastroesophageal reflux disease)     Her Past Surgical History Is Significant For: Past Surgical History  Procedure Laterality Date  . None    . Dental surgery      Her Family History Is Significant For: Family History  Problem Relation Age of Onset  . Diabetes Mother   . Arthritis Mother   . Breast cancer Mother 58  . Hypertension Mother   . Diabetes Maternal Grandmother   . Arthritis Maternal Grandmother   . Diabetes Maternal Aunt   . Hyperlipidemia Paternal Aunt   . Hyperlipidemia Paternal Uncle   . Stroke Paternal Uncle   . Kidney disease Paternal Uncle   . Depression    . Breast cancer      maternal cousin  . Heart disease Paternal Uncle 32    Sudden death  . Hypertension Father   . Migraines Sister     Her Social History Is Significant For: History   Social History  . Marital Status: Single    Spouse Name: N/A  . Number of Children: 0  . Years of Education: Masters   Occupational History  . Clinical cytogeneticist for [employer] History Main Topics  . Smoking status: Never Smoker   . Smokeless tobacco: Never Used  . Alcohol Use: No     Comment: social  . Drug  Use: No  . Sexual Activity: Not on file   Other Topics Concern  . None   Social History Narrative   Lives alone.   Exercise--no   Consumes about 2 cups of sweet tea week, 1 ginger ale a day     Her Allergies Are:  Allergies  Allergen Reactions  . Codeine Hives, Shortness Of Breath and Itching  . Flagyl [Metronidazole] Nausea Only  . Hydrocodone Itching  . Oxycodone Itching  . Tussionex Pennkinetic Er [Hydrocod Polst-Cpm Polst Er]   :   Her Current Medications Are:  Outpatient Encounter Prescriptions as of 03/07/2015  Medication Sig  . cetirizine (ZYRTEC) 10 MG tablet Take 1 tablet (10 mg total) by mouth daily.  . clidinium-chlordiazePOXIDE (LIBRAX) 5-2.5 MG per capsule Take 1 capsule by mouth 3 (three) times daily before meals.  . cyclobenzaprine (FLEXERIL) 5 MG tablet Take 5 mg by mouth 3 (three)  times daily as needed. For muscle spasms.  . diclofenac (VOLTAREN) 75 MG EC tablet Take 75 mg by mouth daily. with food  . hydrocortisone (ANUSOL-HC) 2.5 % rectal cream Place rectally 2 (two) times daily.  Marland Kitchen LORazepam (ATIVAN) 1 MG tablet Take 1 tablet (1 mg total) by mouth at bedtime as needed for anxiety.  . meloxicam (MOBIC) 15 MG tablet Take 1 tablet (15 mg total) by mouth daily.  . naratriptan (AMERGE) 2.5 MG tablet take 1 pill for migraine as needed. Can take second pill after 2 hours, no more than 2 pills in 24 hours. No more than 3 pills a week.  Marland Kitchen omeprazole (PRILOSEC) 40 MG capsule Take 1 capsule (40 mg total) by mouth daily.  . ondansetron (ZOFRAN) 4 MG tablet Take 1 tablet (4 mg total) by mouth 2 (two) times daily as needed for nausea or vomiting.  . Pseudoephedrine HCl (SUDAFED PO) Take by mouth as needed.  . SUMAtriptan 6 MG/0.5ML SOAJ INJECT 6 MG INTO SKIN ONCE AS NEEDED AS DIRECTED, NO MORE THAN ONCE WEEKLY AS NEEDED  . albuterol (VENTOLIN HFA) 108 (90 BASE) MCG/ACT inhaler Inhale 2 puffs into the lungs every 6 (six) hours as needed for wheezing.  . beclomethasone (QVAR)  40 MCG/ACT inhaler Inhale 2 puffs into the lungs 2 (two) times daily.  . [DISCONTINUED] Topiramate ER (TROKENDI XR) 100 MG CP24 Take 100 mg by mouth at bedtime.   No facility-administered encounter medications on file as of 03/07/2015.  :  Review of Systems:  Out of a complete 14 point review of systems, all are reviewed and negative with the exception of these symptoms as listed below:   Review of Systems  Neurological:       Patient was unable to take Trokendi due to stomach upset and acid reflux. Patient did consult PCP and GI doctor about this. Reports that her migraine status is the same since stopping medication. Starts around menstrual cycle and last a couple of days.    Objective:  Neurologic Exam  Physical Exam Physical Examination:   Filed Vitals:   03/07/15 1534  BP: 132/88  Pulse: 92  Resp: 16    General Examination: The patient is a very pleasant 39 y.o. female in no acute distress. She appears well-developed and well-nourished and well groomed. She is in good spirits today.   HEENT: Normocephalic, atraumatic, pupils are equal, round and reactive to light and accommodation. Funduscopic exam is normal with sharp disc margins noted. Extraocular tracking is good without limitation to gaze excursion or nystagmus noted. Normal smooth pursuit is noted. Hearing is grossly intact. Face is symmetric with normal facial animation and normal facial sensation. Speech is clear with no dysarthria noted. There is no hypophonia. There is no lip, neck/head, jaw or voice tremor. Neck is supple with full range of passive and active motion. There are no carotid bruits on auscultation. Oropharynx exam reveals: mild mouth dryness, good dental hygiene and moderate airway crowding, due to narrow airway entry, larger tongue and thicker soft palate. Mallampati is class II. Tongue protrudes centrally and palate elevates symmetrically.   Chest: Clear to auscultation without wheezing, rhonchi or  crackles noted.  Heart: S1+S2+0, regular and normal without murmurs, rubs or gallops noted.   Abdomen: Soft, non-tender and non-distended with normal bowel sounds appreciated on auscultation.  Extremities: There is no pitting edema in the distal lower extremities bilaterally. Pedal pulses are intact.  Skin: Warm and dry without trophic changes noted. There are no varicose  veins.  Musculoskeletal: exam reveals no obvious joint deformities, tenderness or joint swelling or erythema.   Neurologically:  Mental status: The patient is awake, alert and oriented in all 4 spheres. Her immediate and remote memory, attention, language skills and fund of knowledge are appropriate. There is no evidence of aphasia, agnosia, apraxia or anomia. Speech is clear with normal prosody and enunciation. Thought process is linear. Mood is normal and affect is normal.  Cranial nerves II - XII are as described above under HEENT exam. In addition: shoulder shrug is normal with equal shoulder height noted. Motor exam: Normal bulk, strength and tone is noted. There is no drift, tremor or rebound. Romberg is negative. Reflexes are 2+ throughout. Fine motor skills and coordination: intact with normal finger taps, normal hand movements, normal rapid alternating patting, normal foot taps and normal foot agility.  Cerebellar testing: No dysmetria or intention tremor on finger to nose testing. Heel to shin is unremarkable bilaterally. There is no truncal or gait ataxia.  Sensory exam: intact to light touch in the upper and lower extremities.  Gait, station and balance: She stands easily. No veering to one side is noted. No leaning to one side is noted. Posture is age-appropriate and stance is narrow based. Gait shows normal stride length and normal pace. No problems turning are noted. She turns en bloc. Tandem walk is unremarkable.   Assessment and Plan:  In summary, Stacy Gentry is a very pleasant 39 year old female with an  underlying medical history of diabetes, asthma, obesity, and recent diagnoses of reflux disease and irritable bowel syndrome, who presents for follow-up consultation of her long-standing history of migraines without aura, particularly perimenstrual migraines. She was on Topamax for quite some time. I suggested a trial of Trokendi XR 100 mg each night but she could not tolerate it. In the past she may have tried amitriptyline for headache prevention. Symptoms started in her early 27s and she has migraines without aura, associated with nausea, photophobia, and fullness reported in both the ears. Her physical exam was normal. She had an MRI in the past which was normal per her report. She also had a sleep study to 3 years ago which was negative for obstructive sleep apnea per her report. When she was on topiramate she still had significant residual migraine headaches. At this juncture, I suggested that she continue to take Amerge by mouth as needed for abortive treatment or, if needed, Imitrex injections, she is advised not to combine Amerge and Imitrex. She can continue to use Zofran as needed for nausea. For preventative treatment, I suggested a trial of Neurontin, starting at 100 mg each night with gradual titration to 300 mg in weekly increments. I talked to her about the titration and potential side effects. She received instructions in writing as well. Of note, her migraine triggers are weather changes, pressure changes in particular when she travels by air, and her menstrual period.  I again talked to her about the diagnosis of migraines, the prognosis and treatment options. We talked about medical treatments and non-pharmacological approaches. We talked about maintaining a healthy lifestyle in general. I encouraged the patient to eat healthy, exercise daily and keep well hydrated, to keep a scheduled bedtime and wake time routine, to not skip any meals and eat healthy snacks in between meals and to have  protein with every meal.   I advised the patient about common headache triggers: sleep deprivation, dehydration, overheating, stress, hypoglycemia or skipping meals and blood  sugar fluctuations, excessive pain medications or excessive alcohol use or caffeine withdrawal. Some people have food triggers such as aged cheese, orange juice or chocolate, especially dark chocolate, or MSG (monosodium glutamate). She is to try to avoid these headache triggers as much possible. It may be helpful to keep a headache diary to figure out what makes Her headaches worse or brings them on and what alleviates them. Some people report headache onset after exercise but studies have shown that regular exercise may actually prevent headaches from coming. If She has exercise-induced headaches, She is advised to drink plenty of fluid before and after exercising and that to not overdo it and to not overheat. She is going to start a new job soon. I asked her to make a follow-up appointment in about 4 months, sooner if the need arises. I provided her with new prescriptions for Imitrex injection, Amerge, and Neurontin. She did not need a refill on Zofran. I answered all her questions today and she was in agreement.

## 2015-03-15 ENCOUNTER — Encounter: Payer: Self-pay | Admitting: Behavioral Health

## 2015-03-15 ENCOUNTER — Telehealth: Payer: Self-pay | Admitting: Behavioral Health

## 2015-03-15 NOTE — Telephone Encounter (Signed)
Pre-Visit Call completed with patient and chart updated.   Pre-Visit Info documented in Specialty Comments under [employer].    

## 2015-03-16 ENCOUNTER — Encounter: Payer: BLUE CROSS/BLUE SHIELD | Admitting: Family Medicine

## 2015-03-16 DIAGNOSIS — Z0289 Encounter for other administrative examinations: Secondary | ICD-10-CM

## 2015-03-17 ENCOUNTER — Encounter: Payer: Self-pay | Admitting: Family Medicine

## 2015-03-17 ENCOUNTER — Telehealth: Payer: Self-pay | Admitting: Family Medicine

## 2015-03-17 NOTE — Telephone Encounter (Signed)
Pt was no show 03/16/15 9:30am, cpe appt, pt has not rescheduled, mailing letter, charge?

## 2015-03-17 NOTE — Telephone Encounter (Signed)
charge 

## 2015-04-02 ENCOUNTER — Other Ambulatory Visit: Payer: Self-pay | Admitting: Family Medicine

## 2015-04-07 ENCOUNTER — Ambulatory Visit: Payer: BLUE CROSS/BLUE SHIELD | Admitting: Internal Medicine

## 2015-07-10 ENCOUNTER — Ambulatory Visit: Payer: Self-pay | Admitting: Neurology

## 2016-01-02 ENCOUNTER — Ambulatory Visit: Payer: Self-pay | Admitting: Family Medicine

## 2016-03-18 ENCOUNTER — Encounter (HOSPITAL_COMMUNITY): Payer: Self-pay | Admitting: Emergency Medicine

## 2016-03-18 ENCOUNTER — Ambulatory Visit (HOSPITAL_COMMUNITY)
Admission: EM | Admit: 2016-03-18 | Discharge: 2016-03-18 | Disposition: A | Discharge status: Home / Self Care | Payer: 59 | Attending: Emergency Medicine | Admitting: Emergency Medicine

## 2016-03-18 DIAGNOSIS — G43911 Migraine, unspecified, intractable, with status migrainosus: Secondary | ICD-10-CM | POA: Diagnosis not present

## 2016-03-18 MED ORDER — METOCLOPRAMIDE HCL 5 MG/ML IJ SOLN
INTRAMUSCULAR | Status: AC
  Filled 2016-03-18: qty 2

## 2016-03-18 MED ORDER — DEXAMETHASONE SODIUM PHOSPHATE 10 MG/ML IJ SOLN
INTRAMUSCULAR | Status: AC
  Filled 2016-03-18: qty 1

## 2016-03-18 MED ORDER — METOCLOPRAMIDE HCL 5 MG/ML IJ SOLN
5.0000 mg | Freq: Once | INTRAMUSCULAR | Status: AC
  Administered 2016-03-18: 5 mg via INTRAMUSCULAR

## 2016-03-18 MED ORDER — KETOROLAC TROMETHAMINE 60 MG/2ML IM SOLN
60.0000 mg | Freq: Once | INTRAMUSCULAR | Status: AC
  Administered 2016-03-18: 60 mg via INTRAMUSCULAR

## 2016-03-18 MED ORDER — SUMATRIPTAN SUCCINATE 6 MG/0.5ML ~~LOC~~ SOLN
SUBCUTANEOUS | Status: AC
  Filled 2016-03-18: qty 0.5

## 2016-03-18 MED ORDER — SUMATRIPTAN SUCCINATE 6 MG/0.5ML ~~LOC~~ SOLN: 6.0000 mg | Freq: Once | SUBCUTANEOUS | Status: DC

## 2016-03-18 MED ORDER — SUMATRIPTAN SUCCINATE 6 MG/0.5ML ~~LOC~~ SOLN
6.0000 mg | Freq: Once | SUBCUTANEOUS | Status: AC
  Administered 2016-03-18: 6 mg via SUBCUTANEOUS

## 2016-03-18 MED ORDER — DEXAMETHASONE SODIUM PHOSPHATE 10 MG/ML IJ SOLN
10.0000 mg | Freq: Once | INTRAMUSCULAR | Status: AC
  Administered 2016-03-18: 10 mg via INTRAMUSCULAR

## 2016-03-18 MED ORDER — KETOROLAC TROMETHAMINE 60 MG/2ML IM SOLN
INTRAMUSCULAR | Status: AC
  Filled 2016-03-18: qty 2

## 2016-03-18 NOTE — ED Notes (Signed)
This nurse not notified of initial heart rate

## 2016-03-18 NOTE — ED Provider Notes (Signed)
CSN: [id number]     Arrival date & time 03/18/16  1508 History   None    Chief Complaint  Patient presents with  . Headache   (Consider location/radiation/quality/duration/timing/severity/associated sxs/prior Treatment)  HPI   Patient is a 40 year old female presenting today with complaints of a migraine that started on Saturday evening. Patient states she had recently lost her insurance and used to take Imitrex injections, however has not had any left and has been trying to treat at home with ibuprofen and rest. Patient states only other significant medical history is a herniated disc in her neck which can sometimes trigger headaches. Patient states she has appointment this Wednesday with neurologist for follow-up for neck pain.   Past Medical History  Diagnosis Date  . Asthma   . Depression   . Syncope   . PCOS (polycystic ovarian syndrome)     [facility]  . Anxiety   . Diabetes mellitus without complication ([place])   . Hypoglycemia   . Migraines     Dr.Paul Hassell Done in [place]  . Bulging lumbar disc   . Shoulder disorder   . GERD (gastroesophageal reflux disease)    Past Surgical History  Procedure Laterality Date  . None    . Dental surgery     Family History  Problem Relation Age of Onset  . Diabetes Mother   . Arthritis Mother   . Breast cancer Mother 58  . Hypertension Mother   . Diabetes Maternal Grandmother   . Arthritis Maternal Grandmother   . Diabetes Maternal Aunt   . Hyperlipidemia Paternal Aunt   . Hyperlipidemia Paternal Uncle   . Stroke Paternal Uncle   . Kidney disease Paternal Uncle   . Depression    . Breast cancer      maternal cousin  . Heart disease Paternal Uncle 64    Sudden death  . Hypertension Father   . Migraines Sister    Social History  Substance Use Topics  . Smoking status: Never Smoker   . Smokeless tobacco: Never Used  . Alcohol Use: No     Comment: social   OB History    Gravida Para Term Preterm AB TAB SAB Ectopic  Multiple Living   0              Review of Systems  Constitutional: Negative.  Negative for fever and fatigue.  Eyes: Positive for photophobia and visual disturbance.  Respiratory: Negative.  Negative for cough and shortness of breath.   Cardiovascular: Negative.  Negative for chest pain and leg swelling.  Gastrointestinal: Positive for nausea and vomiting. Negative for abdominal pain.  Endocrine: Negative.   Genitourinary: Negative.   Musculoskeletal: Positive for neck pain. Negative for gait problem and neck stiffness.  Skin: Negative.   Allergic/Immunologic: Negative.   Neurological: Positive for headaches. Negative for dizziness.  Hematological: Negative.   Psychiatric/Behavioral: Negative.     Allergies  Codeine; Flagyl; Hydrocodone; Oxycodone; and Tussionex pennkinetic er  Home Medications   Prior to Admission medications   Medication Sig Start Date End Date Taking? Authorizing Provider  albuterol (VENTOLIN HFA) 108 (90 BASE) MCG/ACT inhaler Inhale 2 puffs into the lungs every 6 (six) hours as needed for wheezing. 09/15/13 11/01/14  Rosalita Chessman Chase, DO  beclomethasone (QVAR) 40 MCG/ACT inhaler Inhale 2 puffs into the lungs 2 (two) times daily. 10/01/13 11/01/14  Midge Minium, MD  clidinium-chlordiazePOXIDE (LIBRAX) 5-2.5 MG per capsule Take 1 capsule by mouth 3 (three) times daily before  meals. 02/21/15   Irene Shipper, MD  cyclobenzaprine (FLEXERIL) 5 MG tablet Take 5 mg by mouth 3 (three) times daily as needed. For muscle spasms. 07/15/12   Historical Provider, MD  diclofenac (VOLTAREN) 75 MG EC tablet Take 75 mg by mouth daily. with food 09/21/14   Historical Provider, MD  gabapentin (NEURONTIN) 100 MG capsule Take 1 pill nightly at bedtime for 1 week, then 2 pills nightly for 1 week, then 3 pills each night thereafter. 03/07/15   Star Age, MD  LORazepam (ATIVAN) 1 MG tablet Take 1 tablet (1 mg total) by mouth at bedtime as needed for anxiety. 06/09/14   Rosalita Chessman  Chase, DO  naratriptan (AMERGE) 2.5 MG tablet take 1 pill for migraine as needed. Can take second pill after 2 hours, no more than 2 pills in 24 hours. No more than 3 pills a week. 03/07/15   Star Age, MD  omeprazole (PRILOSEC) 40 MG capsule TAKE 1 CAPSULE EVERY DAY 04/03/15   Alferd Apa Lowne Chase, DO  ondansetron (ZOFRAN) 4 MG tablet Take 1 tablet (4 mg total) by mouth 2 (two) times daily as needed for nausea or vomiting. 09/29/14   Star Age, MD  SUMAtriptan (IMITREX) 6 MG/0.5ML SOLN injection Inject 0.5 mLs (6 mg total) into the skin once. May repeat once after 2 hours if not effective. 03/18/16   Nehemiah Settle, NP   Meds Ordered and Administered this Visit   Medications  ketorolac (TORADOL) injection 60 mg (60 mg Intramuscular Given 03/18/16 1706)  metoCLOPramide (REGLAN) injection 5 mg (5 mg Intramuscular Given 03/18/16 1706)  dexamethasone (DECADRON) injection 10 mg (10 mg Intramuscular Given 03/18/16 1706)  SUMAtriptan (IMITREX) injection 6 mg (6 mg Subcutaneous Given 03/18/16 1705)    BP 111/63 mmHg  Pulse 125  Temp(Src) 98.4 F (36.9 C) (Oral)  Resp 16  SpO2 100%  LMP 02/25/2016 No data found.   Physical Exam  Constitutional: She is oriented to person, place, and time. She appears well-developed and well-nourished. No distress.  Patient appears to be acutely uncomfortable. Patient is resting with her eyes closed in a darkened examination room upon entry.  Eyes: Conjunctivae are normal. Pupils are equal, round, and reactive to light. Right eye exhibits no discharge. Left eye exhibits no discharge. No scleral icterus.  Neck: Normal range of motion. Neck supple. No thyromegaly present.  Negative for nuchal rigidity.  Cardiovascular: Normal rate, regular rhythm, normal heart sounds and intact distal pulses.  Exam reveals no gallop and no friction rub.   No murmur heard. Pulmonary/Chest: Effort normal and breath sounds normal. No respiratory distress. She has no wheezes. She has no  rales. She exhibits no tenderness.  Neurological: She is alert and oriented to person, place, and time. She displays normal reflexes. No cranial nerve deficit.  Cranial nerves II through XII grossly intact.  Skin: Skin is warm. She is diaphoretic. No erythema.  Nursing note and vitals reviewed.   ED Course  Procedures (including critical care time)  Labs Review Labs Reviewed - No data to display  Imaging Review No results found.  The patient seems to be resting more comfortably in a darkened room following medication administration.  MDM   1. Intractable migraine with status migrainosus, unspecified migraine type    Meds ordered this encounter  Medications  . ketorolac (TORADOL) injection 60 mg    Sig:   . metoCLOPramide (REGLAN) injection 5 mg    Sig:   . dexamethasone (DECADRON) injection 10  mg    Sig:   . SUMAtriptan (IMITREX) injection 6 mg    Sig:   . SUMAtriptan (IMITREX) 6 MG/0.5ML SOLN injection    Sig: Inject 0.5 mLs (6 mg total) into the skin once. May repeat once after 2 hours if not effective.    Dispense:  4 vial    Refill:  1   Plan of care discussed with patient and she verbalizes understanding.      Nehemiah Settle, NP 03/18/16 1728

## 2016-03-18 NOTE — Discharge Instructions (Signed)
Recurrent Migraine Headache A migraine headache is an intense, throbbing pain on one or both sides of your head. Recurrent migraines keep coming back. A migraine can last for 30 minutes to several hours. CAUSES  The exact cause of a migraine headache is not always known. However, a migraine may be caused when nerves in the brain become irritated and release chemicals that cause inflammation. This causes pain. Certain things may also trigger migraines, such as:   Alcohol.  Smoking.  Stress.  Menstruation.  Aged cheeses.  Foods or drinks that contain nitrates, glutamate, aspartame, or tyramine.  Lack of sleep.  Chocolate.  Caffeine.  Hunger.  Physical exertion.  Fatigue.  Medicines used to treat chest pain (nitroglycerine), birth control pills, estrogen, and some blood pressure medicines. SYMPTOMS   Pain on one or both sides of your head.  Pulsating or throbbing pain.  Severe pain that prevents daily activities.  Pain that is aggravated by any physical activity.  Nausea, vomiting, or both.  Dizziness.  Pain with exposure to bright lights, loud noises, or activity.  General sensitivity to bright lights, loud noises, or smells. Before you get a migraine, you may get warning signs that a migraine is coming (aura). An aura may include:  Seeing flashing lights.  Seeing bright spots, halos, or zigzag lines.  Having tunnel vision or blurred vision.  Having feelings of numbness or tingling.  Having trouble talking.  Having muscle weakness. DIAGNOSIS  A recurrent migraine headache is often diagnosed based on:  Symptoms.  Physical examination.  A CT scan or MRI of your head. These imaging tests cannot diagnose migraines but can help rule out other causes of headaches.  TREATMENT  Medicines may be given for pain and nausea. Medicines can also be given to help prevent recurrent migraines. HOME CARE INSTRUCTIONS  Only take over-the-counter or prescription  medicines for pain or discomfort as directed by your health care provider. The use of long-term narcotics is not recommended.  Lie down in a dark, quiet room when you have a migraine.  Keep a journal to find out what may trigger your migraine headaches. For example, write down:  What you eat and drink.  How much sleep you get.  Any change to your diet or medicines.  Limit alcohol consumption.  Quit smoking if you smoke.  Get 7-9 hours of sleep, or as recommended by your health care provider.  Limit stress.  Keep lights dim if bright lights bother you and make your migraines worse. SEEK MEDICAL CARE IF:   You do not get relief from the medicines given to you.  You have a recurrence of pain.  You have a fever. SEEK IMMEDIATE MEDICAL CARE IF:  Your migraine becomes severe.  You have a stiff neck.  You have loss of vision.  You have muscular weakness or loss of muscle control.  You start losing your balance or have trouble walking.  You feel faint or pass out.  You have severe symptoms that are different from your first symptoms. MAKE SURE YOU:   Understand these instructions.  Will watch your condition.  Will get help right away if you are not doing well or get worse.   This information is not intended to replace advice given to you by your health care provider. Make sure you discuss any questions you have with your health care provider.   Document Released: 05/28/2001 Document Revised: 09/23/2014 Document Reviewed: 05/10/2013 Elsevier Interactive Patient Education 2016 [employer] Inc[employer]  

## 2016-03-18 NOTE — ED Notes (Signed)
Headache and neck pain for 2 weeks.  History of the same.  Patient has not had insurance for a year and has not been able to follow up with neurology, nor refill Topamax or get imitrex at physicians office due to insurance lapse.  However, patient recently started working again and has insurance again.

## 2016-04-02 ENCOUNTER — Ambulatory Visit (INDEPENDENT_AMBULATORY_CARE_PROVIDER_SITE_OTHER): Payer: 59 | Admitting: Neurology

## 2016-04-02 ENCOUNTER — Encounter: Payer: Self-pay | Admitting: Neurology

## 2016-04-02 VITALS — BP 122/68 | HR 78 | Resp 16 | Ht 64.0 in | Wt 246.0 lb

## 2016-04-02 DIAGNOSIS — R351 Nocturia: Secondary | ICD-10-CM

## 2016-04-02 DIAGNOSIS — R51 Headache: Secondary | ICD-10-CM

## 2016-04-02 DIAGNOSIS — G4710 Hypersomnia, unspecified: Secondary | ICD-10-CM

## 2016-04-02 DIAGNOSIS — G43009 Migraine without aura, not intractable, without status migrainosus: Secondary | ICD-10-CM | POA: Diagnosis not present

## 2016-04-02 DIAGNOSIS — R0683 Snoring: Secondary | ICD-10-CM | POA: Diagnosis not present

## 2016-04-02 DIAGNOSIS — R519 Headache, unspecified: Secondary | ICD-10-CM

## 2016-04-02 MED ORDER — SUMATRIPTAN SUCCINATE 6 MG/0.5ML ~~LOC~~ SOLN: 6.0000 mg | Freq: Once | SUBCUTANEOUS | Status: DC

## 2016-04-02 MED ORDER — ONDANSETRON HCL 4 MG PO TABS: 4.0000 mg | ORAL_TABLET | Freq: Two times a day (BID) | ORAL | Status: DC | PRN

## 2016-04-02 MED ORDER — GABAPENTIN 100 MG PO CAPS: ORAL_CAPSULE | ORAL | Status: DC

## 2016-04-02 NOTE — Progress Notes (Signed)
Subjective:    Patient ID: Stacy Gentry is a 40 y.o. female.  HPI     Interim history:  Stacy Gentry is a 40 year old right-handed woman with an underlying medical history of diabetes, asthma and obesity who presents for follow-up consultation of her migraine headaches. The patient is unaccompanied today. Stacy Gentry presents after more than a year.  I last saw her on 03/07/2015, at which time Stacy Gentry reported doing a little better GI-wise. Her migraines are about the same, lasting 2-3 days at a time. Stacy Gentry recalled having tried amitriptyline in the past. Stacy Gentry was off Topamax and off of Trokendi XR. Stacy Gentry was taking Zofran as needed. I suggested that Stacy Gentry use naratriptan by mouth as needed or sumatriptan injections as needed. For prophylactic treatment of her migraines I suggested a trial of gabapentin starting with 100 mg at night with gradual titration to 300 mg each night.  Today, 04/02/2016: Stacy Gentry reports that Stacy Gentry could not tolerate the Trokendi. Stacy Gentry had been taking the gabapentin, but ran out of it. Of note, Stacy Gentry presented to urgent care on 03/18/2016 with worsening of her migraine headaches. Stacy Gentry was treated symptomatically with Toradol, Reglan, Decadron, Imitrex injection was given a prescription for Imitrex injection as well. I reviewed the urgent care note from 03/18/2016. Stacy Gentry had ESI under Dr. Marlou Sa recently in the last 2 weeks and feels, the neck pain also triggered her migraine. Stacy Gentry used her sumatriptan injection from the UC as well. Stacy Gentry recalls trying oral Imitrex in the distant past and does not think it helped. Of note, Stacy Gentry snores and feels tired, Stacy Gentry has gained weight in the last 3 years of about 30 lb. Stacy Gentry snores loudly and often wakes up with a dull achy, non-migraines HAs. Nocturia 2 times per night, has a paternal Uncle with OSA and a maternal aunt with OSA.Unfortunately, Stacy Gentry had been out of work and had lost insurance for a while, hence the delay in follow-up. Overall, Stacy Gentry had done quite well with  respect to her headaches but has noted a flareup in the past month or so.   Previously:   I first met her on 09/29/2014 at the request of her GYN, at which time the patient reported a long-standing history of migraines, particularly perimenstrual migraines. Stacy Gentry was on generic Topamax 75 mg at bedtime and I suggested Stacy Gentry change it to long-acting Trokendi XR 100 mg each night. I suggested Zofran for nausea and refilled her prescriptions for Imitrex injections as needed and Amerge PO as needed. Stacy Gentry called back in March reporting diarrhea and constipation and that Stacy Gentry had been advised by her primary care physician to stop the topiramate. Stacy Gentry had been referred to GI.   In the interim, Stacy Gentry was seen by Dr. Henrene Pastor in GI on 02/21/2015. I reviewed his office note. Stacy Gentry had symptoms consistent with reflux disease and was advised to take omeprazole daily. Stacy Gentry also had symptoms in keeping with irritable bowel syndrome and was prescribed Librax as needed. Stacy Gentry was advised to pursue weight loss.   Stacy Gentry has a long-standing history of migraines particularly perimenstrual migraines. For abortive treatment Stacy Gentry has been on naratriptan and Imitrex injections and for preventative treatment Stacy Gentry has been on generic Topamax, 75 mg each bedtime. Stacy Gentry also has as needed Flexeril and diclofenac. Stacy Gentry was last seen by you on 03/02/2014, at which time Stacy Gentry was asked to continue naratriptan 2.5 mg as needed during the week of her menstrual period, diclofenac 75 mg twice a day as needed, Imitrex 6 mg  injection as needed, Topamax 75 mg daily at bedtime. Stacy Gentry has been on Cambia in the past for as needed use.   Stacy Gentry has used Zofran as needed for nausea. Stacy Gentry has seen orthopedics for her right shoulder pain and neck pain. Stacy Gentry had an MRI cervical spine in November 2015, which I reviewed, which showed multilevel disc bulges. Stacy Gentry had some neural foraminal stenoses as well. This was a cervical spine without contrast from 07/31/2014: Straightened cervical  lordosis without acute osseous process. No malalignment. Multilevel cervical disc bulges/ protrusions resulting in mild canal stenosis at C4-5 and C6-7, minimal at C5-6. Mild to moderate C4-5 neural foraminal narrowing.   Small T1-2 central disc extrusion resulting in mild canal stenosis.   Stacy Gentry also had a right shoulder MRI on 09/17/2014, which showed: 1. Moderate rotator cuff tendinopathy/tendinosis with probable small interstitial tears. No partial or full-thickness tear. 2. Mild intra-articular tendinopathy involving the long head biceps tendon. 3. Mild to moderate AC joint arthropathy for age as discussed above. 4. Mild subacromial/subdeltoid bursitis. Stacy Gentry has seen Dr. Marlou Sa in orthopedics. Stacy Gentry had a cortisone injection in her right shoulder recently. Her migraine started when Stacy Gentry was in college. [facility]. Her current regimen works reasonably well for her but Stacy Gentry's has about 4 migraines a month usually one long episode of migraine right around her menstrual period. Stacy Gentry has a family history migraines and her younger sister and 2 maternal aunts. Stacy Gentry had a brain MRI in the past which Stacy Gentry reports was normal. Stacy Gentry had a sleep study some 2 or 3 years ago which Stacy Gentry reports excluded obstructive sleep apnea. Stacy Gentry denies significant visual aura but has had some blurry vision. Migraine is accompanied by nausea but typically no vomiting and photophobia and fullness in her ears. Stacy Gentry has seen an ENT in the past for her ear fullness and was given eardrops. Stacy Gentry had no hearing loss and denies any tinnitus. Stacy Gentry denies any neurological accompaniment with her migraines such as one-sided weakness, numbness, tingling or facial droop or slurring of speech. Stacy Gentry has no vertigo with her migraines.  Her Past Medical History Is Significant For: Past Medical History  Diagnosis Date  . Asthma   . Depression   . Syncope   . PCOS (polycystic ovarian syndrome)     [facility]  . Anxiety    . Diabetes mellitus without complication ([place])   . Hypoglycemia   . Migraines     Dr.Paul Hassell Done in [place]  . Bulging lumbar disc   . Shoulder disorder   . GERD (gastroesophageal reflux disease)     Her Past Surgical History Is Significant For: Past Surgical History  Procedure Laterality Date  . None    . Dental surgery      Her Family History Is Significant For: Family History  Problem Relation Age of Onset  . Diabetes Mother   . Arthritis Mother   . Breast cancer Mother 16  . Hypertension Mother   . Diabetes Maternal Grandmother   . Arthritis Maternal Grandmother   . Diabetes Maternal Aunt   . Hyperlipidemia Paternal Aunt   . Hyperlipidemia Paternal Uncle   . Stroke Paternal Uncle   . Kidney disease Paternal Uncle   . Depression    . Breast cancer      maternal cousin  . Heart disease Paternal Uncle 50    Sudden death  . Hypertension Father   . Migraines Sister     Her Social  History Is Significant For: Social History   Social History  . Marital Status: Single    Spouse Name: N/A  . Number of Children: 0  . Years of Education: Masters   Occupational History  . Clinical cytogeneticist for [employer] History Main Topics  . Smoking status: Never Smoker   . Smokeless tobacco: Never Used  . Alcohol Use: No     Comment: social  . Drug Use: No  . Sexual Activity: Not Asked   Other Topics Concern  . None   Social History Narrative   Lives alone.   Exercise--no   Consumes about 2 cups of sweet tea week, 1 ginger ale a day     Her Allergies Are:  Allergies  Allergen Reactions  . Codeine Hives, Shortness Of Breath and Itching  . Flagyl [Metronidazole] Nausea Only  . Hydrocodone Itching  . Oxycodone Itching  . Tussionex Pennkinetic Er [Hydrocod Polst-Cpm Polst Er]   :   Her Current Medications Are:  Outpatient Encounter Prescriptions as of 04/02/2016  Medication Sig  . clidinium-chlordiazePOXIDE (LIBRAX) 5-2.5 MG per capsule  Take 1 capsule by mouth 3 (three) times daily before meals.  . cyclobenzaprine (FLEXERIL) 5 MG tablet Take 5 mg by mouth 3 (three) times daily as needed. For muscle spasms.  . diclofenac (VOLTAREN) 75 MG EC tablet Take 75 mg by mouth daily. with food  . gabapentin (NEURONTIN) 100 MG capsule Take 1 pill nightly at bedtime for 1 week, then 2 pills nightly for 1 week, then 3 pills each night thereafter.  Marland Kitchen LORazepam (ATIVAN) 1 MG tablet Take 1 tablet (1 mg total) by mouth at bedtime as needed for anxiety.  . methocarbamol (ROBAXIN) 500 MG tablet TAKE 1 TABLET BY MOUTH EVERY 8 HOURS AS NEEDED FOR SPASMS  . naratriptan (AMERGE) 2.5 MG tablet take 1 pill for migraine as needed. Can take second pill after 2 hours, no more than 2 pills in 24 hours. No more than 3 pills a week.  Marland Kitchen omeprazole (PRILOSEC) 40 MG capsule TAKE 1 CAPSULE EVERY DAY  . ondansetron (ZOFRAN) 4 MG tablet Take 1 tablet (4 mg total) by mouth 2 (two) times daily as needed for nausea or vomiting.  . SUMAtriptan (IMITREX) 6 MG/0.5ML SOLN injection Inject 0.5 mLs (6 mg total) into the skin once. May repeat once after 2 hours if not effective.  . traMADol (ULTRAM) 50 MG tablet TAKE 1 TABLET BY MOUTH EVERY 8-12 HOURS AS NEEDED  . albuterol (VENTOLIN HFA) 108 (90 BASE) MCG/ACT inhaler Inhale 2 puffs into the lungs every 6 (six) hours as needed for wheezing.  . beclomethasone (QVAR) 40 MCG/ACT inhaler Inhale 2 puffs into the lungs 2 (two) times daily.   No facility-administered encounter medications on file as of 04/02/2016.  :  Review of Systems:  Out of a complete 14 point review of systems, all are reviewed and negative with the exception of these symptoms as listed below:   Review of Systems  Neurological:       Patient states that Stacy Gentry has had some headaches past couple of weeks. Had injection recently in neck for bulging disk.  Had to go to urgent care a couple of weeks ago due to a migraines that lasted 4 days.     Objective:   Neurologic Exam  Physical Exam Physical Examination:   Filed Vitals:   04/02/16 1309  BP: 122/68  Pulse: 78  Resp: 16   General Examination: The patient is  a very pleasant 40 y.o. female in no acute distress. Stacy Gentry appears well-developed and well-nourished and well groomed. Stacy Gentry is in good spirits today.   HEENT: Normocephalic, atraumatic, pupils are equal, round and reactive to light and accommodation. Extraocular tracking is good without limitation to gaze excursion or nystagmus noted. Normal smooth pursuit is noted. Hearing is grossly intact. Face is symmetric with normal facial animation and normal facial sensation. Speech is clear with no dysarthria noted. There is no hypophonia. There is no lip, neck/head, jaw or voice tremor. Neck is supple with full range of passive and active motion. There are no carotid bruits on auscultation. Oropharynx exam reveals: mild mouth dryness, good dental hygiene and moderate airway crowding, due to narrow airway entry, larger tongue and thicker soft palate. Neck circumference is 15 5/8 in. Mallampati is class II. Tongue protrudes centrally and palate elevates symmetrically.   Chest: Clear to auscultation without wheezing, rhonchi or crackles noted.  Heart: S1+S2+0, regular and normal without murmurs, rubs or gallops noted.   Abdomen: Soft, non-tender and non-distended with normal bowel sounds appreciated on auscultation.  Extremities: There is no pitting edema in the distal lower extremities bilaterally. Pedal pulses are intact.  Skin: Warm and dry without trophic changes noted. There are no varicose veins.  Musculoskeletal: exam reveals no obvious joint deformities, tenderness or joint swelling or erythema.   Neurologically:  Mental status: The patient is awake, alert and oriented in all 4 spheres. Her immediate and remote memory, attention, language skills and fund of knowledge are appropriate. There is no evidence of aphasia, agnosia, apraxia or  anomia. Speech is clear with normal prosody and enunciation. Thought process is linear. Mood is normal and affect is normal.  Cranial nerves II - XII are as described above under HEENT exam. In addition: shoulder shrug is normal with equal shoulder height noted. Motor exam: Normal bulk, strength and tone is noted. There is no drift, tremor or rebound. Romberg is negative. Reflexes are 2+ throughout.  toes are downgoing bilaterally. Fine motor skills and coordination: intact with normal finger taps, normal hand movements, normal rapid alternating patting, normal foot taps and normal foot agility.  Cerebellar testing: No dysmetria or intention tremor on finger to nose testing. Heel to shin is unremarkable bilaterally. There is no truncal or gait ataxia.  Sensory exam: intact to light touch in the upper and lower extremities.  Gait, station and balance: Stacy Gentry stands easily. No veering to one side is noted. No leaning to one side is noted. Posture is age-appropriate and stance is narrow based. Gait shows normal stride length and normal pace. No problems turning are noted. Stacy Gentry turns en bloc. Tandem walk is unremarkable.   Assessment and Plan:  In summary, Stacy Gentry is a very pleasant 40 year old female with an underlying medical history of diabetes, asthma, obesity, and recent diagnoses of reflux disease and irritable bowel syndrome, who presents for follow-up consultation of her long-standing history of migraines without aura, particularly perimenstrual migraines. Stacy Gentry was on Topamax for quite some time. I suggested a trial of Trokendi XR 100 mg each night but Stacy Gentry could not tolerate it. In the past Stacy Gentry may have tried amitriptyline for headache prevention. Symptoms started in her early 34s and Stacy Gentry has migraines without aura, associated with nausea, photophobia, and fullness reported in both the ears. Her physical exam Has been unremarkable. However, Stacy Gentry has had morning headaches and weight gain in the past  3 years of over 30 pounds. Stacy Gentry does snore  loudly and sometimes wakes herself up with snoring, reports morning headaches, nocturia and nonrestorative sleep, daytime somnolence. Stacy Gentry reports having had a sleep study about 3 or 4 years ago, which at that time was negative for sleep apnea, I think we need to recheck on that. To that end, I would like for her to come back for an overnight sleep test and consider CPAP therapy for sleep apnea if necessary. In addition, we will reinitiate gabapentin, starting with 100 mg strength at night with titration to up to 300 mg at night, I renewed a prescription for Zofran and Imitrex injections which have worked for her. Stacy Gentry had an MRI brain in the past which was normal per her report. When Stacy Gentry was on topiramate Stacy Gentry still had significant residual migraine headaches. Of note, her migraine triggers are weather changes, pressure changes in particular when Stacy Gentry travels by air, and her menstrual periods, as well as neck pain. Stacy Gentry recently received her fourth epidural steroid injection into her neck.  I again talked to her about the diagnosis of migraines, the prognosis and treatment options. We talked about medical treatments and non-pharmacological approaches. We talked about maintaining a healthy lifestyle in general. I encouraged the patient to eat healthy, exercise daily and keep well hydrated, to keep a scheduled bedtime and wake time routine, to not skip any meals and eat healthy snacks in between meals and to have protein with every meal.   I advised the patient about common headache triggers again today and Stacy Gentry is willing to come back for a sleep study and consider CPAP therapy. I answered all her questions today and Stacy Gentry was in agreement.  I spent 25 minutes in total face-to-face time with the patient, more than 50% of which was spent in counseling and coordination of care, reviewing test results, reviewing medication and discussing or reviewing the diagnosis of migraines,  OSA, the prognosis and treatment options.

## 2016-04-02 NOTE — Patient Instructions (Signed)
We will restart your gabapentin at 100 mg with titration to 300 mg nightly.  I renewed your prescription for imitrex injections and Zofran for nausea.  Based on your symptoms and your exam I believe you are at risk for obstructive sleep apnea or OSA, and I think we should proceed with a sleep study to determine whether you do or do not have OSA and how severe it is. If you have more than mild OSA, I want you to consider treatment with CPAP. Please remember, the risks and ramifications of moderate to severe obstructive sleep apnea or OSA are: Cardiovascular disease, including congestive heart failure, stroke, difficult to control hypertension, arrhythmias, and even type 2 diabetes has been linked to untreated OSA. Sleep apnea causes disruption of sleep and sleep deprivation in most cases, which, in turn, can cause recurrent headaches, problems with memory, mood, concentration, focus, and vigilance. Most people with untreated sleep apnea report excessive daytime sleepiness, which can affect their ability to drive. Please do not drive if you feel sleepy.   I will likely see you back after your sleep study to go over the test results and where to go from there. We will call you after your sleep study to advise about the results (most likely, you will hear from Beverlee Nims, my nurse) and to set up an appointment at the time, as necessary.    Our sleep lab administrative assistant, Arrie Aran will meet with you or call you to schedule your sleep study. If you don't hear back from her by next week please feel free to call her at [phone]. This is her direct line and please leave a message with your phone number to call back if you get the voicemail box. She will call back as soon as possible.

## 2016-05-02 ENCOUNTER — Telehealth: Payer: Self-pay | Admitting: Neurology

## 2016-05-02 DIAGNOSIS — R0683 Snoring: Secondary | ICD-10-CM

## 2016-05-02 DIAGNOSIS — G4710 Hypersomnia, unspecified: Secondary | ICD-10-CM

## 2016-05-02 NOTE — Telephone Encounter (Signed)
[employer] denied Split Sleep study, HST is covered without authorization

## 2016-05-02 NOTE — Telephone Encounter (Signed)
This patient's insurance denied an attended sleep study. I will order home sleep test.  

## 2016-05-07 ENCOUNTER — Ambulatory Visit (INDEPENDENT_AMBULATORY_CARE_PROVIDER_SITE_OTHER): Payer: 59 | Admitting: Family Medicine

## 2016-05-07 ENCOUNTER — Encounter: Payer: Self-pay | Admitting: Family Medicine

## 2016-05-07 ENCOUNTER — Ambulatory Visit (HOSPITAL_BASED_OUTPATIENT_CLINIC_OR_DEPARTMENT_OTHER)
Admission: RE | Admit: 2016-05-07 | Discharge: 2016-05-07 | Disposition: A | Discharge status: Home / Self Care | Payer: 59 | Source: Ambulatory Visit | Attending: Family Medicine | Admitting: Family Medicine

## 2016-05-07 VITALS — BP 122/80 | HR 121 | Temp 99.1°F | Wt 250.2 lb

## 2016-05-07 DIAGNOSIS — S90862A Insect bite (nonvenomous), left foot, initial encounter: Secondary | ICD-10-CM | POA: Diagnosis not present

## 2016-05-07 DIAGNOSIS — M79602 Pain in left arm: Secondary | ICD-10-CM

## 2016-05-07 DIAGNOSIS — W57XXXA Bitten or stung by nonvenomous insect and other nonvenomous arthropods, initial encounter: Secondary | ICD-10-CM

## 2016-05-07 DIAGNOSIS — M79605 Pain in left leg: Secondary | ICD-10-CM

## 2016-05-07 MED ORDER — MOMETASONE FUROATE 0.1 % EX CREA: 1.0000 "application " | TOPICAL_CREAM | Freq: Every day | CUTANEOUS | 0 refills | Status: DC

## 2016-05-07 NOTE — Progress Notes (Signed)
Pre visit review using our clinic review tool, if applicable. No additional management support is needed unless otherwise documented below in the visit note. 

## 2016-05-07 NOTE — Progress Notes (Signed)
Patient ID: Stacy Gentry, female    DOB: 12-13-1975  Age: 40 y.o. MRN: [id number]    Subjective:  Subjective  HPI Stacy Gentry presents for L leg pain behind knee x 2 weeks -- it hurts to stand after sitting -- laying in bed does not bother her.   Its the bending for a period of time and then flexing that hurts.  No known injury.  No cp, no sob.  She also c/o insect bite on L foot that itches and swollen.  She used benadryl cream for itching.    Review of Systems  Constitutional: Negative for activity change, appetite change, fatigue and unexpected weight change.  Respiratory: Negative for cough and shortness of breath.   Cardiovascular: Negative for chest pain and palpitations.  Musculoskeletal: Positive for gait problem.       Pain behind L knee  Skin:       Insect bite on L foot  Psychiatric/Behavioral: Negative for behavioral problems and dysphoric mood. The patient is not nervous/anxious.     History Past Medical History:  Diagnosis Date  . Anxiety   . Asthma   . Bulging lumbar disc   . Depression   . Diabetes mellitus without complication ([place])   . GERD (gastroesophageal reflux disease)   . Hypoglycemia   . Migraines    Dr.Paul Hassell Done in [place]  . PCOS (polycystic ovarian syndrome)    [facility]  . Shoulder disorder   . Syncope     She has a past surgical history that includes None and Dental surgery.   Her family history includes Arthritis in her maternal grandmother and mother; Breast cancer (age of onset: 53) in her mother; Diabetes in her maternal aunt, maternal grandmother, and mother; Heart disease (age of onset: 56) in her paternal uncle; Hyperlipidemia in her paternal aunt and paternal uncle; Hypertension in her father and mother; Kidney disease in her paternal uncle; Migraines in her sister; Stroke in her paternal uncle.She reports that she has never smoked. She has never used smokeless tobacco. She reports that she does not drink alcohol or use  drugs.  Current Outpatient Prescriptions on File Prior to Visit  Medication Sig Dispense Refill  . diclofenac (VOLTAREN) 75 MG EC tablet Take 75 mg by mouth daily. with food  0  . gabapentin (NEURONTIN) 100 MG capsule Take 1 pill nightly at bedtime for 3 days, then 2 pills nightly for 3 days, then 3 pills nightly thereafter. 270 capsule 3  . methocarbamol (ROBAXIN) 500 MG tablet TAKE 1 TABLET BY MOUTH EVERY 8 HOURS AS NEEDED FOR SPASMS  1  . omeprazole (PRILOSEC) 40 MG capsule TAKE 1 CAPSULE EVERY DAY 30 capsule 11  . ondansetron (ZOFRAN) 4 MG tablet Take 1 tablet (4 mg total) by mouth 2 (two) times daily as needed for nausea or vomiting. 20 tablet 3  . SUMAtriptan (IMITREX) 6 MG/0.5ML SOLN injection Inject 0.5 mLs (6 mg total) into the skin once. May repeat once after 2 hours if not effective. 9 vial 3  . albuterol (VENTOLIN HFA) 108 (90 BASE) MCG/ACT inhaler Inhale 2 puffs into the lungs every 6 (six) hours as needed for wheezing. 1 Inhaler 1  . beclomethasone (QVAR) 40 MCG/ACT inhaler Inhale 2 puffs into the lungs 2 (two) times daily. 1 Inhaler 6   No current facility-administered medications on file prior to visit.      Objective:  Objective  Physical Exam  Constitutional: She is oriented to person, place, and  time. She appears well-developed and well-nourished.  HENT:  Head: Normocephalic and atraumatic.  Eyes: Conjunctivae and EOM are normal.  Neck: Normal range of motion. Neck supple. No JVD present. Carotid bruit is not present. No thyromegaly present.  Cardiovascular: Normal rate, regular rhythm and normal heart sounds.   No murmur heard. Pulmonary/Chest: Effort normal and breath sounds normal. No respiratory distress. She has no wheezes. She has no rales. She exhibits no tenderness.  Musculoskeletal: She exhibits no edema.       Feet:  Neurological: She is alert and oriented to person, place, and time.  Psychiatric: She has a normal mood and affect.  Nursing note and vitals  reviewed.  BP 122/80 (BP Location: Left Arm, Patient Position: Sitting, Cuff Size: Large)   Pulse (!) 121   Temp 99.1 F (37.3 C) (Oral)   Wt 250 lb 3.2 oz (113.5 kg)   SpO2 97%   BMI 42.95 kg/m  Wt Readings from Last 3 Encounters:  05/07/16 250 lb 3.2 oz (113.5 kg)  04/02/16 246 lb (111.6 kg)  03/07/15 246 lb (111.6 kg)     Lab Results  Component Value Date   WBC 16.6 (H) 08/17/2014   HGB 12.5 08/17/2014   HCT 37.7 08/17/2014   PLT 337 08/17/2014   GLUCOSE 155 (H) 08/17/2014   ALT 10 03/07/2014   AST 22 03/07/2014   NA 139 08/17/2014   K 3.5 (L) 08/17/2014   CL 99 08/17/2014   CREATININE 0.87 08/17/2014   BUN 10 08/17/2014   CO2 23 08/17/2014   TSH 0.51 07/25/2011   HGBA1C 5.8 04/11/2011    No results found.   Assessment & Plan:  Plan  I have discontinued Ms. Compere's cyclobenzaprine, LORazepam, clidinium-chlordiazePOXIDE, naratriptan, and traMADol. I am also having her start on mometasone. Additionally, I am having her maintain her albuterol, beclomethasone, diclofenac, omeprazole, methocarbamol, SUMAtriptan, gabapentin, and ondansetron.  Meds ordered this encounter  Medications  . mometasone (ELOCON) 0.1 % cream    Sig: Apply 1 application topically daily.    Dispense:  45 g    Refill:  0    Problem List Items Addressed This Visit    None    Visit Diagnoses    Insect bite of left foot, initial encounter    -  Primary   Relevant Medications   mometasone (ELOCON) 0.1 % cream   Leg pain, posterior, left       Relevant Orders   [place] Extrem Low Left Comp      Follow-up: No Follow-up on file.  Ann Held, DO

## 2016-05-07 NOTE — Patient Instructions (Signed)
Insect Bite Mosquitoes, flies, fleas, bedbugs, and many other insects can bite. Insect bites are different from insect stings. A sting is when poison (venom) is injected into the skin. Insect bites can cause pain or itching for a few days, but they are usually not serious. Some insects can spread diseases to people through a bite. SYMPTOMS  Symptoms of an insect bite include:  Itching or pain in the bite area.  Redness and swelling in the bite area.  An open wound (skin ulcer). In many cases, symptoms last for 2-4 days.  DIAGNOSIS  This condition is usually diagnosed based on symptoms and a physical exam. TREATMENT  Treatment is usually not needed for an insect bite. Symptoms often go away on their own. Your health care provider may recommend creams or lotions to help reduce itching. Antibiotic medicines may be prescribed if the bite becomes infected. A tetanus shot may be given in some cases. If you develop an allergic reaction to an insect bite, your health care provider will prescribe medicines to treat the reaction (antihistamines). This is rare. HOME CARE INSTRUCTIONS  Do not scratch the bite area.  Keep the bite area clean and dry. Wash the bite area daily with soap and water as told by your health care provider.  If directed, applyice to the bite area.  Put ice in a plastic bag.  Place a towel between your skin and the bag.  Leave the ice on for 20 minutes, 2-3 times per day.  To help reduce itching and swelling, try applying a baking soda paste, cortisone cream, or calamine lotion to the bite area as told by your health care provider.  Apply or take over-the-counter and prescription medicines only as told by your health care provider.  If you were prescribed an antibiotic medicine, use it as told by your health care provider. Do not stop using the antibiotic even if your condition improves.  Keep all follow-up visits as told by your health care provider. This is  important. PREVENTION   Use insect repellent. The best insect repellents contain:  DEET, picaridin, oil of lemon eucalyptus (OLE), or IR3535.  Higher amounts of an active ingredient.  When you are outdoors, wear clothing that covers your arms and legs.  Avoid opening windows that do not have window screens. SEEK MEDICAL CARE IF:  You have increased redness, swelling, or pain in the bite area.  You have a fever. SEEK IMMEDIATE MEDICAL CARE IF:   You have joint pain.   You have fluid, blood, or pus coming from the bite area.  You have a headache or neck pain.  You have unusual weakness.  You have a rash.  You have chest pain or shortness of breath.  You have abdominal pain, nausea, or vomiting.  You feel unusually tired or sleepy.   This information is not intended to replace advice given to you by your health care provider. Make sure you discuss any questions you have with your health care provider.   Document Released: 10/10/2004 Document Revised: 05/24/2015 Document Reviewed: 01/18/2015 Elsevier Interactive Patient Education 2016 [employer]  

## 2016-05-08 ENCOUNTER — Other Ambulatory Visit: Payer: Self-pay

## 2016-05-08 MED ORDER — TRAMADOL HCL 50 MG PO TABS: 50.0000 mg | ORAL_TABLET | Freq: Four times a day (QID) | ORAL | 0 refills | Status: DC | PRN

## 2016-05-24 ENCOUNTER — Telehealth: Payer: Self-pay | Admitting: Family Medicine

## 2016-05-24 ENCOUNTER — Ambulatory Visit: Payer: 59 | Admitting: Family Medicine

## 2016-05-24 NOTE — Telephone Encounter (Signed)
Patient cancelled 4:15pm appointment today due to work conflict patient [facility] to Monday. Charge or no charge

## 2016-05-26 NOTE — Telephone Encounter (Signed)
No charge. 

## 2016-05-27 ENCOUNTER — Ambulatory Visit (HOSPITAL_BASED_OUTPATIENT_CLINIC_OR_DEPARTMENT_OTHER)
Admission: RE | Admit: 2016-05-27 | Discharge: 2016-05-27 | Disposition: A | Discharge status: Home / Self Care | Payer: 59 | Source: Ambulatory Visit | Attending: Family Medicine | Admitting: Family Medicine

## 2016-05-27 ENCOUNTER — Encounter: Payer: Self-pay | Admitting: Family Medicine

## 2016-05-27 ENCOUNTER — Ambulatory Visit (INDEPENDENT_AMBULATORY_CARE_PROVIDER_SITE_OTHER): Payer: 59 | Admitting: Family Medicine

## 2016-05-27 VITALS — BP 110/72 | HR 87 | Temp 99.4°F | Ht 64.0 in | Wt 250.0 lb

## 2016-05-27 DIAGNOSIS — M5440 Lumbago with sciatica, unspecified side: Secondary | ICD-10-CM | POA: Diagnosis not present

## 2016-05-27 DIAGNOSIS — M79605 Pain in left leg: Secondary | ICD-10-CM

## 2016-05-27 DIAGNOSIS — M79604 Pain in right leg: Secondary | ICD-10-CM

## 2016-05-27 DIAGNOSIS — M47896 Other spondylosis, lumbar region: Secondary | ICD-10-CM | POA: Diagnosis not present

## 2016-05-27 DIAGNOSIS — M545 Low back pain, unspecified: Secondary | ICD-10-CM

## 2016-05-27 MED ORDER — TRAMADOL HCL 50 MG PO TABS: 50.0000 mg | ORAL_TABLET | Freq: Three times a day (TID) | ORAL | 0 refills | Status: DC | PRN

## 2016-05-27 MED ORDER — CYCLOBENZAPRINE HCL 10 MG PO TABS: 10.0000 mg | ORAL_TABLET | Freq: Three times a day (TID) | ORAL | 0 refills | Status: DC | PRN

## 2016-05-27 NOTE — Patient Instructions (Signed)

## 2016-05-27 NOTE — Progress Notes (Signed)
Patient ID: Stacy Gentry, female    DOB: [birth year]  Age: 40 y.o. MRN: [id number]    Subjective:  Subjective  HPI Stacy Gentry presents for b/l leg pain.  It started in her L leg-- she was seen about 2 weeks ago.  Then it started in her R leg.  Pain is in knees down.   Pt is concerned about a pinched nerve in her  Back.   No known injury.     Review of Systems  Constitutional: Negative for activity change, appetite change, fatigue and unexpected weight change.  Respiratory: Negative for cough and shortness of breath.   Cardiovascular: Negative for chest pain and palpitations.  Musculoskeletal: Positive for back pain and gait problem. Negative for neck pain.  Psychiatric/Behavioral: Negative for behavioral problems and dysphoric mood. The patient is not nervous/anxious.   her legs sometimes feel like they are goint to give out on her.    History Past Medical History:  Diagnosis Date  . Anxiety   . Asthma   . Bulging lumbar disc   . Depression   . Diabetes mellitus without complication ([place])   . GERD (gastroesophageal reflux disease)   . Hypoglycemia   . Migraines    Dr.Paul Hassell Done in [place]  . PCOS (polycystic ovarian syndrome)    [facility]  . Shoulder disorder   . Syncope     She has a past surgical history that includes None and Dental surgery.   Her family history includes Arthritis in her maternal grandmother and mother; Breast cancer (age of onset: 19) in her mother; Diabetes in her maternal aunt, maternal grandmother, and mother; Heart disease (age of onset: 34) in her paternal uncle; Hyperlipidemia in her paternal aunt and paternal uncle; Hypertension in her father and mother; Kidney disease in her paternal uncle; Migraines in her sister; Stroke in her paternal uncle.She reports that she has never smoked. She has never used smokeless tobacco. She reports that she does not drink alcohol or use drugs.  Current Outpatient Prescriptions on File Prior to Visit    Medication Sig Dispense Refill  . albuterol (VENTOLIN HFA) 108 (90 BASE) MCG/ACT inhaler Inhale 2 puffs into the lungs every 6 (six) hours as needed for wheezing. 1 Inhaler 1  . beclomethasone (QVAR) 40 MCG/ACT inhaler Inhale 2 puffs into the lungs 2 (two) times daily. 1 Inhaler 6  . gabapentin (NEURONTIN) 100 MG capsule Take 1 pill nightly at bedtime for 3 days, then 2 pills nightly for 3 days, then 3 pills nightly thereafter. 270 capsule 3  . mometasone (ELOCON) 0.1 % cream Apply 1 application topically daily. 45 g 0  . omeprazole (PRILOSEC) 40 MG capsule TAKE 1 CAPSULE EVERY DAY 30 capsule 11  . ondansetron (ZOFRAN) 4 MG tablet Take 1 tablet (4 mg total) by mouth 2 (two) times daily as needed for nausea or vomiting. 20 tablet 3  . SUMAtriptan (IMITREX) 6 MG/0.5ML SOLN injection Inject 0.5 mLs (6 mg total) into the skin once. May repeat once after 2 hours if not effective. 9 vial 3   No current facility-administered medications on file prior to visit.      Objective:  Objective  Physical Exam  Constitutional: She is oriented to person, place, and time. She appears well-developed and well-nourished.  HENT:  Head: Normocephalic and atraumatic.  Eyes: Conjunctivae and EOM are normal.  Neck: Normal range of motion. Neck supple. No JVD present. Carotid bruit is not present. No thyromegaly present.  Cardiovascular: Normal  rate, regular rhythm and normal heart sounds.   No murmur heard. Pulmonary/Chest: Effort normal and breath sounds normal. No respiratory distress. She has no wheezes. She has no rales. She exhibits no tenderness.  Musculoskeletal: She exhibits tenderness. She exhibits no edema.       Arms: Neurological: She is alert and oriented to person, place, and time. She has normal reflexes. She displays normal reflexes. No cranial nerve deficit. She exhibits normal muscle tone. Coordination normal.  Slight weakness in L foot Otherwise strength is good.    Psychiatric: She has a  normal mood and affect.  Nursing note and vitals reviewed.  BP 110/72 (BP Location: Left Arm, Patient Position: Sitting, Cuff Size: Large)   Pulse 87   Temp 99.4 F (37.4 C) (Oral)   Ht 5\' 4"  (1.626 m)   Wt 250 lb (113.4 kg)   LMP 04/16/2016 (Approximate)   SpO2 100%   BMI 42.91 kg/m  Wt Readings from Last 3 Encounters:  05/27/16 250 lb (113.4 kg)  05/07/16 250 lb 3.2 oz (113.5 kg)  04/02/16 246 lb (111.6 kg)     Lab Results  Component Value Date   WBC 16.6 (H) 08/17/2014   HGB 12.5 08/17/2014   HCT 37.7 08/17/2014   PLT 337 08/17/2014   GLUCOSE 155 (H) 08/17/2014   ALT 10 03/07/2014   AST 22 03/07/2014   NA 139 08/17/2014   K 3.5 (L) 08/17/2014   CL 99 08/17/2014   CREATININE 0.87 08/17/2014   BUN 10 08/17/2014   CO2 23 08/17/2014   TSH 0.51 07/25/2011   HGBA1C 5.8 04/11/2011    US Venous Img Lower Unilateral Left  Result Date: 05/07/2016 CLINICAL DATA:  Left knee pain for 2 weeks EXAM: LEFT LOWER EXTREMITY VENOUS DUPLEX ULTRASOUND TECHNIQUE: Doppler venous assessment of the left lower extremity deep venous system was performed, including characterization of spectral flow, compressibility, and phasicity. COMPARISON:  None. FINDINGS: There is complete compressibility of the left common femoral, femoral, and popliteal veins. Doppler analysis demonstrates respiratory phasicity and augmentation of flow with calf compression. No obvious superficial vein or calf vein thrombosis. IMPRESSION: No evidence of left lower extremity DVT. Electronically Signed   By: Marybelle Killings M.D.   On: 05/07/2016 14:10     Assessment & Plan:  Plan  I have discontinued Stacy Gentry's diclofenac, methocarbamol, and traMADol. I am also having her start on traMADol and cyclobenzaprine. Additionally, I am having her maintain her albuterol, beclomethasone, omeprazole, SUMAtriptan, gabapentin, ondansetron, and mometasone.  Meds ordered this encounter  Medications  . traMADol (ULTRAM) 50 MG tablet      Sig: Take 1 tablet (50 mg total) by mouth every 8 (eight) hours as needed.    Dispense:  30 tablet    Refill:  0  . cyclobenzaprine (FLEXERIL) 10 MG tablet    Sig: Take 1 tablet (10 mg total) by mouth 3 (three) times daily as needed for muscle spasms.    Dispense:  30 tablet    Refill:  0    Problem List Items Addressed This Visit    None    Visit Diagnoses    Leg pain, bilateral    -  Primary   Relevant Medications   traMADol (ULTRAM) 50 MG tablet   cyclobenzaprine (FLEXERIL) 10 MG tablet   Other Relevant Orders   Ambulatory referral to Orthopedic Surgery   Low back pain with radiation, unspecified laterality       Relevant Medications   traMADol (ULTRAM) 50  MG tablet   cyclobenzaprine (FLEXERIL) 10 MG tablet   Other Relevant Orders   DG Lumbar Spine Complete (Completed)   Ambulatory referral to Orthopedic Surgery      Follow-up: Return if symptoms worsen or fail to improve.  Ann Held, DO

## 2016-05-27 NOTE — Progress Notes (Signed)
Pre visit review using our clinic review tool, if applicable. No additional management support is needed unless otherwise documented below in the visit note. 

## 2016-06-04 ENCOUNTER — Encounter (INDEPENDENT_AMBULATORY_CARE_PROVIDER_SITE_OTHER): Payer: 59 | Admitting: Neurology

## 2016-06-04 DIAGNOSIS — G4710 Hypersomnia, unspecified: Secondary | ICD-10-CM | POA: Diagnosis not present

## 2016-06-04 DIAGNOSIS — R0683 Snoring: Secondary | ICD-10-CM

## 2016-06-10 ENCOUNTER — Other Ambulatory Visit (INDEPENDENT_AMBULATORY_CARE_PROVIDER_SITE_OTHER): Payer: Self-pay | Admitting: Orthopedic Surgery

## 2016-06-10 DIAGNOSIS — M545 Low back pain: Secondary | ICD-10-CM

## 2016-06-18 ENCOUNTER — Telehealth: Payer: Self-pay | Admitting: Neurology

## 2016-06-18 DIAGNOSIS — G4733 Obstructive sleep apnea (adult) (pediatric): Secondary | ICD-10-CM

## 2016-06-18 NOTE — Telephone Encounter (Signed)
Patient seen on 04/02/16, HST on 06/04/16, which showed mild/borderline OSA. I recommend treatment for this in the form of autoPAP, which means, that we don't have to bring her back for a second sleep study with CPAP, but will let her try an autoPAP machine at home, through a DME company (of her choice, or as per insurance requirement). The DME representative will educate her on how to use the machine, how to put the mask on, etc. I have placed an order in the chart. Please send referral, talk to patient, send report to PCP and referring MD. We will need a FU in sleep clinic for 8 to 10 weeks post-PAP set up, please arrange that as well. Thanks,   Star Age, MD, PhD [facility])

## 2016-06-18 NOTE — Telephone Encounter (Signed)
I spoke to patient and she is aware of results and recommendations. She is willing to proceed with treatment. I will send orders to [facility]. I will send report to PCP. Patient will receive a letter reminding her to make f/u appt and stress the importance of compliance.

## 2016-06-21 ENCOUNTER — Ambulatory Visit
Admission: RE | Admit: 2016-06-21 | Discharge: 2016-06-21 | Disposition: A | Discharge status: Home / Self Care | Payer: 59 | Source: Ambulatory Visit | Attending: Orthopedic Surgery | Admitting: Orthopedic Surgery

## 2016-06-21 DIAGNOSIS — M545 Low back pain, unspecified: Secondary | ICD-10-CM

## 2016-07-03 ENCOUNTER — Ambulatory Visit (INDEPENDENT_AMBULATORY_CARE_PROVIDER_SITE_OTHER): Payer: 59 | Admitting: Orthopedic Surgery

## 2016-07-03 DIAGNOSIS — M79604 Pain in right leg: Secondary | ICD-10-CM | POA: Diagnosis not present

## 2016-08-12 ENCOUNTER — Encounter: Payer: Self-pay | Admitting: Family Medicine

## 2016-08-12 ENCOUNTER — Other Ambulatory Visit: Payer: Self-pay | Admitting: Family Medicine

## 2016-08-12 DIAGNOSIS — M79605 Pain in left leg: Secondary | ICD-10-CM

## 2016-08-12 DIAGNOSIS — M545 Low back pain, unspecified: Secondary | ICD-10-CM

## 2016-08-12 DIAGNOSIS — M79604 Pain in right leg: Secondary | ICD-10-CM

## 2016-08-12 DIAGNOSIS — M5440 Lumbago with sciatica, unspecified side: Principal | ICD-10-CM

## 2016-08-12 DIAGNOSIS — K649 Unspecified hemorrhoids: Secondary | ICD-10-CM

## 2016-08-12 MED ORDER — CYCLOBENZAPRINE HCL 10 MG PO TABS: 10.0000 mg | ORAL_TABLET | Freq: Three times a day (TID) | ORAL | 0 refills | Status: DC | PRN

## 2016-08-12 MED ORDER — HYDROCORTISONE ACETATE 25 MG RE SUPP: 25.0000 mg | Freq: Two times a day (BID) | RECTAL | 0 refills | Status: DC

## 2016-08-15 ENCOUNTER — Encounter: Payer: Self-pay | Admitting: Neurology

## 2016-08-15 ENCOUNTER — Ambulatory Visit (INDEPENDENT_AMBULATORY_CARE_PROVIDER_SITE_OTHER): Payer: 59 | Admitting: Neurology

## 2016-08-15 VITALS — BP 120/78 | HR 88 | Resp 16 | Ht 64.0 in | Wt 246.0 lb

## 2016-08-15 DIAGNOSIS — G43009 Migraine without aura, not intractable, without status migrainosus: Secondary | ICD-10-CM | POA: Diagnosis not present

## 2016-08-15 DIAGNOSIS — M542 Cervicalgia: Secondary | ICD-10-CM | POA: Diagnosis not present

## 2016-08-15 DIAGNOSIS — G4733 Obstructive sleep apnea (adult) (pediatric): Secondary | ICD-10-CM

## 2016-08-15 DIAGNOSIS — G8929 Other chronic pain: Secondary | ICD-10-CM | POA: Diagnosis not present

## 2016-08-15 MED ORDER — GABAPENTIN 400 MG PO CAPS: ORAL_CAPSULE | ORAL | 5 refills | Status: DC

## 2016-08-15 MED ORDER — SUMATRIPTAN SUCCINATE 6 MG/0.5ML ~~LOC~~ SOLN: 6.0000 mg | Freq: Once | SUBCUTANEOUS | 3 refills | Status: DC

## 2016-08-15 MED ORDER — ONDANSETRON HCL 4 MG PO TABS: 4.0000 mg | ORAL_TABLET | Freq: Two times a day (BID) | ORAL | 3 refills | Status: DC | PRN

## 2016-08-15 NOTE — Patient Instructions (Addendum)
Please continue using your autoPAP regularly, and as explained, particularly in the early morning hours as your sleep apnea may be worse during REM/dream sleep, which is more in the later part of the night. While your insurance requires that you use CPAP at least 4 hours each night on 70% of the nights, I recommend, that you not skip any nights and use it throughout the night if you can. Getting used to CPAP and staying with the treatment long term does take time and patience and discipline. Untreated obstructive sleep apnea when it is moderate to severe can have an adverse impact on cardiovascular health and raise her risk for heart disease, arrhythmias, hypertension, congestive heart failure, stroke and diabetes. Untreated obstructive sleep apnea causes sleep disruption, nonrestorative sleep, and sleep deprivation. This can have an impact on your day to day functioning and cause daytime sleepiness and impairment of cognitive function, memory loss, mood disturbance, and problems focussing. Using CPAP regularly can improve these symptoms.  Please remember, common headache triggers are: sleep deprivation, dehydration, overheating, stress, hypoglycemia or skipping meals and blood sugar fluctuations, excessive pain medications or excessive alcohol use or caffeine withdrawal. Some people have food triggers such as aged cheese, orange juice or chocolate, especially dark chocolate, or MSG (monosodium glutamate). Try to avoid these headache triggers as much possible. It may be helpful to keep a headache diary to figure out what makes your headaches worse or brings them on and what alleviates them. Some people report headache onset after exercise but studies have shown that regular exercise may actually prevent headaches from coming. If you have exercise-induced headaches, please make sure that you drink plenty of fluid before and after exercising and that you do not over do it and do not overheat.  We will continue  with Imitrex inj as needed.   We will increase your Gabapentin to 400 mg each night.   Please talk to Dr. Marlou Sa about doing PT for your neck pain as neck pain may also trigger migraines.   Please read the booklet on botox for migraines. We may consider botox inj in the future.

## 2016-08-15 NOTE — Progress Notes (Signed)
Subjective:    Patient ID: Stacy Gentry is a 40 y.o. female.  HPI     Interim history:   Stacy Gentry is a 40 year old right-handed woman with an underlying medical history of diabetes, asthma and obesity who presents for follow-up consultation of Stacy Gentry migraine headaches and sleep disordered breathing, after Stacy Gentry recent home sleep test. The patient is unaccompanied today. I last saw Stacy Gentry on 04/02/2016, at which time Stacy Gentry reported Stacy Gentry could not tolerate the Trokendi. Stacy Gentry had run out of the gabapentin. Stacy Gentry presented to urgent care on 03/18/2016 with worsening of Stacy Gentry migraine headaches. Stacy Gentry was treated symptomatically with Toradol, Reglan, Decadron, Imitrex injection was given a prescription for Imitrex injection as well. I reviewed the urgent care note from 03/18/2016. Stacy Gentry had ESI under Dr. Marlou Sa recently. Stacy Gentry used Stacy Gentry sumatriptan injection from the [facility] as well. Stacy Gentry had tried oral sumatriptan in the distant past and did not think it helped at the time. Since Stacy Gentry also noticed daytime tiredness and snoring and had interim weight gain I suggested we proceed with a sleep study. Stacy Gentry had a home sleep test on 06/04/2016. Stacy Gentry AHI was 5.3, borderline. Average oxygen saturation 94%, nadir was 84%. Based on Stacy Gentry symptoms and borderline sleep apnea findings I suggested a trial of AutoPap.   Today, 08/15/2016: I reviewed Stacy Gentry AutoPap compliance data from 07/15/2016 through 08/13/2016, which is a total of 30 days, during which time Stacy Gentry used Stacy Gentry machine 28 days with percent used days greater than 4 hours at 90%, indicating excellent compliance with an average usage of 5 hours and 45 minutes 4 days on treatment. Average AHI was 0.6 per hour, 95th percentile pressure at 8.3 cm, leak on the higher side, with the 95th percentile at 21.3 L/m, pressure setting of 4 cm to 12 cm with EPR.  Today, 08/15/2016: Stacy Gentry reports doing okay with AutoPap. Stacy Gentry headaches are not significantly improved. Stacy Gentry continues to take gabapentin, now  at 300 mg at night. Stacy Gentry also uses Imitrex injections as needed. Stacy Gentry migraines are most typically right around Stacy Gentry period and during the entire time of Stacy Gentry period. Stacy Gentry has not noticed any other triggers. Stacy Gentry tries to hydrate well. 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 Stacy Gentry has a tendency to take Stacy Gentry mask off in the early morning hours around 2 or 3 AM when Stacy Gentry goes to the bathroom.  Previously:    Stacy Gentry presents after more than a year. I saw Stacy Gentry on 03/07/2015, at which time Stacy Gentry reported doing a little better GI-wise. Stacy Gentry migraines are about the same, lasting 2-3 days at a time. Stacy Gentry recalled having tried amitriptyline in the past. Stacy Gentry was off Topamax and off of Trokendi XR. Stacy Gentry was taking Zofran as needed. I suggested that Stacy Gentry use naratriptan by mouth as needed or sumatriptan injections as needed. For prophylactic treatment of Stacy Gentry migraines I suggested a trial of gabapentin starting with 100 mg at night with gradual titration to 300 mg each night.   I first met Stacy Gentry on 09/29/2014 at the request of Stacy Gentry GYN, at which time the patient reported a long-standing history of migraines, particularly perimenstrual migraines. Stacy Gentry was on generic Topamax 75 mg at bedtime and I suggested Stacy Gentry change it to long-acting Trokendi XR 100 mg each night. I suggested Zofran for nausea and refilled Stacy Gentry prescriptions for Imitrex injections as needed and Amerge PO as needed. Stacy Gentry called back in March reporting diarrhea and constipation and that Stacy Gentry had been advised by Stacy Gentry primary care physician to stop  the topiramate. Stacy Gentry had been referred to GI.    In the interim, Stacy Gentry was seen by Dr. Henrene Pastor in GI on 02/21/2015. I reviewed his office note. Stacy Gentry had symptoms consistent with reflux disease and was advised to take omeprazole daily. Stacy Gentry also had symptoms in keeping with irritable bowel syndrome and was prescribed Librax as needed. Stacy Gentry was advised to pursue weight loss.    Stacy Gentry has a long-standing history of migraines particularly perimenstrual  migraines. For abortive treatment Stacy Gentry has been on naratriptan and Imitrex injections and for preventative treatment Stacy Gentry has been on generic Topamax, 75 mg each bedtime. Stacy Gentry also has as needed Flexeril and diclofenac. Stacy Gentry was last seen by you on 03/02/2014, at which time Stacy Gentry was asked to continue naratriptan 2.5 mg as needed during the week of Stacy Gentry menstrual period, diclofenac 75 mg twice a day as needed, Imitrex 6 mg injection as needed, Topamax 75 mg daily at bedtime. Stacy Gentry has been on Cambia in the past for as needed use.   Stacy Gentry has used Zofran as needed for nausea. Stacy Gentry has seen orthopedics for Stacy Gentry right shoulder pain and neck pain. Stacy Gentry had an MRI cervical spine in November 2015, which I reviewed, which showed multilevel disc bulges. Stacy Gentry had some neural foraminal stenoses as well. This was a cervical spine without contrast from 07/31/2014: Straightened cervical lordosis without acute osseous process. No malalignment. Multilevel cervical disc bulges/ protrusions resulting in mild canal stenosis at C4-5 and C6-7, minimal at C5-6. Mild to moderate C4-5 neural foraminal narrowing.   Small T1-2 central disc extrusion resulting in mild canal stenosis.   Stacy Gentry also had a right shoulder MRI on 09/17/2014, which showed: 1. Moderate rotator cuff tendinopathy/tendinosis with probable small interstitial tears. No partial or full-thickness tear. 2. Mild intra-articular tendinopathy involving the long head biceps tendon. 3. Mild to moderate AC joint arthropathy for age as discussed above. 4. Mild subacromial/subdeltoid bursitis. Stacy Gentry has seen Dr. Marlou Sa in orthopedics. Stacy Gentry had a cortisone injection in Stacy Gentry right shoulder recently. Stacy Gentry migraine started when Stacy Gentry was in college. Stacy G[facility]. Stacy Gentry current regimen works reasonably well for Stacy Gentry but Stacy Gentry's has about 4 migraines a month usually one long episode of migraine right around Stacy Gentry menstrual period. Stacy Gentry has a family history migraines and Stacy Gentry younger  sister and 2 maternal aunts. Stacy Gentry had a brain MRI in the past which Stacy Gentry reports was normal. Stacy Gentry had a sleep study some 2 or 3 years ago which Stacy Gentry reports excluded obstructive sleep apnea. Stacy Gentry denies significant visual aura but has had some blurry vision. Migraine is accompanied by nausea but typically no vomiting and photophobia and fullness in Stacy Gentry ears. Stacy Gentry has seen an ENT in the past for Stacy Gentry ear fullness and was given eardrops. Stacy Gentry had no hearing loss and denies any tinnitus. Stacy Gentry denies any neurological accompaniment with Stacy Gentry migraines such as one-sided weakness, numbness, tingling or facial droop or slurring of speech. Stacy Gentry has no vertigo with Stacy Gentry migraines.  Stacy Gentry Past Medical History Is Significant For: Past Medical History:  Diagnosis Date  . Anxiety   . Asthma   . Bulging lumbar disc   . Depression   . Diabetes mellitus without complication ([place])   . GERD (gastroesophageal reflux disease)   . Hypoglycemia   . Migraines    Dr.Paul Hassell Done in [place]  . PCOS (polycystic ovarian syndrome)    [facility]  . Shoulder disorder   . Syncope  Stacy Gentry Past Surgical History Is Significant For: Past Surgical History:  Procedure Laterality Date  . DENTAL SURGERY    . None      Stacy Gentry Family History Is Significant For: Family History  Problem Relation Age of Onset  . Diabetes Mother   . Arthritis Mother   . Breast cancer Mother 32  . Hypertension Mother   . Diabetes Maternal Grandmother   . Arthritis Maternal Grandmother   . Diabetes Maternal Aunt   . Hyperlipidemia Paternal Aunt   . Hyperlipidemia Paternal Uncle   . Stroke Paternal Uncle   . Kidney disease Paternal Uncle   . Depression    . Breast cancer      maternal cousin  . Heart disease Paternal Uncle 75    Sudden death  . Hypertension Father   . Migraines Sister     Stacy Gentry Social History Is Significant For: Social History   Social History  . Marital status: Single    Spouse name: N/A  . Number of children: 0  . Years  of education: Masters   Occupational History  . Clinical cytogeneticist for [employer]   Social History Main Topics  . Smoking status: Never Smoker  . Smokeless tobacco: Never Used  . Alcohol use No     Comment: social  . Drug use: No  . Sexual activity: Not Asked   Other Topics Concern  . None   Social History Narrative   Lives alone.   Exercise--no   Consumes about 2 cups of sweet tea week, 1 ginger ale a day     Stacy Gentry Allergies Are:  Allergies  Allergen Reactions  . Codeine Hives, Shortness Of Breath and Itching  . Flagyl [Metronidazole] Nausea Only  . Hydrocodone Itching  . Oxycodone Itching  . Tussionex Pennkinetic Er [Hydrocod Polst-Cpm Polst Er]   :   Stacy Gentry Current Medications Are:  Outpatient Encounter Prescriptions as of 08/15/2016  Medication Sig  . cyclobenzaprine (FLEXERIL) 10 MG tablet Take 1 tablet (10 mg total) by mouth 3 (three) times daily as needed for muscle spasms.  Marland Kitchen gabapentin (NEURONTIN) 100 MG capsule Take 1 pill nightly at bedtime for 3 days, then 2 pills nightly for 3 days, then 3 pills nightly thereafter.  . hydrocortisone (ANUSOL-HC) 25 MG suppository Place 1 suppository (25 mg total) rectally 2 (two) times daily.  . mometasone (ELOCON) 0.1 % cream Apply 1 application topically daily.  Marland Kitchen omeprazole (PRILOSEC) 40 MG capsule TAKE 1 CAPSULE EVERY DAY  . ondansetron (ZOFRAN) 4 MG tablet Take 1 tablet (4 mg total) by mouth 2 (two) times daily as needed for nausea or vomiting.  . SUMAtriptan (IMITREX) 6 MG/0.5ML SOLN injection Inject 0.5 mLs (6 mg total) into the skin once. May repeat once after 2 hours if not effective.  . traMADol (ULTRAM) 50 MG tablet Take 1 tablet (50 mg total) by mouth every 8 (eight) hours as needed.  Marland Kitchen albuterol (VENTOLIN HFA) 108 (90 BASE) MCG/ACT inhaler Inhale 2 puffs into the lungs every 6 (six) hours as needed for wheezing.  . beclomethasone (QVAR) 40 MCG/ACT inhaler Inhale 2 puffs into the  lungs 2 (two) times daily.   No facility-administered encounter medications on file as of 08/15/2016.   :  Review of Systems:  Out of a complete 14 point review of systems, all are reviewed and negative with the exception of these symptoms as listed below:  Review of Systems  Neurological:       Patient is  here for CPAP f/u. States that Stacy Gentry is doing ok with it.  Patient reports that Stacy Gentry migraines are worse.     Objective:  Neurologic Exam  Physical Exam Physical Examination:   Vitals:   08/15/16 0819  BP: 120/78  Pulse: 88  Resp: 16   General Examination: The patient is a very pleasant 40 y.o. female in no acute distress. Stacy Gentry appears well-developed and well-nourished and well groomed. Stacy Gentry is in good spirits today.   HEENT: Normocephalic, atraumatic, pupils are equal, round and reactive to light and accommodation. Extraocular tracking is good without limitation to gaze excursion or nystagmus noted. Normal smooth pursuit is noted. Hearing is grossly intact. Face is symmetric with normal facial animation and normal facial sensation. Speech is clear with no dysarthria noted. There is no hypophonia. There is no lip, neck/head, jaw or voice tremor. Neck is supple with full range of passive and active motion. There are no carotid bruits on auscultation. Oropharynx exam reveals: mild mouth dryness, good dental hygiene and moderate airway crowding, due to narrow airway entry, larger tongue and thicker soft palate.  Tongue protrudes centrally and palate elevates symmetrically.   Chest: Clear to auscultation without wheezing, rhonchi or crackles noted.  Heart: S1+S2+0, regular and normal without murmurs, rubs or gallops noted.   Abdomen: Soft, non-tender and non-distended with normal bowel sounds appreciated on auscultation.  Extremities: There is no pitting edema in the distal lower extremities bilaterally. Pedal pulses are intact.  Skin: Warm and dry without trophic changes noted. There  are no varicose veins.  Musculoskeletal: exam reveals no obvious joint deformities, tenderness or joint swelling or erythema.   Neurologically:  Mental status: The patient is awake, alert and oriented in all 4 spheres. Stacy Gentry immediate and remote memory, attention, language skills and fund of knowledge are appropriate. There is no evidence of aphasia, agnosia, apraxia or anomia. Speech is clear with normal prosody and enunciation. Thought process is linear. Mood is normal and affect is normal.  Cranial nerves II - XII are as described above under HEENT exam. In addition: shoulder shrug is normal with equal shoulder height noted. Motor exam: Normal bulk, strength and tone is noted. There is no drift, tremor or rebound. Romberg is negative. Reflexes are 2+ throughout. Fine motor skills and coordination: intact with normal finger taps, normal hand movements, normal rapid alternating patting, normal foot taps and normal foot agility.  Cerebellar testing: No dysmetria or intention tremor on finger to nose testing. Heel to shin is unremarkable bilaterally. There is no truncal or gait ataxia.  Sensory exam: intact to light touch in the upper and lower extremities.  Gait, station and balance: Stacy Gentry stands easily. No veering to one side is noted. No leaning to one side is noted. Posture is age-appropriate and stance is narrow based. Gait shows normal stride length and normal pace. No problems turning are noted. Tandem walk is unremarkable.   Assessment and Plan:  In summary, Bruce Mayers Brindisi is a very pleasant 40 year old female with an underlying medical history of diabetes, asthma, obesity, and recent diagnoses of reflux disease and irritable bowel syndrome, who presents for follow-up consultation of Stacy Gentry long-standing history of migraines without aura, particularly perimenstrual migraines. Stacy Gentry was on Topamax for quite some time in the past. I suggested a trial of Trokendi XR 100 mg each night but Stacy Gentry could not  tolerate it. In the past Stacy Gentry may have tried amitriptyline for headache prevention. Symptoms started in Stacy Gentry early 58s and Stacy Gentry has  migraines without aura, associated with nausea, photophobia, and fullness reported in both the ears. Stacy Gentry physical exam has been unremarkable and stable.Stacy Gentry restarted Stacy Gentry gabapentin in July 2017. Stacy Gentry also underwent a home sleep test in October 2017 which showed borderline obstructive sleep apnea and Stacy Gentry has started since then a trial of AutoPap therapy at home. Stacy Gentry is compliant with treatment with the exception that Stacy Gentry does not keep the mask on all night and has a tendency to discontinue AutoPap in the early morning hours. I explained to Stacy Gentry that more likely than not Stacy Gentry has worsening of Stacy Gentry obstructive sleep disordered breathing during the later part of the night because of REM related obstructive sleep apnea. For this, I suggested that Stacy Gentry try to be consistent with Stacy Gentry AutoPap usage, particularly in the early morning hours because there is a tendency to be worse with his sleep apnea during REM sleep which is more concentrated in the later part of the night. In addition, at this time we will increase Stacy Gentry gabapentin to 400 mg at night. Stacy Gentry has been in physical therapy through Stacy Gentry orthopedic doctor and I have encouraged Stacy Gentry to talk to him about potentially adding physical therapy for Stacy Gentry neck pain. Stacy Gentry had received an injection in Stacy Gentry neck back in July which was helpful. I placed a new prescription for gabapentin 400 mg strength at night and renewed  the prescriptions for Zofran and Imitrex injections which have worked for Stacy Gentry. Stacy Gentry had an MRI brain in the past which was normal per Stacy Gentry report. When Stacy Gentry was on topiramate Stacy Gentry still had significant residual migraine headaches. Of note, Stacy Gentry migraine triggers are weather changes, pressure changes in particular when Stacy Gentry travels by air, and Stacy Gentry menstrual periods, as well as neck pain. Stacy Gentry received a fourth epidural steroid injection into Stacy Gentry neck.   I again talked to Stacy Gentry about the diagnosis of migraines, the prognosis and treatment options. We talked about medical treatments and non-pharmacological approaches. We talked about maintaining a healthy lifestyle in general. I encouraged the patient to eat healthy, exercise daily and keep well hydrated, to keep a scheduled bedtime and wake time routine, to not skip any meals and eat healthy snacks in between meals and to have protein with every meal.   I advised the patient about common headache triggers again today and Stacy Gentry is willing to  continue with AutoPap therapy with more extended hours. Stacy Gentry is commended for Stacy Gentry compliance with positive airway pressure treatment. I provided information about migraine headaches and Botox injections, in the future we may consider Botox injections for resistant migraines. I asked Stacy Gentry to return in 3 months, sooner as needed. I answered all Stacy Gentry questions today and Stacy Gentry was in agreement.  I spent 25 minutes in total face-to-face time with the patient, more than 50% of which was spent in counseling and coordination of care, reviewing test results, reviewing medication and discussing or reviewing the diagnosis of migraines, OSA, the prognosis and treatment options.

## 2016-09-10 ENCOUNTER — Encounter: Payer: Self-pay | Admitting: Family Medicine

## 2016-09-12 ENCOUNTER — Ambulatory Visit (INDEPENDENT_AMBULATORY_CARE_PROVIDER_SITE_OTHER): Payer: 59 | Admitting: Family Medicine

## 2016-09-12 ENCOUNTER — Encounter: Payer: Self-pay | Admitting: Family Medicine

## 2016-09-12 VITALS — BP 120/64 | HR 90 | Temp 98.7°F | Ht 64.0 in | Wt 250.2 lb

## 2016-09-12 DIAGNOSIS — J069 Acute upper respiratory infection, unspecified: Secondary | ICD-10-CM | POA: Diagnosis not present

## 2016-09-12 DIAGNOSIS — B9789 Other viral agents as the cause of diseases classified elsewhere: Secondary | ICD-10-CM | POA: Diagnosis not present

## 2016-09-12 DIAGNOSIS — J4521 Mild intermittent asthma with (acute) exacerbation: Secondary | ICD-10-CM

## 2016-09-12 DIAGNOSIS — J683 Other acute and subacute respiratory conditions due to chemicals, gases, fumes and vapors: Secondary | ICD-10-CM

## 2016-09-12 MED ORDER — ALBUTEROL SULFATE HFA 108 (90 BASE) MCG/ACT IN AERS: 2.0000 | INHALATION_SPRAY | Freq: Four times a day (QID) | RESPIRATORY_TRACT | 0 refills | Status: DC | PRN

## 2016-09-12 MED ORDER — PREDNISONE 20 MG PO TABS
ORAL_TABLET | ORAL | 0 refills | Status: DC
Start: 2016-09-12 — End: 2017-01-31

## 2016-09-12 NOTE — Patient Instructions (Signed)
We are going to treat you for reactive airways with albuterol as needed and a short course of prednisone. Let me know if you are not getting better soon- Sooner if worse.  Ok to use your robitussin and other OTC meds as needed

## 2016-09-12 NOTE — Progress Notes (Signed)
Pre visit review using our clinic review tool, if applicable. No additional management support is needed unless otherwise documented below in the visit note. 

## 2016-09-12 NOTE — Progress Notes (Signed)
[place] at [facility] [address] [place], [place] [address] [phone]  Date:  09/12/2016   Name:  Stacy Gentry   DOB:  1976-03-10   MRN:  [id number]  PCP:  Ann Held, DO    Chief Complaint: Cough (c/o mostly dry cough. worse in the morning. declined flu vaccine. )   History of Present Illness:  Stacy Gentry is a 40 y.o. very pleasant female patient who presents with the following:  History of obesity, tachycardia, hypertension Here today with illness- she has noted symptoms since this past Sunday- today is Thursday. She had a scratchy throat, then a cough, and she notes PND.  She notes that she tends to get wheezing/ RAD more just when she gets sick or in the summer- ?allergies  She is not using quvar, she does not have albuterol The cough can be productive of yellow mucus in the am only She will have severe coughing spells at times No GI symptoms She does not have a ST any longer She has tried robitussin and chlorseptic cough drops She was exposed to illness when she went home for the holidays   LMP 12/16  BP Readings from Last 3 Encounters:  09/12/16 120/64  08/15/16 120/78  05/27/16 110/72   Pulse Readings from Last 3 Encounters:  09/12/16 (!) 104  08/15/16 88  05/27/16 87   She is not on BP medication at this time  She has history of hyperglycemia but not DM EKG 2015- QTc 481  Patient Active Problem List   Diagnosis Date Noted  . Acute sinusitis with symptoms > 10 days 04/07/2014  . Viral illness 01/19/2014  . Asthmatic bronchitis with acute exacerbation 10/01/2013  . Obesity (BMI 30-39.9) 09/15/2013  . Perianal abscess 07/02/2013  . Rectal pain 06/15/2013  . Perirectal abscess 06/14/2013  . Diarrhea 03/31/2012  . Unspecified asthma(493.90) 11/22/2011  . Hypertension 07/25/2011  . Tachycardia 07/25/2011  . Obesity 07/25/2011  . Dyspnea 07/25/2011  . PCOS (polycystic  ovarian syndrome) 04/11/2011  . Migraines 04/11/2011    Past Medical History:  Diagnosis Date  . Anxiety   . Asthma   . Bulging lumbar disc   . Depression   . Diabetes mellitus without complication ([place])   . GERD (gastroesophageal reflux disease)   . Hypoglycemia   . Migraines    Dr.Paul Hassell Done in [place]  . PCOS (polycystic ovarian syndrome)    [facility]  . Shoulder disorder   . Syncope     Past Surgical History:  Procedure Laterality Date  . DENTAL SURGERY    . None      Social History  Substance Use Topics  . Smoking status: Never Smoker  . Smokeless tobacco: Never Used  . Alcohol use No     Comment: social    Family History  Problem Relation Age of Onset  . Diabetes Mother   . Arthritis Mother   . Breast cancer Mother 59  . Hypertension Mother   . Diabetes Maternal Grandmother   . Arthritis Maternal Grandmother   . Diabetes Maternal Aunt   . Hyperlipidemia Paternal Aunt   . Hyperlipidemia Paternal Uncle   . Stroke Paternal Uncle   . Kidney disease Paternal Uncle   . Depression    . Breast cancer      maternal cousin  . Heart disease Paternal Uncle 84    Sudden death  . Hypertension Father   .  Migraines Sister     Allergies  Allergen Reactions  . Codeine Hives, Shortness Of Breath and Itching  . Flagyl [Metronidazole] Nausea Only  . Hydrocodone Itching  . Oxycodone Itching  . Tussionex Pennkinetic Er [Hydrocod Polst-Cpm Polst Er]     Medication list has been reviewed and updated.  Current Outpatient Prescriptions on File Prior to Visit  Medication Sig Dispense Refill  . gabapentin (NEURONTIN) 400 MG capsule Take 1 pill nightly at bedtime. 30 capsule 5  . hydrocortisone (ANUSOL-HC) 25 MG suppository Place 1 suppository (25 mg total) rectally 2 (two) times daily. 12 suppository 0  . mometasone (ELOCON) 0.1 % cream Apply 1 application topically daily. 45 g 0  . omeprazole (PRILOSEC) 40 MG capsule TAKE 1 CAPSULE EVERY DAY 30 capsule 11  .  ondansetron (ZOFRAN) 4 MG tablet Take 1 tablet (4 mg total) by mouth 2 (two) times daily as needed for nausea or vomiting. 20 tablet 3  . traMADol (ULTRAM) 50 MG tablet Take 1 tablet (50 mg total) by mouth every 8 (eight) hours as needed. 30 tablet 0  . beclomethasone (QVAR) 40 MCG/ACT inhaler Inhale 2 puffs into the lungs 2 (two) times daily. 1 Inhaler 6  . SUMAtriptan (IMITREX) 6 MG/0.5ML SOLN injection Inject 0.5 mLs (6 mg total) into the skin once. May repeat once after 2 hours if not effective. 9 vial 3   No current facility-administered medications on file prior to visit.     Review of Systems:  As per HPI- otherwise negative. No CP, no GI symptoms, no rash  Physical Examination: Vitals:   09/12/16 1654  BP: 120/64  Pulse: (!) 104  Temp: 98.7 F (37.1 C)   Vitals:   09/12/16 1654  Weight: 250 lb 3.2 oz (113.5 kg)  Height: 5\' 4"  (1.626 m)   Body mass index is 42.95 kg/m. Ideal Body Weight: Weight in (lb) to have BMI = 25: 145.3  GEN: WDWN, NAD, Non-toxic, A & O x 3, obese, looks well, coughing some in room HEENT: Atraumatic, Normocephalic. Neck supple. No masses, No LAD.  Bilateral TM wnl, oropharynx normal.  PEERL,EOMI.   Facial hirsutism  Ears and Nose: No external deformity. CV: RRR, No M/G/R. No JVD. No thrill. No extra heart sounds. PULM: CTA B, no wheezes, crackles, rhonchi. No retractions. No resp. distress. No accessory muscle use. EXTR: No c/c/e NEURO Normal gait.  PSYCH: Normally interactive. Conversant. Not depressed or anxious appearing.  Calm demeanor.    Assessment and Plan: Mild intermittent reactive airways dysfunction syndrome with acute exacerbation - Plan: albuterol (PROVENTIL HFA;VENTOLIN HFA) 108 (90 Base) MCG/ACT inhaler, predniSONE (DELTASONE) 20 MG tablet  Viral URI - Plan: albuterol (PROVENTIL HFA;VENTOLIN HFA) 108 (90 Base) MCG/ACT inhaler  Here today with illness- she has a history of RAD when she is ill She has used albuterol and  prednisone in the past and did well with it Meds ordered this encounter  Medications  . cyclobenzaprine (FLEXERIL) 10 MG tablet    Sig: Take 10 mg by mouth 3 (three) times daily as needed for muscle spasms.  Marland Kitchen albuterol (PROVENTIL HFA;VENTOLIN HFA) 108 (90 Base) MCG/ACT inhaler    Sig: Inhale 2 puffs into the lungs every 6 (six) hours as needed for wheezing or shortness of breath.    Dispense:  1 Inhaler    Refill:  0  . predniSONE (DELTASONE) 20 MG tablet    Sig: Take 2 pills a day for 3 days, then 1 pill a day for 3  days    Dispense:  9 tablet    Refill:  0   She will let me know if not feeling better in the next few days- Sooner if worse.    Signed Lamar Blinks, MD

## 2016-09-17 ENCOUNTER — Encounter: Payer: Self-pay | Admitting: Family Medicine

## 2016-09-17 MED ORDER — AMOXICILLIN-POT CLAVULANATE 875-125 MG PO TABS: 1.0000 | ORAL_TABLET | Freq: Two times a day (BID) | ORAL | 0 refills | Status: DC

## 2016-09-17 NOTE — Addendum Note (Signed)
Addended by: Lamar Blinks C on: 09/17/2016 02:21 PM   Modules accepted: Orders

## 2016-09-17 NOTE — Telephone Encounter (Signed)
Pt called in requesting msg be forwarded to Dr. Etter Sjogren for RX to be sent in for coughing. [id number] - [place], [place] - [address]  Pt having a lot of drainage.

## 2016-09-18 MED ORDER — BENZONATATE 100 MG PO CAPS: 100.0000 mg | ORAL_CAPSULE | Freq: Three times a day (TID) | ORAL | 1 refills | Status: DC | PRN

## 2016-09-18 NOTE — Addendum Note (Signed)
Addended by: Lamar Blinks C on: 09/18/2016 02:03 PM   Modules accepted: Orders

## 2016-09-19 ENCOUNTER — Other Ambulatory Visit: Payer: Self-pay | Admitting: Family Medicine

## 2016-09-19 DIAGNOSIS — R05 Cough: Secondary | ICD-10-CM

## 2016-09-19 DIAGNOSIS — R059 Cough, unspecified: Secondary | ICD-10-CM

## 2016-09-19 MED ORDER — BENZONATATE 100 MG PO CAPS: 100.0000 mg | ORAL_CAPSULE | Freq: Two times a day (BID) | ORAL | 0 refills | Status: DC | PRN

## 2016-11-18 ENCOUNTER — Encounter: Payer: Self-pay | Admitting: Neurology

## 2016-11-18 ENCOUNTER — Ambulatory Visit (INDEPENDENT_AMBULATORY_CARE_PROVIDER_SITE_OTHER): Payer: 59 | Admitting: Neurology

## 2016-11-18 VITALS — BP 114/72 | HR 80 | Resp 16 | Ht 64.0 in | Wt 244.0 lb

## 2016-11-18 DIAGNOSIS — G4733 Obstructive sleep apnea (adult) (pediatric): Secondary | ICD-10-CM | POA: Diagnosis not present

## 2016-11-18 DIAGNOSIS — G43829 Menstrual migraine, not intractable, without status migrainosus: Secondary | ICD-10-CM

## 2016-11-18 MED ORDER — NORTRIPTYLINE HCL 10 MG PO CAPS: ORAL_CAPSULE | ORAL | 5 refills | Status: DC

## 2016-11-18 MED ORDER — FROVATRIPTAN SUCCINATE 2.5 MG PO TABS: ORAL_TABLET | ORAL | 5 refills | Status: DC

## 2016-11-18 NOTE — Patient Instructions (Addendum)
Please continue using your CPAP regularly. While your insurance requires that you use CPAP at least 4 hours each night on 70% of the nights, I recommend, that you not skip any nights and use it throughout the night if you can. Getting used to CPAP and staying with the treatment long term does take time and patience and discipline. Untreated obstructive sleep apnea when it is moderate to severe can have an adverse impact on cardiovascular health and raise her risk for heart disease, arrhythmias, hypertension, congestive heart failure, stroke and diabetes. Untreated obstructive sleep apnea causes sleep disruption, nonrestorative sleep, and sleep deprivation. This can have an impact on your day to day functioning and cause daytime sleepiness and impairment of cognitive function, memory loss, mood disturbance, and problems focussing. Using CPAP regularly can improve these symptoms.  We will try for prevention of your migraines: Pamelor (generic name: nortriptyline), 10 mg: Take 1 pill daily at bedtime for one week, then 2 pills daily at bedtime for 1 week, then 3 pills each night thereafter. Common side effects reported are: mouth dryness, drowsiness, confusion, dizziness.   You can stop the gabapentin.   Please talk to your GYN about options to reduce your menstrual periods.   We will stop the Imitrex and start you on Frovatriptan, which is a longer acting triptan.   Please be aware that Frova or any other triptan and antidepressants, such as Prozac Lexapro Effexor or Paxil or any other SSRI or SNRI or Nortriptyline (pamelor, a TCA), together can sometimes cause a rare and potentially dangerous adverse reaction, called SEROTONIN SYNDROME: Symptoms of this condition include (but are not limited to):  Agitation or restlessness, confusion, rapid heart rate and high blood pressure, dilated pupils, loss of muscle coordination or twitching muscles, muscle rigidity/stiffness, sweating and/or flushing, diarrhea,  headache, shivering, goose bumps. If you have any of these symptoms you may have to stop the medication. Call your health care provider immediately.  Severe serotonin syndrome can be life-threatening emergency. Signs and symptoms of a severe reaction may include: high fever, seizures, irregular heartbeat, unconsciousness or altered level of awareness or personality changes.  If you have any of these new symptoms, call 911 or have someone take you to the emergency room.

## 2016-11-18 NOTE — Progress Notes (Signed)
Subjective:    Patient ID: Stacy Gentry is a 41 y.o. female.  HPI     Interim history:  [name] is a 41 year old right-handed woman with an underlying medical history of diabetes, asthma and obesity who presents for follow-up consultation of her migraine headaches and sleep disordered breathing. The patient is unaccompanied today. I last saw her on 08/15/2016, at which time she reported doing okay with AutoPap therapy. She had a home sleep test and we discussed results. She was on gabapentin but headaches were not significantly improved.  Today, 11/18/2016 (all dictated new, as well as above notes, some dictation done in note pad or Word, outside of chart, may appear as copied):   I reviewed her AutoPap compliance data from 10/15/2016 through 07/13/2017, which is a total of 30 days, during which time she used her AutoPap 28 days, with percent used days greater than 4 hours at 60%, indicating suboptimal compliance with an AHI of 0.5 per hour, leak low with the 95th percentile at 4.8 L/m, 95th percentile pressure at 9.9 cm, range of 4-12 cm. She reports that her headaches are not better. She does not believe that gabapentin and Imitrex helpful. She reports that her headaches are primarily around her menstrual periods, start before and last throughout, often 3 days in a row. We had talked about potentially utilizing Botox injections in the future. She is reluctant to consider this at this time. She may have tried amitriptyline in the past. She's not completely sure. She definitely tried generic Topamax and we tried brand-name Trokendi, she also tried [place]. Imitrex injections are helpful but do not last long enough. She does have significant nausea and takes Zofran as needed. She has received epidural steroid injections into her neck about 3-4 times over the past 3 years or so. Her last injection under Dr. Marlou Sa was in July 2017. She also reports low back pain. She may go back for an  appointment.  The patient's allergies, current medications, family history, past medical history, past social history, past surgical history and problem list were reviewed and updated as appropriate.   Previously (copied from previous notes for reference):   I reviewed her AutoPap compliance data from 07/15/2016 through 08/13/2016, which is a total of 30 days, during which time she used her machine 28 days with percent used days greater than 4 hours at 90%, indicating excellent compliance with an average usage of 5 hours and 45 minutes 4 days on treatment. Average AHI was 0.6 per hour, 95th percentile pressure at 8.3 cm, leak on the higher side, with the 95th percentile at 21.3 L/m, pressure setting of 4 cm to 12 cm with EPR.   She presents after more than a year. I saw her on 03/07/2015, at which time she reported doing a little better GI-wise. Her migraines are about the same, lasting 2-3 days at a time. She recalled having tried amitriptyline in the past. She was off Topamax and off of Trokendi XR. She was taking Zofran as needed. I suggested that she use naratriptan by mouth as needed or sumatriptan injections as needed. For prophylactic treatment of her migraines I suggested a trial of gabapentin starting with 100 mg at night with gradual titration to 300 mg each night.   I first met her on 09/29/2014 at the request of her GYN, at which time the patient reported a long-standing history of migraines, particularly perimenstrual migraines. She was on generic Topamax 75 mg at bedtime and I suggested she  change it to long-acting Trokendi XR 100 mg each night. I suggested Zofran for nausea and refilled her prescriptions for Imitrex injections as needed and Amerge PO as needed. She called back in March reporting diarrhea and constipation and that she had been advised by her primary care physician to stop the topiramate. She had been referred to GI.    In the interim, she was seen by Dr. Henrene Pastor in GI on  02/21/2015. I reviewed his office note. She had symptoms consistent with reflux disease and was advised to take omeprazole daily. She also had symptoms in keeping with irritable bowel syndrome and was prescribed Librax as needed. She was advised to pursue weight loss.    She has a long-standing history of migraines particularly perimenstrual migraines. For abortive treatment she has been on naratriptan and Imitrex injections and for preventative treatment she has been on generic Topamax, 75 mg each bedtime. She also has as needed Flexeril and diclofenac. She was last seen by you on 03/02/2014, at which time she was asked to continue naratriptan 2.5 mg as needed during the week of her menstrual period, diclofenac 75 mg twice a day as needed, Imitrex 6 mg injection as needed, Topamax 75 mg daily at bedtime. She has been on [place] in the past for as needed use.   She has used Zofran as needed for nausea. She has seen orthopedics for her right shoulder pain and neck pain. She had an MRI cervical spine in November 2015, which I reviewed, which showed multilevel disc bulges. She had some neural foraminal stenoses as well. This was a cervical spine without contrast from 07/31/2014: Straightened cervical lordosis without acute osseous process. No malalignment. Multilevel cervical disc bulges/ protrusions resulting in mild canal stenosis at C4-5 and C6-7, minimal at C5-6. Mild to moderate C4-5 neural foraminal narrowing.   Small T1-2 central disc extrusion resulting in mild canal stenosis.   She also had a right shoulder MRI on 09/17/2014, which showed: 1. Moderate rotator cuff tendinopathy/tendinosis with probable small interstitial tears. No partial or full-thickness tear. 2. Mild intra-articular tendinopathy involving the long head biceps tendon. 3. Mild to moderate AC joint arthropathy for age as discussed above. 4. Mild subacromial/subdeltoid bursitis. She has seen Dr. Marlou Sa in orthopedics. She had a cortisone  injection in her right shoulder recently. Her migraine started when she was in college. [facility]. Her current regimen works reasonably well for her but she's has about 4 migraines a month usually one long episode of migraine right around her menstrual period. She has a family history migraines and her younger sister and 2 maternal aunts. She had a brain MRI in the past which she reports was normal. She had a sleep study some 2 or 3 years ago which she reports excluded obstructive sleep apnea. She denies significant visual aura but has had some blurry vision. Migraine is accompanied by nausea but typically no vomiting and photophobia and fullness in her ears. She has seen an ENT in the past for her ear fullness and was given eardrops. She had no hearing loss and denies any tinnitus. She denies any neurological accompaniment with her migraines such as one-sided weakness, numbness, tingling or facial droop or slurring of speech. She has no vertigo with her migraines.  Her Past Medical History Is Significant For: Past Medical History:  Diagnosis Date  . Anxiety   . Asthma   . Bulging lumbar disc   . Depression   .  Diabetes mellitus without complication ([place])   . GERD (gastroesophageal reflux disease)   . Hypoglycemia   . Migraines    Dr.Paul Hassell Done in [place]  . PCOS (polycystic ovarian syndrome)    [facility]  . Shoulder disorder   . Syncope     Her Past Surgical History Is Significant For: Past Surgical History:  Procedure Laterality Date  . DENTAL SURGERY    . None      Her Family History Is Significant For: Family History  Problem Relation Age of Onset  . Diabetes Mother   . Arthritis Mother   . Breast cancer Mother 67  . Hypertension Mother   . Diabetes Maternal Grandmother   . Arthritis Maternal Grandmother   . Diabetes Maternal Aunt   . Hyperlipidemia Paternal Aunt   . Hyperlipidemia Paternal Uncle   . Stroke Paternal Uncle   .  Kidney disease Paternal Uncle   . Depression    . Breast cancer      maternal cousin  . Heart disease Paternal Uncle 39    Sudden death  . Hypertension Father   . Migraines Sister     Her Social History Is Significant For: Social History   Social History  . Marital status: Single    Spouse name: N/A  . Number of children: 0  . Years of education: Masters   Occupational History  . Clinical cytogeneticist for [employer]   Social History Main Topics  . Smoking status: Never Smoker  . Smokeless tobacco: Never Used  . Alcohol use No     Comment: social  . Drug use: No  . Sexual activity: Not Asked   Other Topics Concern  . None   Social History Narrative   Lives alone.   Exercise--no   Consumes about 2 cups of sweet tea week, 1 ginger ale a day     Her Allergies Are:  Allergies  Allergen Reactions  . Codeine Hives, Shortness Of Breath and Itching  . Flagyl [Metronidazole] Nausea Only  . Hydrocodone Itching  . Oxycodone Itching  . Tussionex Pennkinetic Er [Hydrocod Polst-Cpm Polst Er]   :   Her Current Medications Are:  Outpatient Encounter Prescriptions as of 11/18/2016  Medication Sig  . albuterol (PROVENTIL HFA;VENTOLIN HFA) 108 (90 Base) MCG/ACT inhaler Inhale 2 puffs into the lungs every 6 (six) hours as needed for wheezing or shortness of breath.  . cyclobenzaprine (FLEXERIL) 10 MG tablet Take 10 mg by mouth 3 (three) times daily as needed for muscle spasms.  Marland Kitchen gabapentin (NEURONTIN) 400 MG capsule Take 1 pill nightly at bedtime.  Marland Kitchen omeprazole (PRILOSEC) 40 MG capsule TAKE 1 CAPSULE EVERY DAY  . ondansetron (ZOFRAN) 4 MG tablet Take 1 tablet (4 mg total) by mouth 2 (two) times daily as needed for nausea or vomiting.  . predniSONE (DELTASONE) 20 MG tablet Take 2 pills a day for 3 days, then 1 pill a day for 3 days  . traMADol (ULTRAM) 50 MG tablet Take 1 tablet (50 mg total) by mouth every 8 (eight) hours as needed.  .  beclomethasone (QVAR) 40 MCG/ACT inhaler Inhale 2 puffs into the lungs 2 (two) times daily.  . SUMAtriptan (IMITREX) 6 MG/0.5ML SOLN injection Inject 0.5 mLs (6 mg total) into the skin once. May repeat once after 2 hours if not effective.  . [DISCONTINUED] amoxicillin-clavulanate (AUGMENTIN) 875-125 MG tablet Take 1 tablet by mouth 2 (two) times daily.  . [DISCONTINUED] benzonatate (TESSALON) 100 MG capsule  Take 1 capsule (100 mg total) by mouth 3 (three) times daily as needed for cough.  . [DISCONTINUED] benzonatate (TESSALON) 100 MG capsule Take 1 capsule (100 mg total) by mouth 2 (two) times daily as needed for cough.  . [DISCONTINUED] hydrocortisone (ANUSOL-HC) 25 MG suppository Place 1 suppository (25 mg total) rectally 2 (two) times daily.  . [DISCONTINUED] mometasone (ELOCON) 0.1 % cream Apply 1 application topically daily.   No facility-administered encounter medications on file as of 11/18/2016.   :  Review of Systems:  Out of a complete 14 point review of systems, all are reviewed and negative with the exception of these symptoms as listed below: Review of Systems  Neurological:       Patient states that the duration of her migraines is worse. Does not feel that the Gabapentin and Imitrex injections are helping.     Objective:  Neurologic Exam  Physical Exam Physical Examination:   Vitals:   11/18/16 0828  BP: 114/72  Pulse: 80  Resp: 16    General Examination: The patient is a very pleasant 41 y.o. female in no acute distress. She appears well-developed and well-nourished and very well groomed.   HEENT: Normocephalic, atraumatic, pupils are equal, round and reactive to light and accommodation. Extraocular tracking is good without limitation to gaze excursion or nystagmus noted. Normal smooth pursuit is noted. Hearing is grossly intact. Face is symmetric with normal facial animation and normal facial sensation. Speech is clear with no dysarthria noted. There is no hypophonia.  There is no lip, neck/head, jaw or voice tremor. Neck is supple with full range of passive and active motion. There are no carotid bruits on auscultation. Oropharynx exam reveals: moderate mouth dryness, good dental hygiene and moderate airway crowding. Mallampati is class II. Tongue protrudes centrally and palate elevates symmetrically.   Chest: Clear to auscultation without wheezing, rhonchi or crackles noted.  Heart: S1+S2+0, regular and normal without murmurs, rubs or gallops noted.   Abdomen: Soft, non-tender and non-distended with normal bowel sounds appreciated on auscultation.  Extremities: There is no pitting edema in the distal lower extremities bilaterally. Pedal pulses are intact.  Skin: Warm and dry without trophic changes noted.  Musculoskeletal: exam reveals no obvious joint deformities, tenderness or joint swelling or erythema.   Neurologically:  Mental status: The patient is awake, alert and oriented in all 4 spheres. Her immediate and remote memory, attention, language skills and fund of knowledge are appropriate. There is no evidence of aphasia, agnosia, apraxia or anomia. Speech is clear with normal prosody and enunciation. Thought process is linear. Mood is normal and affect is normal.  Cranial nerves II - XII are as described above under HEENT exam. In addition: shoulder shrug is normal with equal shoulder height noted. Motor exam: Normal bulk, strength and tone is noted. There is no drift, tremor or rebound. Romberg is negative. Reflexes are 2+ throughout. Fine motor skills and coordination: intact with normal finger taps, normal hand movements, normal rapid alternating patting, normal foot taps and normal foot agility.  Cerebellar testing: No dysmetria or intention tremor on finger to nose testing. Heel to shin is unremarkable bilaterally. There is no truncal or gait ataxia.  Sensory exam: intact to light touch in the upper and lower extremities.  Gait, station and  balance: She stands easily. No veering to one side is noted. No leaning to one side is noted. Posture is age-appropriate and stance is narrow based. Gait shows normal stride length and normal pace. No  problems turning are noted. Tandem walk is unremarkable.         Assessment and Plan:  In summary, Zakhia M Stansel is a very pleasant 41 y.o.-year old female with An underlying medical history of asthma, morbid obesity, and diabetes, recent diagnosis of reflux disease and irritable bowel syndrome, who presents for follow-up consultation of her long-standing history of migraines without aura, especially menstrual migraines. She had tried trokendi XR in the recent past and in the distant past she had tried generic Topamax for a while. She may have tried amitriptyline in the past as well. Symptoms started in her 49s. She has associated nausea, photophobia and also fullness reported in her ears at the time of her migraines. Physical exam and neurological exam remained nonfocal and stable. Gabapentin has not been helpful recently and she has also been on Imitrex injections for abortive treatment which has not been helpful. She has been on AutoPap since her home sleep test in October 2017 which showed very mild obstructive sleep apnea and I suggested treatment in the hopes that her migraines improved, she has not found a telltale response to that as well. At this juncture, she is advised to talk to her GYN about possibilities to delay her menstrual periods, from my end of things I suggested we switch her Imitrex to frovatriptan and prevention medication from gabapentin to nortriptyline. She is advised about the potential very rare serotonin syndrome as a hypothetical reaction. She is in addition advised to try to continue with AutoPap therapy, she believes that perhaps her sleep is a little better consolidated. She has been working on weight loss and is commended for this. She is advised to follow-up with [place] in a  couple of months and see one of our nurse practitioners for checkup and see how things are going with the frovatriptan in lieu of Imitrex and the nortriptyline in lieu of gabapentin. She is advised to call us with any interim questions or concerns. Answered all her questions today and she was in agreement with the plan.  I spent 25 minutes in total face-to-face time with the patient, more than 50% of which was spent in counseling and coordination of care, reviewing test results, reviewing medication and discussing or reviewing the diagnosis of Menstrual migraines, OSA, its prognosis and treatment options. Pertinent laboratory and imaging test results that were available during this visit with the patient were reviewed by me and considered in my medical decision making (see chart for details).

## 2017-01-22 ENCOUNTER — Ambulatory Visit: Payer: 59 | Admitting: Adult Health

## 2017-01-31 ENCOUNTER — Ambulatory Visit (INDEPENDENT_AMBULATORY_CARE_PROVIDER_SITE_OTHER): Payer: 59 | Admitting: Family Medicine

## 2017-01-31 ENCOUNTER — Encounter: Payer: Self-pay | Admitting: Family Medicine

## 2017-01-31 VITALS — BP 116/76 | HR 115 | Temp 98.9°F | Resp 16 | Ht 64.0 in | Wt 251.2 lb

## 2017-01-31 DIAGNOSIS — R350 Frequency of micturition: Secondary | ICD-10-CM | POA: Diagnosis not present

## 2017-01-31 LAB — POC URINALSYSI DIPSTICK (AUTOMATED)
BILIRUBIN UA: NEGATIVE
Blood, UA: NEGATIVE
GLUCOSE UA: NEGATIVE
Ketones, UA: NEGATIVE
NITRITE UA: NEGATIVE
Protein, UA: NEGATIVE
Spec Grav, UA: 1.01 (ref 1.010–1.025)
UROBILINOGEN UA: 0.2 U/dL
pH, UA: 7.5 (ref 5.0–8.0)

## 2017-01-31 MED ORDER — CIPROFLOXACIN HCL 250 MG PO TABS: 250.0000 mg | ORAL_TABLET | Freq: Two times a day (BID) | ORAL | 0 refills | Status: DC

## 2017-01-31 NOTE — Patient Instructions (Signed)

## 2017-01-31 NOTE — Progress Notes (Signed)
Patient ID: Stacy Gentry, female   DOB: Sep 19, 1975, 41 y.o.   MRN: [id number]    Subjective:  I acted as a Education administrator for Dr. Carollee Herter.  Guerry Bruin, [place]   Patient ID: Stacy Gentry, female    DOB: 1976-02-27, 41 y.o.   MRN: [id number]  Chief Complaint  Patient presents with  . Urinary Frequency    bladder pressure and flank pain    Urinary Frequency   This is a new problem. Episode onset: Monday. The patient is experiencing no pain. There has been no fever. Associated symptoms include flank pain and frequency. Pertinent negatives include no hematuria or vomiting. Associated symptoms comments: Bladder pressure . Treatments tried: AZO. The treatment provided mild relief.    Patient is in today for urinary frequency and pressure   Patient Care Team: Carollee Herter, Alferd Apa, DO as PCP - General (Family Medicine)   Past Medical History:  Diagnosis Date  . Anxiety   . Asthma   . Bulging lumbar disc   . Depression   . Diabetes mellitus without complication ([place])   . GERD (gastroesophageal reflux disease)   . Hypoglycemia   . Migraines    Dr.Paul Hassell Done in [place]  . PCOS (polycystic ovarian syndrome)    [facility]  . Shoulder disorder   . Syncope     Past Surgical History:  Procedure Laterality Date  . DENTAL SURGERY    . None      Family History  Problem Relation Age of Onset  . Diabetes Mother   . Arthritis Mother   . Breast cancer Mother 52  . Hypertension Mother   . Diabetes Maternal Grandmother   . Arthritis Maternal Grandmother   . Diabetes Maternal Aunt   . Hyperlipidemia Paternal Aunt   . Hyperlipidemia Paternal Uncle   . Stroke Paternal Uncle   . Kidney disease Paternal Uncle   . Depression Unknown   . Breast cancer Unknown        maternal cousin  . Heart disease Paternal Uncle 75       Sudden death  . Hypertension Father   . Migraines Sister     Social History   Social History  . Marital status: Single    Spouse name: N/A  . Number  of children: 0  . Years of education: Masters   Occupational History  . Clinical cytogeneticist for [employer]   Social History Main Topics  . Smoking status: Never Smoker  . Smokeless tobacco: Never Used  . Alcohol use No     Comment: social  . Drug use: No  . Sexual activity: Not on file   Other Topics Concern  . Not on file   Social History Narrative   Lives alone.   Exercise--no   Consumes about 2 cups of sweet tea week, 1 ginger ale a day     Outpatient Medications Prior to Visit  Medication Sig Dispense Refill  . albuterol (PROVENTIL HFA;VENTOLIN HFA) 108 (90 Base) MCG/ACT inhaler Inhale 2 puffs into the lungs every 6 (six) hours as needed for wheezing or shortness of breath. 1 Inhaler 0  . beclomethasone (QVAR) 40 MCG/ACT inhaler Inhale 2 puffs into the lungs 2 (two) times daily. 1 Inhaler 6  . cyclobenzaprine (FLEXERIL) 10 MG tablet Take 10 mg by mouth 3 (three) times daily as needed for muscle spasms.    . frovatriptan (FROVA) 2.5 MG tablet May repeat in 2 h prn, no more than 2 pills/24  h, no more than 4 pills/week. 10 tablet 5  . nortriptyline (PAMELOR) 10 MG capsule 1 pill daily at bedtime for one week, then 2 pills daily at bedtime for 1 week then 3 pills at bedtime thereafter. 90 capsule 5  . omeprazole (PRILOSEC) 40 MG capsule TAKE 1 CAPSULE EVERY DAY 30 capsule 11  . ondansetron (ZOFRAN) 4 MG tablet Take 1 tablet (4 mg total) by mouth 2 (two) times daily as needed for nausea or vomiting. 20 tablet 3  . traMADol (ULTRAM) 50 MG tablet Take 1 tablet (50 mg total) by mouth every 8 (eight) hours as needed. 30 tablet 0  . SUMAtriptan (IMITREX) 6 MG/0.5ML SOLN injection Inject 0.5 mLs (6 mg total) into the skin once. May repeat once after 2 hours if not effective. 9 vial 3  . gabapentin (NEURONTIN) 400 MG capsule Take 1 pill nightly at bedtime. 30 capsule 5  . predniSONE (DELTASONE) 20 MG tablet Take 2 pills a day for 3 days, then 1 pill a  day for 3 days 9 tablet 0   No facility-administered medications prior to visit.     Allergies  Allergen Reactions  . Codeine Hives, Shortness Of Breath and Itching  . Flagyl [Metronidazole] Nausea Only  . Hydrocodone Itching  . Oxycodone Itching  . Tussionex Pennkinetic Er [Hydrocod Polst-Cpm Polst Er]     Review of Systems  Constitutional: Negative for fever and malaise/fatigue.  HENT: Negative for congestion.   Eyes: Negative for blurred vision.  Respiratory: Negative for cough and shortness of breath.   Cardiovascular: Negative for chest pain, palpitations and leg swelling.  Gastrointestinal: Negative for vomiting.  Genitourinary: Positive for flank pain and frequency. Negative for hematuria.  Musculoskeletal: Negative for back pain.  Skin: Negative for rash.  Neurological: Negative for loss of consciousness and headaches.       Objective:    Physical Exam  Constitutional: She is oriented to person, place, and time. She appears well-developed and well-nourished. No distress.  HENT:  Head: Normocephalic and atraumatic.  Eyes: Conjunctivae are normal.  Neck: Normal range of motion. No thyromegaly present.  Cardiovascular: Normal rate and regular rhythm.   Pulmonary/Chest: Effort normal and breath sounds normal. She has no wheezes.  Abdominal: Soft. Bowel sounds are normal. There is no tenderness.  Musculoskeletal: Normal range of motion. She exhibits no edema or deformity.  Neurological: She is alert and oriented to person, place, and time.  Skin: Skin is warm and dry. She is not diaphoretic.  Psychiatric: She has a normal mood and affect.    BP 116/76 (BP Location: Right Arm, Cuff Size: Large)   Pulse (!) 115   Temp 98.9 F (37.2 C) (Oral)   Resp 16   Ht 5\' 4"  (1.626 m)   Wt 251 lb 3.2 oz (113.9 kg)   LMP 01/19/2017   SpO2 98%   BMI 43.12 kg/m  Wt Readings from Last 3 Encounters:  01/31/17 251 lb 3.2 oz (113.9 kg)  11/18/16 244 lb (110.7 kg)  09/12/16 250  lb 3.2 oz (113.5 kg)   BP Readings from Last 3 Encounters:  01/31/17 116/76  11/18/16 114/72  09/12/16 120/64      There is no immunization history on file for this patient.  Health Maintenance  Topic Date Due  . HIV Screening  07/30/1991  . PAP SMEAR  04/24/2014  . TETANUS/TDAP  11/21/2016  . INFLUENZA VACCINE  04/16/2017    Lab Results  Component Value Date   WBC 16.6 (  H) 08/17/2014   HGB 12.5 08/17/2014   HCT 37.7 08/17/2014   PLT 337 08/17/2014   GLUCOSE 155 (H) 08/17/2014   ALT 10 03/07/2014   AST 22 03/07/2014   NA 139 08/17/2014   K 3.5 (L) 08/17/2014   CL 99 08/17/2014   CREATININE 0.87 08/17/2014   BUN 10 08/17/2014   CO2 23 08/17/2014   TSH 0.51 07/25/2011   HGBA1C 5.8 04/11/2011    Lab Results  Component Value Date   TSH 0.51 07/25/2011   Lab Results  Component Value Date   WBC 16.6 (H) 08/17/2014   HGB 12.5 08/17/2014   HCT 37.7 08/17/2014   MCV 82.9 08/17/2014   PLT 337 08/17/2014   Lab Results  Component Value Date   NA 139 08/17/2014   K 3.5 (L) 08/17/2014   CO2 23 08/17/2014   GLUCOSE 155 (H) 08/17/2014   BUN 10 08/17/2014   CREATININE 0.87 08/17/2014   BILITOT 0.3 03/07/2014   ALKPHOS 64 03/07/2014   AST 22 03/07/2014   ALT 10 03/07/2014   PROT 8.3 03/07/2014   ALBUMIN 4.2 03/07/2014   CALCIUM 9.3 08/17/2014   ANIONGAP 17 (H) 08/17/2014   GFR 105.16 04/11/2011   No results found for: CHOL No results found for: HDL No results found for: LDLCALC No results found for: TRIG No results found for: CHOLHDL Lab Results  Component Value Date   HGBA1C 5.8 04/11/2011         Assessment & Plan:   Problem List Items Addressed This Visit    None    Visit Diagnoses    Urinary frequency    -  Primary   Relevant Medications   ciprofloxacin (CIPRO) 250 MG tablet   Other Relevant Orders   POCT Urinalysis Dipstick (Automated) (Completed)   Urine culture      I have discontinued Ms. Pacha's gabapentin and predniSONE. I am  also having her start on ciprofloxacin. Additionally, I am having her maintain her beclomethasone, omeprazole, traMADol, SUMAtriptan, ondansetron, cyclobenzaprine, albuterol, frovatriptan, and nortriptyline.  Meds ordered this encounter  Medications  . ciprofloxacin (CIPRO) 250 MG tablet    Sig: Take 1 tablet (250 mg total) by mouth 2 (two) times daily.    Dispense:  6 tablet    Refill:  0    CMA served as scribe during this visit. History, Physical and Plan performed by medical provider. Documentation and orders reviewed and attested to.  Ann Held, DO

## 2017-02-01 LAB — URINE CULTURE

## 2017-02-19 ENCOUNTER — Ambulatory Visit: Payer: 59 | Admitting: Adult Health

## 2017-02-19 NOTE — Progress Notes (Deleted)
PATIENT: Stacy Gentry DOB: 12-26-1975  REASON FOR VISIT: follow up- migraine headaches, obstructive sleep apnea on CPAP HISTORY FROM: patient  HISTORY OF PRESENT ILLNESS: Stacy Gentry is a 41 year old female with a history of objective sleep apnea on CPAP and migraine headaches. She returns today for follow-up. At the last visit she was switched to frovatriptan and nortriptyline to control her migraines. She reports that   HISTORY Copied From Dr. Guadelupe Sabin notes: Stacy Gentry is a 41 year old right-handed woman with an underlying medical history of diabetes, asthma and obesity who presents for follow-up consultation of her migraine headaches and sleep disordered breathing. The patient is unaccompanied today. I last saw her on 08/15/2016, at which time she reported doing okay with AutoPap therapy. She had a home sleep test and we discussed results. She was on gabapentin but headaches were not significantly improved.   11/18/2016 (all dictated new, as well as above notes, some dictation done in note pad or Word, outside of chart, may appear as copied):  I reviewed her AutoPap compliance data from 10/15/2016 through 07/13/2017, which is a total of 30 days, during which time she used her AutoPap 28 days, with percent used days greater than 4 hours at 60%, indicating suboptimal compliance with an AHI of 0.5 per hour, leak low with the 95th percentile at 4.8 L/m, 95th percentile pressure at 9.9 cm, range of 4-12 cm. She reports that her headaches are not better. She does not believe that gabapentin and Imitrex helpful. She reports that her headaches are primarily around her menstrual periods, start before and last throughout, often 3 days in a row. We had talked about potentially utilizing Botox injections in the future. She is reluctant to consider this at this time. She may have tried amitriptyline in the past. She's not completely sure. She definitely tried generic Topamax and we tried  brand-name Trokendi, she also tried [place]. Imitrex injections are helpful but do not last long enough. She does have significant nausea and takes Zofran as needed. She has received epidural steroid injections into her neck about 3-4 times over the past 3 years or so. Her last injection under Dr. Marlou Sa was in July 2017. She also reports low back pain. She may go back for an appointment.  The patient's allergies, current medications, family history, past medical history, past social history, past surgical history and problem list were reviewed and updated as appropriate.   REVIEW OF SYSTEMS: Out of a complete 14 system review of symptoms, the patient complains only of the following symptoms, and all other reviewed systems are negative.  ALLERGIES: Allergies  Allergen Reactions  . Codeine Hives, Shortness Of Breath and Itching  . Flagyl [Metronidazole] Nausea Only  . Hydrocodone Itching  . Oxycodone Itching  . Tussionex Pennkinetic Er [Hydrocod Polst-Cpm Polst Er]     HOME MEDICATIONS: Outpatient Medications Prior to Visit  Medication Sig Dispense Refill  . albuterol (PROVENTIL HFA;VENTOLIN HFA) 108 (90 Base) MCG/ACT inhaler Inhale 2 puffs into the lungs every 6 (six) hours as needed for wheezing or shortness of breath. 1 Inhaler 0  . beclomethasone (QVAR) 40 MCG/ACT inhaler Inhale 2 puffs into the lungs 2 (two) times daily. 1 Inhaler 6  . ciprofloxacin (CIPRO) 250 MG tablet Take 1 tablet (250 mg total) by mouth 2 (two) times daily. 6 tablet 0  . cyclobenzaprine (FLEXERIL) 10 MG tablet Take 10 mg by mouth 3 (three) times daily as needed for muscle spasms.    . frovatriptan (FROVA) 2.5  MG tablet May repeat in 2 h prn, no more than 2 pills/24 h, no more than 4 pills/week. 10 tablet 5  . nortriptyline (PAMELOR) 10 MG capsule 1 pill daily at bedtime for one week, then 2 pills daily at bedtime for 1 week then 3 pills at bedtime thereafter. 90 capsule 5  . omeprazole (PRILOSEC) 40 MG capsule TAKE 1  CAPSULE EVERY DAY 30 capsule 11  . ondansetron (ZOFRAN) 4 MG tablet Take 1 tablet (4 mg total) by mouth 2 (two) times daily as needed for nausea or vomiting. 20 tablet 3  . SUMAtriptan (IMITREX) 6 MG/0.5ML SOLN injection Inject 0.5 mLs (6 mg total) into the skin once. May repeat once after 2 hours if not effective. 9 vial 3  . traMADol (ULTRAM) 50 MG tablet Take 1 tablet (50 mg total) by mouth every 8 (eight) hours as needed. 30 tablet 0   No facility-administered medications prior to visit.     PAST MEDICAL HISTORY: Past Medical History:  Diagnosis Date  . Anxiety   . Asthma   . Bulging lumbar disc   . Depression   . Diabetes mellitus without complication ([place])   . GERD (gastroesophageal reflux disease)   . Hypoglycemia   . Migraines    Dr.Paul Hassell Done in [place]  . PCOS (polycystic ovarian syndrome)    [facility]  . Shoulder disorder   . Syncope     PAST SURGICAL HISTORY: Past Surgical History:  Procedure Laterality Date  . DENTAL SURGERY    . None      FAMILY HISTORY: Family History  Problem Relation Age of Onset  . Diabetes Mother   . Arthritis Mother   . Breast cancer Mother 42  . Hypertension Mother   . Diabetes Maternal Grandmother   . Arthritis Maternal Grandmother   . Diabetes Maternal Aunt   . Hyperlipidemia Paternal Aunt   . Hyperlipidemia Paternal Uncle   . Stroke Paternal Uncle   . Kidney disease Paternal Uncle   . Depression Unknown   . Breast cancer Unknown        maternal cousin  . Heart disease Paternal Uncle 64       Sudden death  . Hypertension Father   . Migraines Sister     SOCIAL HISTORY: Social History   Social History  . Marital status: Single    Spouse name: N/A  . Number of children: 0  . Years of education: Masters   Occupational History  . Clinical cytogeneticist for [employer]   Social History Main Topics  . Smoking status: Never Smoker  . Smokeless tobacco: Never Used  . Alcohol  use No     Comment: social  . Drug use: No  . Sexual activity: Not on file   Other Topics Concern  . Not on file   Social History Narrative   Lives alone.   Exercise--no   Consumes about 2 cups of sweet tea week, 1 ginger ale a day       PHYSICAL EXAM  There were no vitals filed for this visit. There is no height or weight on file to calculate BMI.  Generalized: Well developed, in no acute distress   Neurological examination  Mentation: Alert oriented to time, place, history taking. Follows all commands speech and language fluent Cranial nerve II-XII: Pupils were equal round reactive to light. Extraocular movements were full, visual field were full on confrontational test. Facial sensation and strength were normal. Uvula tongue midline.  Head turning and shoulder shrug  were normal and symmetric. Motor: The motor testing reveals 5 over 5 strength of all 4 extremities. Good symmetric motor tone is noted throughout.  Sensory: Sensory testing is intact to soft touch on all 4 extremities. No evidence of extinction is noted.  Coordination: Cerebellar testing reveals good finger-nose-finger and heel-to-shin bilaterally.  Gait and station: Gait is normal. Tandem gait is normal. Romberg is negative. No drift is seen.  Reflexes: Deep tendon reflexes are symmetric and normal bilaterally.   DIAGNOSTIC DATA (LABS, IMAGING, TESTING) - I reviewed patient records, labs, notes, testing and imaging myself where available.  Lab Results  Component Value Date   WBC 16.6 (H) 08/17/2014   HGB 12.5 08/17/2014   HCT 37.7 08/17/2014   MCV 82.9 08/17/2014   PLT 337 08/17/2014      Component Value Date/Time   NA 139 08/17/2014 1419   K 3.5 (L) 08/17/2014 1419   CL 99 08/17/2014 1419   CO2 23 08/17/2014 1419   GLUCOSE 155 (H) 08/17/2014 1419   BUN 10 08/17/2014 1419   CREATININE 0.87 08/17/2014 1419   CALCIUM 9.3 08/17/2014 1419   PROT 8.3 03/07/2014 2139   ALBUMIN 4.2 03/07/2014 2139   AST  22 03/07/2014 2139   ALT 10 03/07/2014 2139   ALKPHOS 64 03/07/2014 2139   BILITOT 0.3 03/07/2014 2139   GFRNONAA 83 (L) 08/17/2014 1419   GFRAA >90 08/17/2014 1419   No results found for: CHOL, HDL, LDLCALC, LDLDIRECT, TRIG, CHOLHDL Lab Results  Component Value Date   HGBA1C 5.8 04/11/2011   No results found for: [id number] Lab Results  Component Value Date   TSH 0.51 07/25/2011      ASSESSMENT AND PLAN 41 y.o. year old female  has a past medical history of Anxiety; Asthma; Bulging lumbar disc; Depression; Diabetes mellitus without complication ([place]); GERD (gastroesophageal reflux disease); Hypoglycemia; Migraines; PCOS (polycystic ovarian syndrome); Shoulder disorder; and Syncope. here with:  1. Migraine headaches 2. Obstructive sleep apnea on CPAP     Ward Givens, MSN, NP-C 02/19/2017, 7:42 AM [facility] [address], [place], [place] [address] [phone]

## 2017-02-20 ENCOUNTER — Encounter: Payer: Self-pay | Admitting: Adult Health

## 2017-04-01 ENCOUNTER — Encounter (HOSPITAL_COMMUNITY): Payer: Self-pay | Admitting: Emergency Medicine

## 2017-04-01 ENCOUNTER — Ambulatory Visit (HOSPITAL_COMMUNITY)
Admission: EM | Admit: 2017-04-01 | Discharge: 2017-04-01 | Disposition: A | Discharge status: Home / Self Care | Payer: 59

## 2017-04-01 DIAGNOSIS — M79671 Pain in right foot: Secondary | ICD-10-CM | POA: Diagnosis not present

## 2017-04-01 MED ORDER — PREDNISONE 10 MG (21) PO TBPK: ORAL_TABLET | Freq: Every day | ORAL | 0 refills | Status: DC

## 2017-04-01 NOTE — ED Provider Notes (Signed)
CSN: [id number]     Arrival date & time 04/01/17  1713 History   First MD Initiated Contact with Patient 04/01/17 1748     Chief Complaint  Patient presents with  . Foot Pain   (Consider location/radiation/quality/duration/timing/severity/associated sxs/prior Treatment) 41 year old female presents to clinic with a chief complaint of right foot pain. Denies any history of trauma, has no known history of diabetes, or vascular disease. She works in an office setting, as an Music therapist. States the pain started after putting on some dry shoes for church. Has no fever, chills, nausea, or other systemic symptoms. Denies any other symptoms   The history is provided by the patient.    Past Medical History:  Diagnosis Date  . Anxiety   . Asthma   . Bulging lumbar disc   . Depression   . Diabetes mellitus without complication ([place])   . GERD (gastroesophageal reflux disease)   . Hypoglycemia   . Migraines    Dr.Paul Hassell Done in [place]  . PCOS (polycystic ovarian syndrome)    [facility]  . Shoulder disorder   . Syncope    Past Surgical History:  Procedure Laterality Date  . DENTAL SURGERY    . None     Family History  Problem Relation Age of Onset  . Diabetes Mother   . Arthritis Mother   . Breast cancer Mother 37  . Hypertension Mother   . Diabetes Maternal Grandmother   . Arthritis Maternal Grandmother   . Diabetes Maternal Aunt   . Hyperlipidemia Paternal Aunt   . Hyperlipidemia Paternal Uncle   . Stroke Paternal Uncle   . Kidney disease Paternal Uncle   . Depression Unknown   . Breast cancer Unknown        maternal cousin  . Heart disease Paternal Uncle 31       Sudden death  . Hypertension Father   . Migraines Sister    Social History  Substance Use Topics  . Smoking status: Never Smoker  . Smokeless tobacco: Never Used  . Alcohol use No     Comment: social   OB History    Gravida Para Term Preterm AB Living   0             SAB TAB Ectopic Multiple Live  Births                 Review of Systems  Constitutional: Negative.   HENT: Negative.   Cardiovascular: Negative.   Gastrointestinal: Negative.   Musculoskeletal:       Right foot pain  Skin: Negative.   Neurological: Negative.     Allergies  Codeine; Flagyl [metronidazole]; Hydrocodone; Oxycodone; and Tussionex pennkinetic er [hydrocod polst-cpm polst er]  Home Medications   Prior to Admission medications   Medication Sig Start Date End Date Taking? Authorizing Provider  diclofenac (VOLTAREN) 75 MG EC tablet Take 75 mg by mouth 2 (two) times daily.   Yes [provider]  ondansetron (ZOFRAN) 4 MG tablet Take 1 tablet (4 mg total) by mouth 2 (two) times daily as needed for nausea or vomiting. 08/15/16  Yes Star Age, MD  SUMAtriptan (IMITREX) 6 MG/0.5ML SOLN injection Inject 0.5 mLs (6 mg total) into the skin once. May repeat once after 2 hours if not effective. 08/15/16 04/01/17 Yes Star Age, MD  predniSONE (STERAPRED UNI-PAK 21 TAB) 10 MG (21) TBPK tablet Take by mouth daily. Take 6 tabs by mouth daily  for 2 days, then 5 tabs  for 2 days, then 4 tabs for 2 days, then 3 tabs for 2 days, 2 tabs for 2 days, then 1 tab by mouth daily for 2 days 04/01/17   Barnet Glasgow, NP   Meds Ordered and Administered this Visit  Medications - No data to display  BP (!) 139/94 (BP Location: Right Arm)   Pulse (!) 107   Temp 99 F (37.2 C) (Oral)   Resp 18   SpO2 100%  No data found.   Physical Exam  Constitutional: She is oriented to person, place, and time. She appears well-developed and well-nourished. No distress.  HENT:  Head: Normocephalic and atraumatic.  Right Ear: External ear normal.  Left Ear: External ear normal.  Eyes: Conjunctivae are normal.  Musculoskeletal:       Right foot: There is tenderness. There is no swelling and normal capillary refill.       Feet:  Neurological: She is alert and oriented to person, place, and time.  Skin: Skin is warm and  dry. Capillary refill takes less than 2 seconds. No rash noted. She is not diaphoretic. No erythema.  Psychiatric: She has a normal mood and affect. Her behavior is normal.  Nursing note and vitals reviewed.   Urgent Care Course     Procedures (including critical care time)  Labs Review Labs Reviewed - No data to display  Imaging Review No results found.  MDM   1. Foot pain, right     Differential includes tendonitis, Morton's neuroma, plantar fasciitis, and others. Treating with steroid taper, rest, ice, orthotics, follow up with podiatry      Barnet Glasgow, NP 04/01/17 1814

## 2017-04-01 NOTE — ED Triage Notes (Signed)
The patient presented to the [facility] with a complaint of right foot pain x 3 days. The patient denied any known injury to the foot.

## 2017-04-01 NOTE — Discharge Instructions (Signed)
You pain symptoms are consistent and similar to either morton's neuroma or plantar fasciitis. I have started you on a high dose of steroids, take as directed. Rest, avoid walking when possible and keep you foot elevated. Contact Dr. Amalia Hailey to schedule follow up care if your pain persists or fails to resolve.

## 2017-04-09 ENCOUNTER — Ambulatory Visit (INDEPENDENT_AMBULATORY_CARE_PROVIDER_SITE_OTHER): Payer: 59

## 2017-04-09 ENCOUNTER — Encounter: Payer: Self-pay | Admitting: Podiatry

## 2017-04-09 ENCOUNTER — Ambulatory Visit (INDEPENDENT_AMBULATORY_CARE_PROVIDER_SITE_OTHER): Payer: 59 | Admitting: Podiatry

## 2017-04-09 DIAGNOSIS — M722 Plantar fascial fibromatosis: Secondary | ICD-10-CM

## 2017-04-09 DIAGNOSIS — G5791 Unspecified mononeuropathy of right lower limb: Secondary | ICD-10-CM

## 2017-04-09 MED ORDER — NONFORMULARY OR COMPOUNDED ITEM: 0 refills | Status: DC

## 2017-04-12 MED ORDER — BETAMETHASONE SOD PHOS & ACET 6 (3-3) MG/ML IJ SUSP: 3.0000 mg | Freq: Once | INTRAMUSCULAR | Status: AC

## 2017-04-12 NOTE — Progress Notes (Signed)
   Subjective: Patient presents today for pain and tenderness in the right foot. Patient states the foot pain has been hurting for several weeks now. Patient states that it hurts in the mornings with the first steps out of bed. Patient presents today for further treatment and evaluation Patient also complains of some numbness and tingling to the second third digits the right foot. Again the patient denies trauma states that the onset was gradual.  Objective: Physical Exam General: The patient is alert and oriented x3 in no acute distress.  Dermatology: Skin is warm, dry and supple bilateral lower extremities. Negative for open lesions or macerations bilateral.   Vascular: Dorsalis Pedis and Posterior Tibial pulses palpable bilateral.  Capillary fill time is immediate to all digits.  Neurological: Epicritic and protective threshold intact bilateral.   Musculoskeletal: Tenderness to palpation at the medial calcaneal tubercale and through the insertion of the plantar fascia of the right foot. All other joints range of motion within normal limits bilateral. Strength 5/5 in all groups bilateral.   Radiographic exam: Normal osseous mineralization. Joint spaces preserved. No fracture/dislocation/boney destruction. Calcaneal spur present with mild thickening of plantar fascia right. No other soft tissue abnormalities or radiopaque foreign bodies.   Assessment: 1. Plantar fasciitis right-mid substance 2. Pain in right foot 3. Neuritis right foot  Plan of Care:  1. Patient evaluated. Xrays reviewed.   2. Injection of 0.5cc Celestone soluspan injected into the right plantar fascia  3. The patient is RAD taking diclofenac 75 mg as needed for back pain. 4. Plantar fascial band(s) dispensed 5. Instructed patient regarding therapies and modalities at home to alleviate symptoms.  6. Prescription for peripheral neuropathy pain cream dispensed through [facility]  7. Return to clinic in 4 weeks.      Edrick Kins, DPM [facility]  Dr. Edrick Kins, DPM    [address], [place] [address]                Office [phone]  Fax [phone]

## 2017-04-28 ENCOUNTER — Encounter: Payer: Self-pay | Admitting: Podiatry

## 2017-05-05 NOTE — Telephone Encounter (Signed)
Sure, please prescribe. Thanks, Dr. Amalia Hailey

## 2017-05-06 ENCOUNTER — Telehealth: Payer: Self-pay | Admitting: *Deleted

## 2017-05-06 MED ORDER — DICLOFENAC SODIUM 75 MG PO TBEC: 75.0000 mg | DELAYED_RELEASE_TABLET | Freq: Two times a day (BID) | ORAL | 0 refills | Status: DC

## 2017-05-06 NOTE — Telephone Encounter (Signed)
Pt request diclofenac sodium. Dr. Amalia Hailey ordered Diclofenac sodium 75mg  #60 one tablet bid no refills. Emailed to pt that rx was ordered to [facility].

## 2017-05-07 ENCOUNTER — Ambulatory Visit (INDEPENDENT_AMBULATORY_CARE_PROVIDER_SITE_OTHER): Payer: 59 | Admitting: Podiatry

## 2017-05-07 ENCOUNTER — Encounter: Payer: Self-pay | Admitting: Podiatry

## 2017-05-07 DIAGNOSIS — M722 Plantar fascial fibromatosis: Secondary | ICD-10-CM

## 2017-05-07 DIAGNOSIS — G5791 Unspecified mononeuropathy of right lower limb: Secondary | ICD-10-CM

## 2017-05-07 MED ORDER — DICLOFENAC SODIUM 75 MG PO TBEC: 75.0000 mg | DELAYED_RELEASE_TABLET | Freq: Two times a day (BID) | ORAL | 0 refills | Status: DC

## 2017-05-17 MED ORDER — BETAMETHASONE SOD PHOS & ACET 6 (3-3) MG/ML IJ SUSP: 3.0000 mg | Freq: Once | INTRAMUSCULAR | Status: AC

## 2017-05-17 NOTE — Progress Notes (Signed)
   Subjective: Patient presents today for follow-up evaluation of plantar fasciitis to the right mid substance. Patient states that the pain is better. She has pain in the arch when trying to wear heels. She would like a refill prescription of diclofenac today. She believes the injection helped significantly.  Objective: Physical Exam General: The patient is alert and oriented x3 in no acute distress.  Dermatology: Skin is warm, dry and supple bilateral lower extremities. Negative for open lesions or macerations bilateral.   Vascular: Dorsalis Pedis and Posterior Tibial pulses palpable bilateral.  Capillary fill time is immediate to all digits.  Neurological: Epicritic and protective threshold intact bilateral.   Musculoskeletal: Tenderness to palpation at the medial calcaneal tubercale and through the insertion of the plantar fascia of the right foot. All other joints range of motion within normal limits bilateral. Strength 5/5 in all groups bilateral.   Assessment: 1. Plantar fasciitis right-mid substance 2. Pain in right foot 3. Neuritis right foot  Plan of Care:  1. Patient evaluated.   2. Injection of 0.5cc Celestone soluspan injected into the right plantar fascia  3. Continue peripheral neuropathy pain cream through [facility] 4. Today a refill prescription for diclofenac 75 mg 5. Discontinue the plantar fascial brace. Patient states it aggravated her injury more 6. Return to clinic in 6 weeks  Edrick Kins, DPM [facility]  Dr. Edrick Kins, DPM    [address], [place] [address]                Office [phone]  Fax [phone]

## 2017-06-11 ENCOUNTER — Ambulatory Visit: Payer: 59 | Admitting: Podiatry

## 2017-06-12 ENCOUNTER — Ambulatory Visit: Payer: 59 | Admitting: Podiatry

## 2017-06-18 ENCOUNTER — Ambulatory Visit: Payer: 59 | Admitting: Podiatry

## 2017-09-15 ENCOUNTER — Encounter (HOSPITAL_COMMUNITY): Payer: Self-pay | Admitting: Family Medicine

## 2017-09-15 ENCOUNTER — Ambulatory Visit (HOSPITAL_COMMUNITY)
Admission: EM | Admit: 2017-09-15 | Discharge: 2017-09-15 | Disposition: A | Discharge status: Home / Self Care | Payer: 59 | Attending: Emergency Medicine | Admitting: Emergency Medicine

## 2017-09-15 DIAGNOSIS — W57XXXA Bitten or stung by nonvenomous insect and other nonvenomous arthropods, initial encounter: Secondary | ICD-10-CM

## 2017-09-15 DIAGNOSIS — S40862A Insect bite (nonvenomous) of left upper arm, initial encounter: Secondary | ICD-10-CM

## 2017-09-15 MED ORDER — TRIAMCINOLONE ACETONIDE 0.1 % EX CREA: 1.0000 "application " | TOPICAL_CREAM | Freq: Two times a day (BID) | CUTANEOUS | 0 refills | Status: DC

## 2017-09-15 NOTE — ED Provider Notes (Signed)
[place]    CSN: [id number] Arrival date & time: 09/15/17  1233     History   Chief Complaint Chief Complaint  Patient presents with  . Insect Bite    HPI Stacy Gentry is a 41 y.o. female.   41 year old female complaining of small red itchy papules. Primarily to the left upper extremity there are a total of 4. Developed some in the last 2 days.      Past Medical History:  Diagnosis Date  . Anxiety   . Asthma   . Bulging lumbar disc   . Depression   . Diabetes mellitus without complication ([place])   . GERD (gastroesophageal reflux disease)   . Hypoglycemia   . Migraines    Dr.Paul Hassell Done in [place]  . PCOS (polycystic ovarian syndrome)    [facility]  . Shoulder disorder   . Syncope     Patient Active Problem List   Diagnosis Date Noted  . Acute sinusitis with symptoms > 10 days 04/07/2014  . Viral illness 01/19/2014  . Asthmatic bronchitis with acute exacerbation 10/01/2013  . Obesity (BMI 30-39.9) 09/15/2013  . Perianal abscess 07/02/2013  . Rectal pain 06/15/2013  . Perirectal abscess 06/14/2013  . Diarrhea 03/31/2012  . Unspecified asthma(493.90) 11/22/2011  . Hypertension 07/25/2011  . Tachycardia 07/25/2011  . Obesity 07/25/2011  . Dyspnea 07/25/2011  . PCOS (polycystic ovarian syndrome) 04/11/2011  . Migraines 04/11/2011    Past Surgical History:  Procedure Laterality Date  . DENTAL SURGERY    . None      OB History    Gravida Para Term Preterm AB Living   0             SAB TAB Ectopic Multiple Live Births                   Home Medications    Prior to Admission medications   Medication Sig Start Date End Date Taking? Authorizing Provider  diclofenac (VOLTAREN) 75 MG EC tablet Take 75 mg by mouth 2 (two) times daily.    [provider]  diclofenac (VOLTAREN) 75 MG EC tablet Take 1 tablet (75 mg total) by mouth 2 (two) times daily. 05/07/17   Edrick Kins, DPM  NONFORMULARY OR COMPOUNDED ITEM  [facility]  Peripheral Neuropathy Cream- Bupivacaine 1%, Doxepin 3%, Gabapentin 6%, Pentoxifylline 3%, Topiramate 1% Apply 1-2 grams to affected area 3-4 times daily Qty. 120 gm 3 refills 04/09/17   Daylene Katayama M, DPM  ondansetron (ZOFRAN) 4 MG tablet Take 1 tablet (4 mg total) by mouth 2 (two) times daily as needed for nausea or vomiting. 08/15/16   Star Age, MD  predniSONE (STERAPRED UNI-PAK 21 TAB) 10 MG (21) TBPK tablet Take by mouth daily. Take 6 tabs by mouth daily  for 2 days, then 5 tabs for 2 days, then 4 tabs for 2 days, then 3 tabs for 2 days, 2 tabs for 2 days, then 1 tab by mouth daily for 2 days 04/01/17   Barnet Glasgow, NP  SUMAtriptan (IMITREX) 6 MG/0.5ML SOLN injection Inject 0.5 mLs (6 mg total) into the skin once. May repeat once after 2 hours if not effective. 08/15/16 04/01/17  Star Age, MD  triamcinolone cream (KENALOG) 0.1 % Apply 1 application topically 2 (two) times daily. 09/15/17   Janne Napoleon, NP    Family History Family History  Problem Relation Age of Onset  . Diabetes Mother   . Arthritis Mother   .  Breast cancer Mother 96  . Hypertension Mother   . Hypertension Father   . Diabetes Maternal Grandmother   . Arthritis Maternal Grandmother   . Diabetes Maternal Aunt   . Hyperlipidemia Paternal Aunt   . Hyperlipidemia Paternal Uncle   . Stroke Paternal Uncle   . Kidney disease Paternal Uncle   . Depression Unknown   . Breast cancer Unknown        maternal cousin  . Heart disease Paternal Uncle 80       Sudden death  . Migraines Sister     Social History Social History   Tobacco Use  . Smoking status: Never Smoker  . Smokeless tobacco: Never Used  Substance Use Topics  . Alcohol use: No    Alcohol/week: 0.0 oz    Comment: social  . Drug use: No     Allergies   Codeine; Flagyl [metronidazole]; Hydrocodone; Oxycodone; and Tussionex pennkinetic er [hydrocod polst-cpm polst er]   Review of Systems Review of Systems    Constitutional: Negative.   Skin:       As per history of present illness  All other systems reviewed and are negative.    Physical Exam Triage Vital Signs ED Triage Vitals [09/15/17 1319]  Enc Vitals Group     BP 132/80     Pulse Rate (!) 109     Resp 18     Temp 98 F (36.7 C)     Temp src      SpO2 100 %     Weight      Height      Head Circumference      Peak Flow      Pain Score      Pain Loc      Pain Edu?      Excl. in [place]?    No data found.  Updated Vital Signs BP 132/80   Pulse (!) 109   Temp 98 F (36.7 C)   Resp 18   LMP 08/24/2017   SpO2 100%   Visual Acuity Right Eye Distance:   Left Eye Distance:   Bilateral Distance:    Right Eye Near:   Left Eye Near:    Bilateral Near:     Physical Exam  Constitutional: She is oriented to person, place, and time. She appears well-developed and well-nourished. No distress.  Eyes: EOM are normal.  Neck: Normal range of motion. Neck supple.  Cardiovascular: Normal rate.  Pulmonary/Chest: Effort normal. No respiratory distress.  Musculoskeletal: She exhibits no edema.  Neurological: She is alert and oriented to person, place, and time. She exhibits normal muscle tone.  Skin: Skin is warm and dry.  Small annular lesion to the left volar wrist approximately 1 cm across proper position with the Center small papule. This is the first and over 1. There are 3-4 raised red areas to the left inner aspect of the elbow arranged in a curvilinear road. No drainage. No signs of infection. This appears to be a localized dermal reaction to insect bite.  Psychiatric: She has a normal mood and affect.  Nursing note and vitals reviewed.    UC Treatments / Results  Labs (all labs ordered are listed, but only abnormal results are displayed) Labs Reviewed - No data to display  EKG  EKG Interpretation None       Radiology No results found.  Procedures Procedures (including critical care time)  Medications  Ordered in UC Medications - No data to display  Initial Impression / Assessment and Plan / UC Course  I have reviewed the triage vital signs and the nursing notes.  Pertinent labs & imaging results that were available during my care of the patient were reviewed by me and considered in my medical decision making (see chart for details).    Use the triamcinolone cream to the areas of bites twice a day. May use Benadryl cream 4 times a day. May also apply ice to the areas of the most itching.     Final Clinical Impressions(s) / UC Diagnoses   Final diagnoses:  Insect bite, initial encounter    ED Discharge Orders        Ordered    triamcinolone cream (KENALOG) 0.1 %  2 times daily     09/15/17 1438       Controlled Substance Prescriptions [place] Controlled Substance Registry consulted? Not Applicable   Janne Napoleon, NP 09/15/17 1443

## 2017-09-15 NOTE — Discharge Instructions (Signed)
Use the triamcinolone cream to the areas of bites twice a day. May use Benadryl cream 4 times a day. May also apply ice to the areas of the most itching.

## 2017-09-15 NOTE — ED Triage Notes (Signed)
Pt here for insect bites to left arm. sts itching and burning.

## 2017-09-23 ENCOUNTER — Telehealth: Payer: Self-pay

## 2017-09-23 NOTE — Telephone Encounter (Signed)
Received this notice from [facility]: "I wanted to let you know that this patient returned her Cpap today due to non use and benefit.  Let me know if you need anything from me. "

## 2017-11-22 ENCOUNTER — Ambulatory Visit (INDEPENDENT_AMBULATORY_CARE_PROVIDER_SITE_OTHER): Payer: 59 | Admitting: Family Medicine

## 2017-11-22 ENCOUNTER — Encounter: Payer: Self-pay | Admitting: Family Medicine

## 2017-11-22 VITALS — BP 104/60 | HR 89 | Temp 98.0°F | Resp 16 | Ht 64.0 in | Wt 248.2 lb

## 2017-11-22 DIAGNOSIS — J029 Acute pharyngitis, unspecified: Secondary | ICD-10-CM

## 2017-11-22 DIAGNOSIS — J069 Acute upper respiratory infection, unspecified: Secondary | ICD-10-CM

## 2017-11-22 LAB — POCT RAPID STREP A (OFFICE): Rapid Strep A Screen: NEGATIVE

## 2017-11-22 MED ORDER — BENZONATATE 100 MG PO CAPS: 100.0000 mg | ORAL_CAPSULE | Freq: Three times a day (TID) | ORAL | 0 refills | Status: DC | PRN

## 2017-11-22 MED ORDER — AMOXICILLIN 500 MG PO CAPS: 500.0000 mg | ORAL_CAPSULE | Freq: Three times a day (TID) | ORAL | 0 refills | Status: DC

## 2017-11-22 NOTE — Patient Instructions (Addendum)
Tessalon perles if needed for cough, cepacol for sore throat if needed. Tylenol or Advil if needed, and see other info below for sore throat.  If not improving this week - can start antibiotic. Return to the clinic or go to the nearest emergency room if any of your symptoms worsen or new symptoms occur.   Sore Throat A sore throat is pain, burning, irritation, or scratchiness in the throat. When you have a sore throat, you may feel pain or tenderness in your throat when you swallow or talk. Many things can cause a sore throat, including:  An infection.  Seasonal allergies.  Dryness in the air.  Irritants, such as smoke or pollution.  Gastroesophageal reflux disease (GERD).  A tumor.  A sore throat is often the first sign of another sickness. It may happen with other symptoms, such as coughing, sneezing, fever, and swollen neck glands. Most sore throats go away without medical treatment. Follow these instructions at home:  Take over-the-counter medicines only as told by your health care provider.  Drink enough fluids to keep your urine clear or pale yellow.  Rest as needed.  To help with pain, try: ? Sipping warm liquids, such as broth, herbal tea, or warm water. ? Eating or drinking cold or frozen liquids, such as frozen ice pops. ? Gargling with a salt-water mixture 3-4 times a day or as needed. To make a salt-water mixture, completely dissolve -1 tsp of salt in 1 cup of warm water. ? Sucking on hard candy or throat lozenges. ? Putting a cool-mist humidifier in your bedroom at night to moisten the air. ? Sitting in the bathroom with the door closed for 5-10 minutes while you run hot water in the shower.  Do not use any tobacco products, such as cigarettes, chewing tobacco, and e-cigarettes. If you need help quitting, ask your health care provider. Contact a health care provider if:  You have a fever for more than 2-3 days.  You have symptoms that last (are persistent) for  more than 2-3 days.  Your throat does not get better within 7 days.  You have a fever and your symptoms suddenly get worse. Get help right away if:  You have difficulty breathing.  You cannot swallow fluids, soft foods, or your saliva.  You have increased swelling in your throat or neck.  You have persistent nausea and vomiting. This information is not intended to replace advice given to you by your health care provider. Make sure you discuss any questions you have with your health care provider. Document Released: 10/10/2004 Document Revised: 04/28/2016 Document Reviewed: 06/23/2015 Elsevier Interactive Patient Education  2018 [employer].    IF you received an x-ray today, you will receive an invoice from [facility]. Please contact [facility] at [phone] with questions or concerns regarding your invoice.   IF you received labwork today, you will receive an invoice from [facility]. Please contact [facility] at [phone] with questions or concerns regarding your invoice.   Our billing staff will not be able to assist you with questions regarding bills from these companies.  You will be contacted with the lab results as soon as they are available. The fastest way to get your results is to activate your My Chart account. Instructions are located on the last page of this paperwork. If you have not heard from [place] regarding the results in 2 weeks, please contact this office.

## 2017-11-22 NOTE — Progress Notes (Signed)
Subjective:    Patient ID: Stacy Gentry, female    DOB: 1976/06/19, 42 y.o.   MRN: [id number]  HPI Stacy Gentry is a 42 y.o. female Presents today for: Chief Complaint  Patient presents with  . Sore Throat   Has been experiencing sore throat for 2 weeks, initially seen at Urgent Care for cough, drainage, sore throat 12 days. Had negative strep test, low grade fever at the time.no antibiotic Rx, but cough syrup Rx, flonase. Unable to find mouthwash Rx. Cough and congestion improved but Sore throat has remained.  Tylenol helps, then returns. Sore on right side. No recent fever.   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     Patient Active Problem List   Diagnosis Date Noted  . Acute sinusitis with symptoms > 10 days 04/07/2014  . Viral illness 01/19/2014  . Asthmatic bronchitis with acute exacerbation 10/01/2013  . Obesity (BMI 30-39.9) 09/15/2013  . Perianal abscess 07/02/2013  . Rectal pain 06/15/2013  . Perirectal abscess 06/14/2013  . Diarrhea 03/31/2012  . Unspecified asthma(493.90) 11/22/2011  . Hypertension 07/25/2011  . Tachycardia 07/25/2011  . Obesity 07/25/2011  . Dyspnea 07/25/2011  . PCOS (polycystic ovarian syndrome) 04/11/2011  . Migraines 04/11/2011   Past Medical History:  Diagnosis Date  . Anxiety   . Asthma   . Bulging lumbar disc   . Depression   . Diabetes mellitus without complication ([place])   . GERD (gastroesophageal reflux disease)   . Hypoglycemia   . Migraines    Dr.Paul Hassell Done in [place]  . PCOS (polycystic ovarian syndrome)    [facility]  . Shoulder disorder   . Syncope    Past Surgical History:  Procedure Laterality Date  . DENTAL SURGERY    . None     Allergies  Allergen Reactions  . Codeine Hives, Shortness Of Breath and Itching  . Flagyl [Metronidazole] Nausea Only  . Hydrocodone Itching  . Oxycodone Itching  . Tussionex Pennkinetic Er [Hydrocod Polst-Cpm Polst Er]    Prior to Admission medications     Medication Sig Start Date End Date Taking? Authorizing Provider  diclofenac (VOLTAREN) 75 MG EC tablet Take 1 tablet (75 mg total) by mouth 2 (two) times daily. 05/07/17  Yes Edrick Kins, DPM  ondansetron (ZOFRAN) 4 MG tablet Take 1 tablet (4 mg total) by mouth 2 (two) times daily as needed for nausea or vomiting. 08/15/16  Yes Star Age, MD  SUMAtriptan (IMITREX) 6 MG/0.5ML SOLN injection Inject 0.5 mLs (6 mg total) into the skin once. May repeat once after 2 hours if not effective. 08/15/16 11/22/17 Yes Star Age, MD   Social History   Socioeconomic History  . Marital status: Single    Spouse name: Not on file  . Number of children: 0  . Years of education: Masters  . Highest education level: Not on file  Social Needs  . Financial resource strain: Not on file  . Food insecurity - worry: Not on file  . Food insecurity - inability: Not on file  . Transportation needs - medical: Not on file  . Transportation needs - non-medical: Not on file  Occupational History  . Occupation: Clinical cytogeneticist for [employer]: [employer]  Tobacco Use  . Smoking status: Never Smoker  . Smokeless tobacco: Never Used  Substance and Sexual Activity  . Alcohol use: No    Alcohol/week: 0.0 oz    Comment: social  . Drug use:  No  . Sexual activity: Not on file  Other Topics Concern  . Not on file  Social History Narrative   Lives alone.   Exercise--no   Consumes about 2 cups of sweet tea week, 1 ginger ale a day    Review of Systems  Constitutional: Negative for appetite change, chills and fever.  HENT: Positive for sore throat. Negative for mouth sores, rhinorrhea, sinus pressure, sinus pain, trouble swallowing and voice change.   Respiratory: Negative for cough, shortness of breath and wheezing.   Gastrointestinal: Negative for abdominal pain.  Skin: Negative for rash.       Objective:   Physical Exam  Constitutional: She is oriented to  person, place, and time. She appears well-developed and well-nourished. No distress.  HENT:  Head: Normocephalic and atraumatic.  Right Ear: Hearing, tympanic membrane, external ear and ear canal normal.  Left Ear: Hearing, tympanic membrane, external ear and ear canal normal.  Nose: Nose normal.  Mouth/Throat: Oropharynx is clear and moist. No oropharyngeal exudate.  Posterior OP clear.   Eyes: Conjunctivae and EOM are normal. Pupils are equal, round, and reactive to light.  Neck: Normal range of motion. Neck supple.  Cardiovascular: Normal rate, regular rhythm, normal heart sounds and intact distal pulses.  No murmur heard. Pulmonary/Chest: Effort normal and breath sounds normal. No respiratory distress. She has no wheezes. She has no rhonchi.  Lymphadenopathy:    She has cervical adenopathy (possible small tender node on R AC.).  Neurological: She is alert and oriented to person, place, and time.  Skin: Skin is warm and dry. No rash noted.  Psychiatric: She has a normal mood and affect. Her behavior is normal.  Vitals reviewed.  Vitals:   11/22/17 1047 11/22/17 1121  BP: 104/60   Pulse: (!) 112 89  Resp: 16   Temp: 98 F (36.7 C)   TempSrc: Oral   SpO2: 98%   Weight: 248 lb 3.2 oz (112.6 kg)   Height: 5\' 4"  (1.626 m)    Results for orders placed or performed in visit on 11/22/17  POCT rapid strep A  Result Value Ref Range   Rapid Strep A Screen Negative Negative       Assessment & Plan:    Stacy Gentry is a 42 y.o. female Sore throat - Plan: POCT rapid strep A, Culture, Group A Strep, amoxicillin (AMOXIL) 500 MG capsule  Acute upper respiratory infection - Plan: benzonatate (TESSALON) 100 MG capsule  Suspected initial viral infection with persistent sore throat. Afebrile, overall reassuring exam today. Minimal tenderness right AC node, but no other signs of strep throat, and rapid strep negative. Throat culture pending.  -Symptom Medicare with Cepacol,  Tessalon Perles as needed for cough, fluids, Tylenol or Motrin if needed for pain, RTC precautions if worsening.  -If not improving this week, amoxicillin provided. Side effects discussed.  Meds ordered this encounter  Medications  . amoxicillin (AMOXIL) 500 MG capsule    Sig: Take 1 capsule (500 mg total) by mouth 3 (three) times daily.    Dispense:  30 capsule    Refill:  0  . benzonatate (TESSALON) 100 MG capsule    Sig: Take 1 capsule (100 mg total) by mouth 3 (three) times daily as needed for cough.    Dispense:  20 capsule    Refill:  0   Patient Instructions   Tessalon perles if needed for cough, cepacol for sore throat if needed. Tylenol or Advil if needed, and see other  info below for sore throat.  If not improving this week - can start antibiotic. Return to the clinic or go to the nearest emergency room if any of your symptoms worsen or new symptoms occur.   Sore Throat A sore throat is pain, burning, irritation, or scratchiness in the throat. When you have a sore throat, you may feel pain or tenderness in your throat when you swallow or talk. Many things can cause a sore throat, including:  An infection.  Seasonal allergies.  Dryness in the air.  Irritants, such as smoke or pollution.  Gastroesophageal reflux disease (GERD).  A tumor.  A sore throat is often the first sign of another sickness. It may happen with other symptoms, such as coughing, sneezing, fever, and swollen neck glands. Most sore throats go away without medical treatment. Follow these instructions at home:  Take over-the-counter medicines only as told by your health care provider.  Drink enough fluids to keep your urine clear or pale yellow.  Rest as needed.  To help with pain, try: ? Sipping warm liquids, such as broth, herbal tea, or warm water. ? Eating or drinking cold or frozen liquids, such as frozen ice pops. ? Gargling with a salt-water mixture 3-4 times a day or as needed. To make a  salt-water mixture, completely dissolve -1 tsp of salt in 1 cup of warm water. ? Sucking on hard candy or throat lozenges. ? Putting a cool-mist humidifier in your bedroom at night to moisten the air. ? Sitting in the bathroom with the door closed for 5-10 minutes while you run hot water in the shower.  Do not use any tobacco products, such as cigarettes, chewing tobacco, and e-cigarettes. If you need help quitting, ask your health care provider. Contact a health care provider if:  You have a fever for more than 2-3 days.  You have symptoms that last (are persistent) for more than 2-3 days.  Your throat does not get better within 7 days.  You have a fever and your symptoms suddenly get worse. Get help right away if:  You have difficulty breathing.  You cannot swallow fluids, soft foods, or your saliva.  You have increased swelling in your throat or neck.  You have persistent nausea and vomiting. This information is not intended to replace advice given to you by your health care provider. Make sure you discuss any questions you have with your health care provider. Document Released: 10/10/2004 Document Revised: 04/28/2016 Document Reviewed: 06/23/2015 Elsevier Interactive Patient Education  2018 [employer].    IF you received an x-ray today, you will receive an invoice from [facility]. Please contact [facility] Radiology at [phone] with questions or concerns regarding your invoice.   IF you received labwork today, you will receive an invoice from [facility]. Please contact [facility] at [phone] with questions or concerns regarding your invoice.   Our billing staff will not be able to assist you with questions regarding bills from these companies.  You will be contacted with the lab results as soon as they are available. The fastest way to get your results is to activate your My Chart account. Instructions are located on the last page of this paperwork. If you  have not heard from [place] regarding the results in 2 weeks, please contact this office.       I personally performed the services described in this documentation, which was scribed in my presence. The recorded information has been reviewed and considered for accuracy and completeness,  addended by me as needed, and agree with information above.  Signed,   Merri Ray, MD [facility] [place].  11/22/17 11:27 AM

## 2017-11-24 ENCOUNTER — Ambulatory Visit: Payer: Self-pay | Admitting: Family

## 2017-11-25 LAB — CULTURE, GROUP A STREP: Strep A Culture: NEGATIVE

## 2017-12-12 ENCOUNTER — Telehealth (INDEPENDENT_AMBULATORY_CARE_PROVIDER_SITE_OTHER): Payer: Self-pay | Admitting: Orthopedic Surgery

## 2017-12-12 NOTE — Telephone Encounter (Signed)
Patient called and left 2 messages on my vm asking for a work note for restrictions. I tried to call back ,NA and could not leave msg as "voice mail full". Patient not seen since 07/03/2016.

## 2017-12-14 IMAGING — US US EXTREM LOW VENOUS*L*
1 series · 14 of 24 positions shown · non-contrast
Comparison: None.

CLINICAL DATA: Left knee pain for 2 weeks

EXAM:
LEFT LOWER EXTREMITY VENOUS DUPLEX ULTRASOUND
TECHNIQUE: Doppler venous assessment of the left lower extremity deep venous
system was performed, including characterization of spectral flow,
compressibility, and phasicity.

[Series 1: us extrem low venous*left* · 0.08mm/px · 14 of 27 slices shown]
[im 1/27]
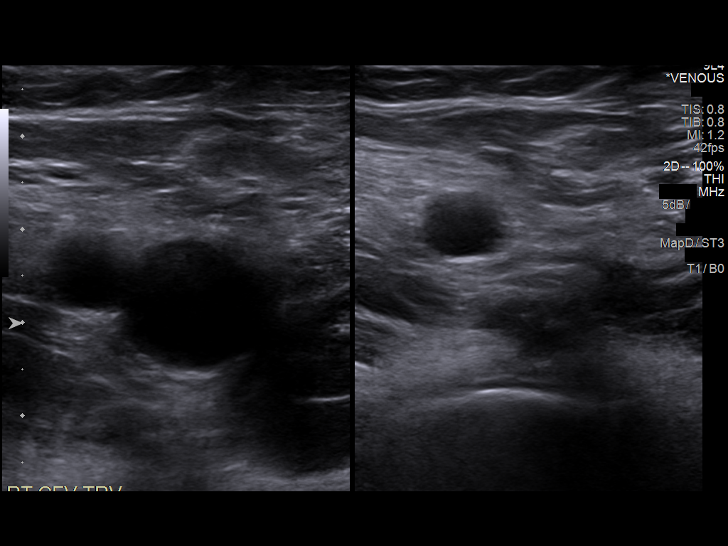
[im 3/27]
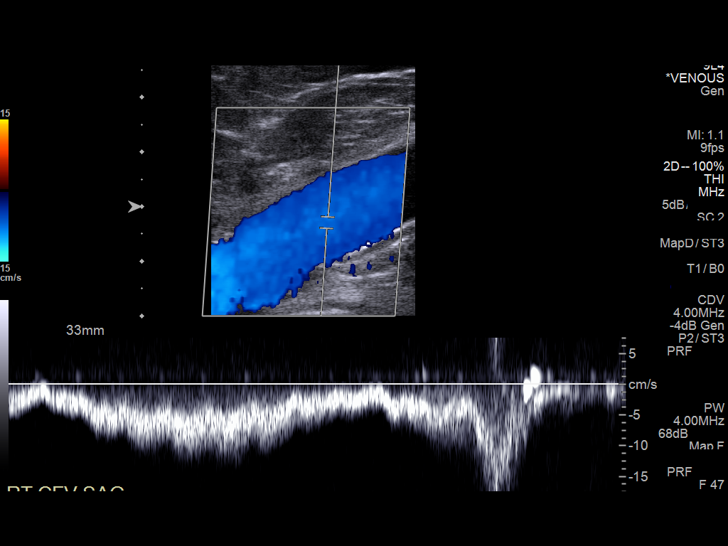
[im 5/27]
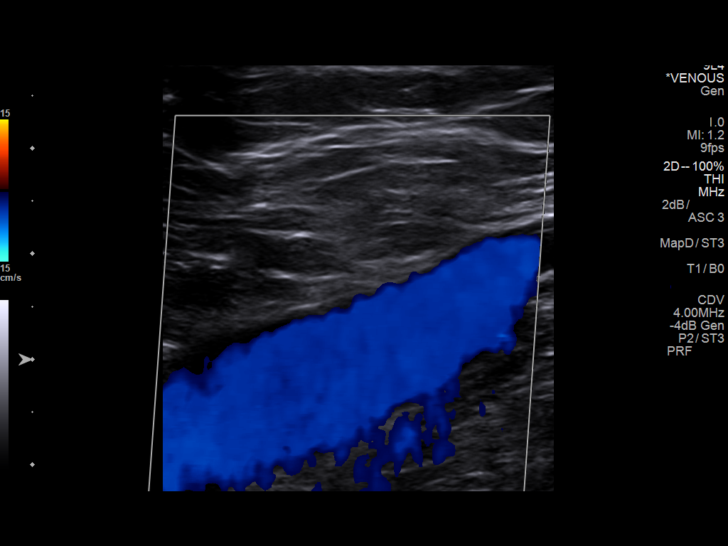
[im 7/27]
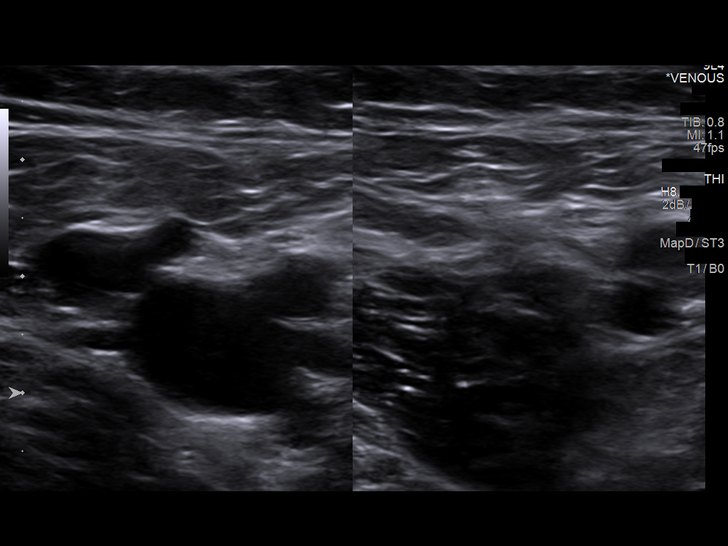
[im 8/27]
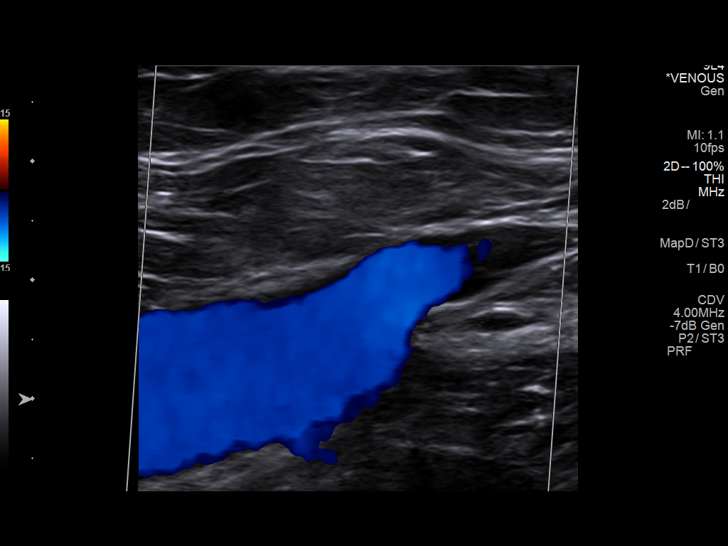
[im 11/27]
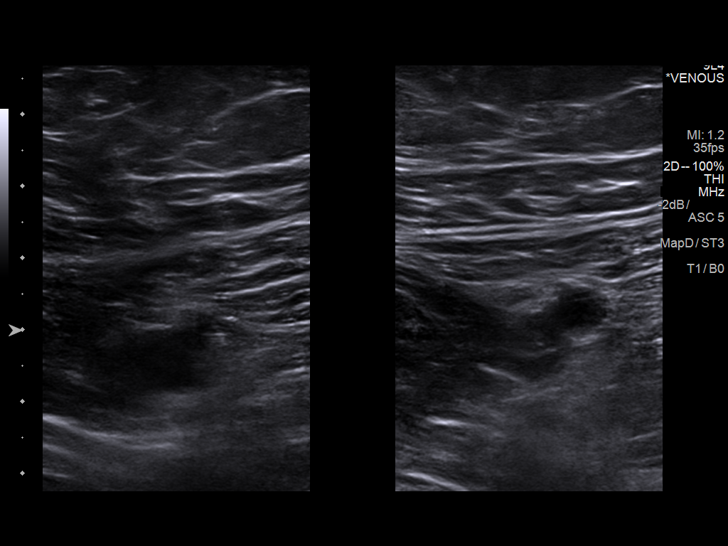
[im 13/27]
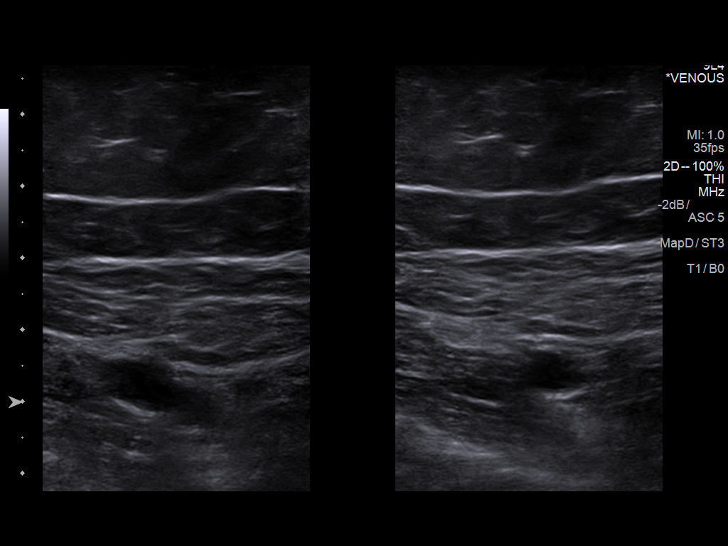
[im 14/27]
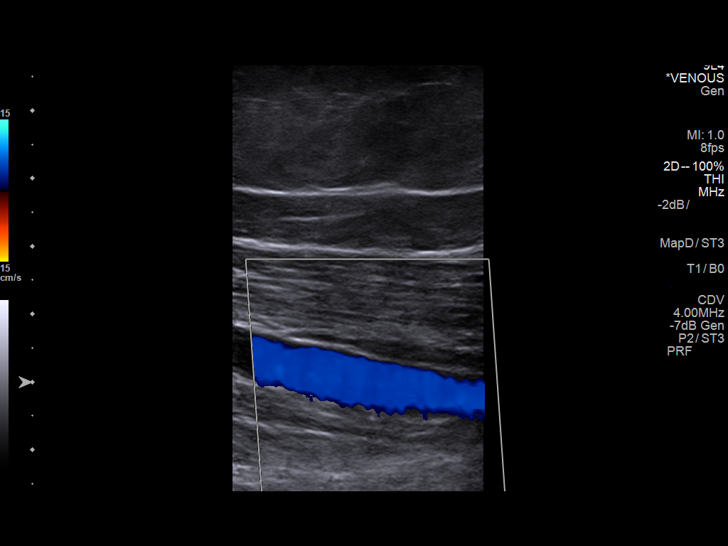
[im 16/27]
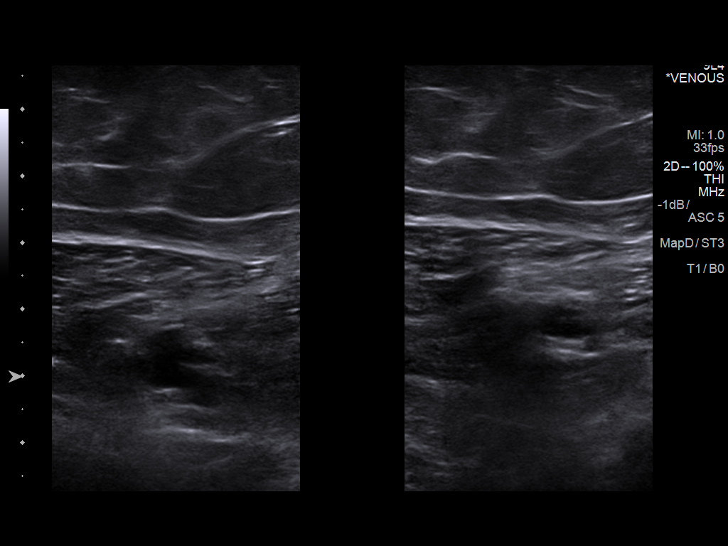
[im 19/27]
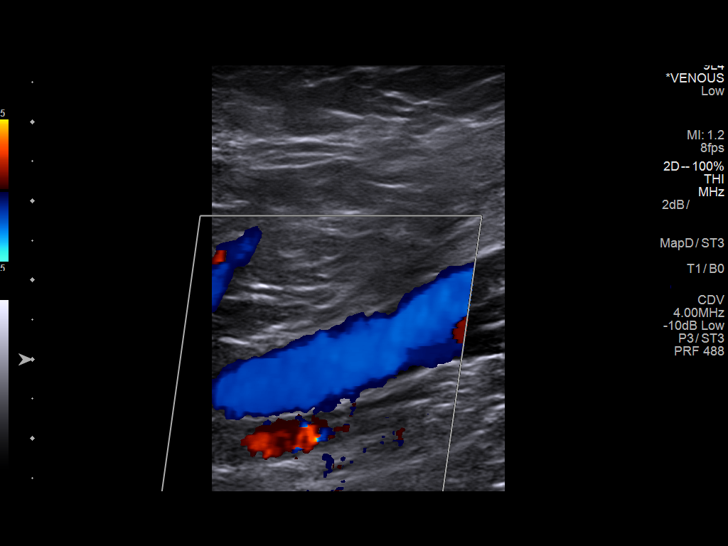
[im 21/27]
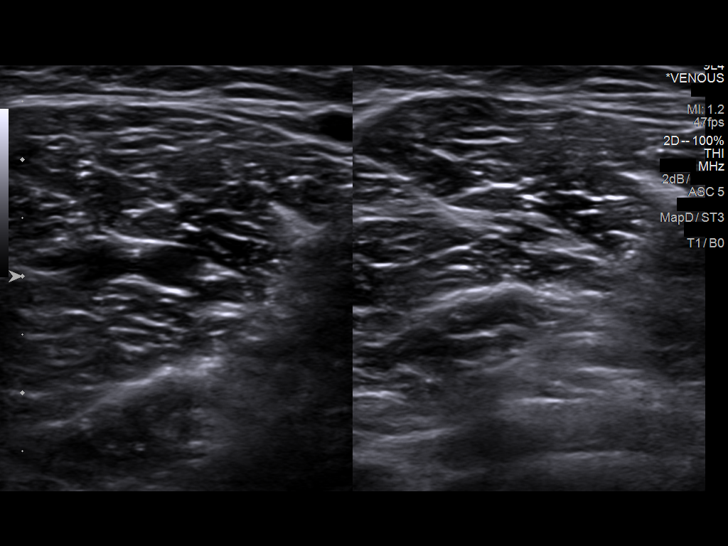
[im 22/27]
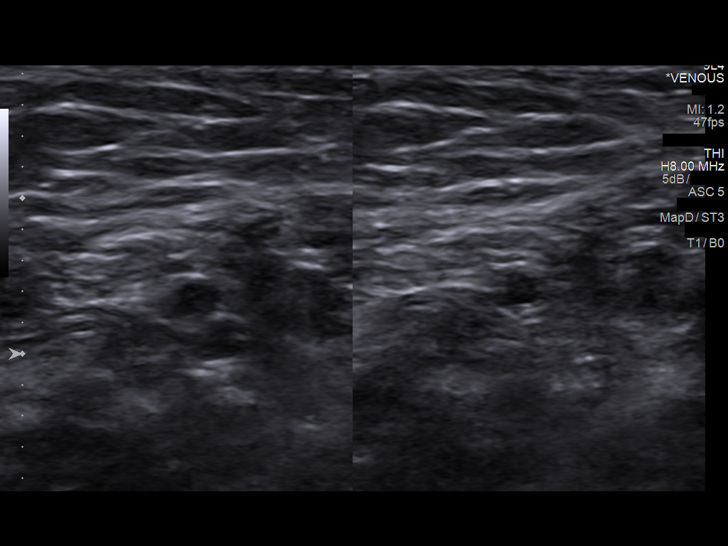
[im 24/27]
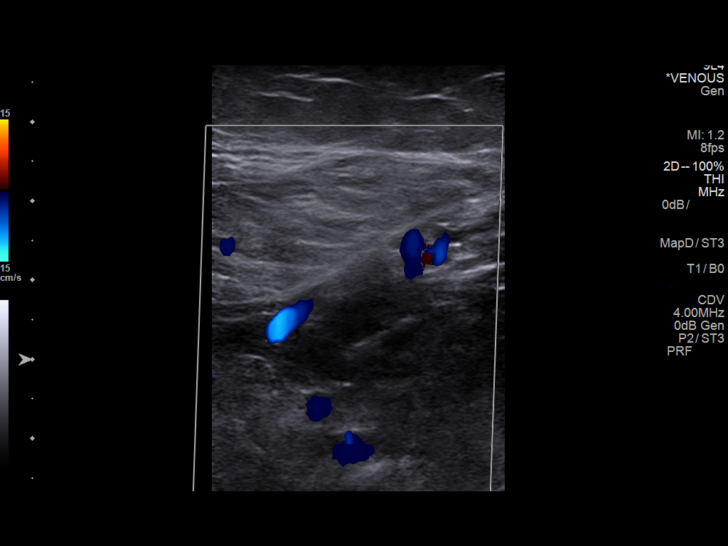
[im 27/27]
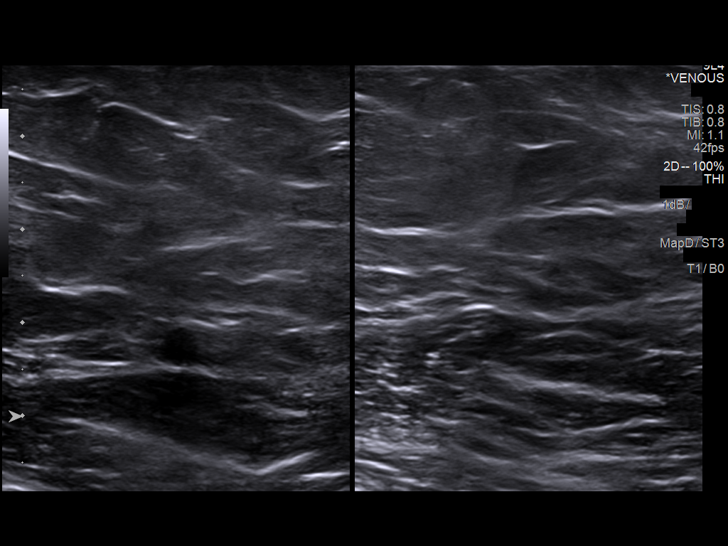

[14 of 24 positions shown; findings below may reference images not displayed]

FINDINGS: There is complete compressibility of the left common femoral,
femoral, and popliteal veins. Doppler analysis demonstrates
respiratory phasicity and augmentation of flow with calf
compression. No obvious superficial vein or calf vein thrombosis.
IMPRESSION: No evidence of left lower extremity DVT.

## 2017-12-19 ENCOUNTER — Ambulatory Visit: Payer: 59 | Admitting: Physician Assistant

## 2017-12-19 ENCOUNTER — Encounter: Payer: Self-pay | Admitting: Physician Assistant

## 2017-12-19 VITALS — BP 110/78 | HR 86 | Ht 64.0 in | Wt 252.0 lb

## 2017-12-19 DIAGNOSIS — R1013 Epigastric pain: Secondary | ICD-10-CM

## 2017-12-19 DIAGNOSIS — L83 Acanthosis nigricans: Secondary | ICD-10-CM | POA: Diagnosis not present

## 2017-12-19 DIAGNOSIS — K219 Gastro-esophageal reflux disease without esophagitis: Secondary | ICD-10-CM | POA: Diagnosis not present

## 2017-12-19 DIAGNOSIS — R112 Nausea with vomiting, unspecified: Secondary | ICD-10-CM | POA: Diagnosis not present

## 2017-12-19 MED ORDER — ONDANSETRON 4 MG PO TBDP: 4.0000 mg | ORAL_TABLET | ORAL | 2 refills | Status: DC | PRN

## 2017-12-19 MED ORDER — OMEPRAZOLE 40 MG PO CPDR: 40.0000 mg | DELAYED_RELEASE_CAPSULE | Freq: Every day | ORAL | 3 refills | Status: DC

## 2017-12-19 NOTE — Patient Instructions (Signed)
Your provider has requested that you go to the basement level for lab work before leaving today. Press "B" on the elevator. The lab is located at the first door on the left as you exit the elevator.  We have sent the following medications to your pharmacy for you to pick up at your convenience: Omeprazole 40 mg daily 30-60 minutes before breakfast  Zofran 4 mg every 4-6 hours as needed for nausea  You have been scheduled for an endoscopy. Please follow written instructions given to you at your visit today. If you use inhalers (even only as needed), please bring them with you on the day of your procedure. Your physician has requested that you go to www.startemmi.com and enter the access code given to you at your visit today. This web site gives a general overview about your procedure. However, you should still follow specific instructions given to you by our office regarding your preparation for the procedure.

## 2017-12-19 NOTE — Progress Notes (Signed)
Assessment and plans reviewed  

## 2017-12-19 NOTE — Progress Notes (Addendum)
Chief Complaint: Epigastric pain, nausea, vomiting  HPI:    Stacy Gentry is a 42 year old African American female with a past medical history as listed below, who follows with Dr. Henrene Pastor returns to clinic today for a complaint of epigastric pain, nausea and vomiting.    2013 ultrasound for upper abdominal pain which was unremarkable.    02/21/15 office visit for upper and lower GI complaints, described nausea at that time as well as burning and some abdominal cramping.  At that time Omeprazole 40 mg was started daily as well as Librax one before meals as needed.    Today, explains that she has had cyclical episodes of nausea and vomiting which have gotten worse over the past couple of years.  Interestingly, in questioning patient, she had her over-the-counter birth control stopped over the past couple years due to side effects.  Does describe worsened nausea and vomiting around her menstrual cycle.  Describes anything she eats during those 3-4 days will come back out so she maintains a liquid diet now over the past year or so.  In between menstrual cycles patient will have episodes of nausea and vomiting depending on what she eats.  Typically high-fiber foods including salads and large pieces of meat are worse.  Patient also describes waking up the next morning and vomiting food that she ate the night before.  Does describe preceding epigastric cramping.  Sometimes if she takes Zofran at that time she can prevent an episode of vomiting.  Also describes some burning pain if she does not eat for a long period of time.  Describes Omeprazole 40 mg did help with this in the past.    Denies recent lab work as she is somewhat afraid of needles and blood draws.    Denies fever, chills, blood in her stool, melena, weight loss or symptoms that awaken her at night.  Past Medical History:  Diagnosis Date  . Anxiety   . Asthma   . Bulging lumbar disc   . Depression   . Diabetes mellitus without complication  ([place])   . GERD (gastroesophageal reflux disease)   . Hypoglycemia   . Migraines    Dr.Paul Hassell Done in [place]  . PCOS (polycystic ovarian syndrome)    [facility]  . Shoulder disorder   . Syncope     Past Surgical History:  Procedure Laterality Date  . DENTAL SURGERY    . None      Current Outpatient Medications  Medication Sig Dispense Refill  . diclofenac (VOLTAREN) 75 MG EC tablet Take 1 tablet (75 mg total) by mouth 2 (two) times daily. 60 tablet 0  . SUMAtriptan (IMITREX) 6 MG/0.5ML SOLN injection Inject 0.5 mLs (6 mg total) into the skin once. May repeat once after 2 hours if not effective. 9 vial 3   Current Facility-Administered Medications  Medication Dose Route Frequency Provider Last Rate Last Dose  . betamethasone acetate-betamethasone sodium phosphate (CELESTONE) injection 3 mg  3 mg Intramuscular Once Daylene Katayama M, DPM      . betamethasone acetate-betamethasone sodium phosphate (CELESTONE) injection 3 mg  3 mg Intramuscular Once Edrick Kins, DPM        Allergies as of 12/19/2017 - Review Complete 12/19/2017  Allergen Reaction Noted  . Codeine Hives, Shortness Of Breath, and Itching 09/28/2011  . Flagyl [metronidazole] Nausea Only 04/10/2012  . Hydrocodone Itching 04/11/2011  . Oxycodone Itching 04/11/2011  . Tussionex pennkinetic er [hydrocod polst-cpm polst er]  09/28/2014  Family History  Problem Relation Age of Onset  . Diabetes Mother   . Arthritis Mother   . Breast cancer Mother 69  . Hypertension Mother   . Hypertension Father   . Diabetes Maternal Grandmother   . Arthritis Maternal Grandmother   . Diabetes Maternal Aunt   . Hyperlipidemia Paternal Aunt   . Hyperlipidemia Paternal Uncle   . Stroke Paternal Uncle   . Kidney disease Paternal Uncle   . Depression Unknown   . Breast cancer Unknown        maternal cousin  . Heart disease Paternal Uncle 52       Sudden death  . Migraines Sister     Social History   Socioeconomic  History  . Marital status: Single    Spouse name: Not on file  . Number of children: 0  . Years of education: Masters  . Highest education level: Not on file  Occupational History  . Occupation: Clinical cytogeneticist for [employer]: [place]  . Financial resource strain: Not on file  . Food insecurity:    Worry: Not on file    Inability: Not on file  . Transportation needs:    Medical: Not on file    Non-medical: Not on file  Tobacco Use  . Smoking status: Never Smoker  . Smokeless tobacco: Never Used  Substance and Sexual Activity  . Alcohol use: No    Alcohol/week: 0.0 oz    Comment: social  . Drug use: No  . Sexual activity: Not on file  Lifestyle  . Physical activity:    Days per week: Not on file    Minutes per session: Not on file  . Stress: Not on file  Relationships  . Social connections:    Talks on phone: Not on file    Gets together: Not on file    Attends religious service: Not on file    Active member of club or organization: Not on file    Attends meetings of clubs or organizations: Not on file    Relationship status: Not on file  . Intimate partner violence:    Fear of current or ex partner: Not on file    Emotionally abused: Not on file    Physically abused: Not on file    Forced sexual activity: Not on file  Other Topics Concern  . Not on file  Social History Narrative   Lives alone.   Exercise--no   Consumes about 2 cups of sweet tea week, 1 ginger ale a day     Review of Systems:    Constitutional: No weight loss, fever or chills Skin: No rash  Cardiovascular: No chest pain Respiratory: No SOB or cough Gastrointestinal: See HPI and otherwise negative Genitourinary: No dysuria  Neurological: No headache Musculoskeletal: No new muscle or joint pain Hematologic: No bleeding Psychiatric: No history of depression or anxiety   Physical Exam:  Vital signs: BP 110/78   Pulse 86   Ht 5'  4" (1.626 m)   Wt 252 lb (114.3 kg)   LMP 12/08/2017 (Exact Date)   BMI 43.26 kg/m   Constitutional:   Pleasant obese AA female appears to be in NAD, Well developed, Well nourished, alert and cooperative Head:  Normocephalic and atraumatic. Eyes:   PEERL, EOMI. No icterus. Conjunctiva pink. Ears:  Normal auditory acuity. Neck:  Supple + acanthosis nigricans Throat: Oral cavity and pharynx without inflammation, swelling or lesion.  Respiratory:  Respirations even and unlabored. Lungs clear to auscultation bilaterally.   No wheezes, crackles, or rhonchi.  Cardiovascular: Normal S1, S2. No MRG. Regular rate and rhythm. No peripheral edema, cyanosis or pallor.  Gastrointestinal:  Soft, nondistended, mild epigastric TTP no rebound or guarding. Normal bowel sounds. No appreciable masses or hepatomegaly. Psychiatric:  Demonstrates good judgement and reason without abnormal affect or behaviors.  No recent labs or imaging.   Assessment: 1.  Nausea and vomiting: Depending on diet typically, worse around menstrual cycle, worse over the past year 2 years since being taken off of birth control; consider endometriosis +/- gastritis, GERD, gastroparesis 2.  Epigastric pain: Describes pain proceeding episodes of nausea and vomiting, no recent labs in the past 4 years, acanthosis nigricans on exam ; consider gastroparesis versus GERD versus H. pylori versus other  3.  GERD: She describes a burning in her stomach when she has not eaten for hours, this is some better with Omeprazole in the past; consider gastritis  Plan: 1.  Discussed with patient that I suspect that she may have an underlying OB/GYN issue, possibly endometriosis with worsening of symptoms around her menstrual cycle.  Apparently these got worse over the past couple of years when she was taken off of birth control.  Would recommend she follow with her OB/GYN regarding this. 2.  Prescribed Omeprazole 40 mg daily, 30-60 minutes before breakfast  #30 with 3 refills 3.  Scheduled patient for an EGD in the [place] with Dr. Henrene Pastor.  Did discuss risk, benefits, limitations and alternatives and the patient agrees to proceed. 4.  Ordered a CBC, CMP, lipase and hemoglobin A1c for suspicion of possible diabetes with acanthosis nigricans and symptoms which may be related to gastroparesis 5.  Prescribed Zofran 4 mg every 4-6 hours as needed for nausea and vomiting #30 with a refill. 6.  Did briefly discuss a gastroparesis diet including low fiber foods and small meals throughout the day 7.  Patient to follow in clinic with me or Dr. Henrene Pastor per recommendations after procedure above.  Ellouise Newer, PA-C [facility] 12/19/2017, 10:10 AM  Cc: Carollee Herter, Alferd Apa, *

## 2018-01-08 ENCOUNTER — Encounter: Payer: Self-pay | Admitting: Internal Medicine

## 2018-01-22 ENCOUNTER — Encounter: Payer: 59 | Admitting: Internal Medicine

## 2018-01-26 ENCOUNTER — Encounter: Payer: Self-pay | Admitting: Internal Medicine

## 2018-01-26 ENCOUNTER — Ambulatory Visit: Payer: BLUE CROSS/BLUE SHIELD | Admitting: Internal Medicine

## 2018-01-26 VITALS — BP 132/76 | HR 94 | Temp 98.3°F | Resp 14 | Ht 64.0 in | Wt 251.4 lb

## 2018-01-26 DIAGNOSIS — S1086XA Insect bite of other specified part of neck, initial encounter: Secondary | ICD-10-CM | POA: Diagnosis not present

## 2018-01-26 DIAGNOSIS — W57XXXA Bitten or stung by nonvenomous insect and other nonvenomous arthropods, initial encounter: Secondary | ICD-10-CM

## 2018-01-26 DIAGNOSIS — G43009 Migraine without aura, not intractable, without status migrainosus: Secondary | ICD-10-CM

## 2018-01-26 DIAGNOSIS — S90862A Insect bite (nonvenomous), left foot, initial encounter: Secondary | ICD-10-CM

## 2018-01-26 MED ORDER — CEPHALEXIN 500 MG PO CAPS: 500.0000 mg | ORAL_CAPSULE | Freq: Four times a day (QID) | ORAL | 0 refills | Status: DC

## 2018-01-26 MED ORDER — SUMATRIPTAN SUCCINATE 6 MG/0.5ML ~~LOC~~ SOLN: 6.0000 mg | Freq: Once | SUBCUTANEOUS | 1 refills | Status: DC

## 2018-01-26 MED ORDER — HYDROCORTISONE 2.5 % EX CREA: TOPICAL_CREAM | Freq: Two times a day (BID) | CUTANEOUS | 0 refills | Status: DC

## 2018-01-26 NOTE — Progress Notes (Signed)
Subjective:    Patient ID: Stacy Gentry, female    DOB: Feb 04, 1976, 42 y.o.   MRN: [id number]  DOS:  01/26/2018 Type of visit - description : acute Interval history: Here with 2 concerns  During the weekend went to [place] and was exposed to mosquitoes, had some bites at the neck and left foot.  Areas are slightly itchy and getting red. Also, history of migraines, developed a headache 3 days ago and is not getting any better. Headache is similar to her previous migraines and associated to mild neck pain as usual. She think is not better because she ran out of Imitrex and request a refill. Denies this being the worst headache of her life   Review of Systems Denies any fever, chills. No LE edema or pain.   Past Medical History:  Diagnosis Date  . Anxiety   . Asthma   . Bulging lumbar disc   . Depression   . Diabetes mellitus without complication ([place])   . GERD (gastroesophageal reflux disease)   . Hypoglycemia   . Migraines    Dr.Paul Hassell Done in [place]  . PCOS (polycystic ovarian syndrome)    [facility]  . Shoulder disorder   . Syncope     Past Surgical History:  Procedure Laterality Date  . DENTAL SURGERY    . None      Social History   Socioeconomic History  . Marital status: Single    Spouse name: Not on file  . Number of children: 0  . Years of education: Masters  . Highest education level: Not on file  Occupational History  . Occupation: Clinical cytogeneticist for [employer]: [place]  . Financial resource strain: Not on file  . Food insecurity:    Worry: Not on file    Inability: Not on file  . Transportation needs:    Medical: Not on file    Non-medical: Not on file  Tobacco Use  . Smoking status: Never Smoker  . Smokeless tobacco: Never Used  Substance and Sexual Activity  . Alcohol use: No    Alcohol/week: 0.0 oz    Comment: social  . Drug use: No  . Sexual activity: Not on  file  Lifestyle  . Physical activity:    Days per week: Not on file    Minutes per session: Not on file  . Stress: Not on file  Relationships  . Social connections:    Talks on phone: Not on file    Gets together: Not on file    Attends religious service: Not on file    Active member of club or organization: Not on file    Attends meetings of clubs or organizations: Not on file    Relationship status: Not on file  . Intimate partner violence:    Fear of current or ex partner: Not on file    Emotionally abused: Not on file    Physically abused: Not on file    Forced sexual activity: Not on file  Other Topics Concern  . Not on file  Social History Narrative   Lives alone.   Exercise--no   Consumes about 2 cups of sweet tea week, 1 ginger ale a day       Allergies as of 01/26/2018      Reactions   Codeine Hives, Shortness Of Breath, Itching   Flagyl [metronidazole] Nausea Only   Hydrocodone Itching   Oxycodone Itching  Tussionex Pennkinetic Er [hydrocod Polst-cpm Polst Er]       Medication List        Accurate as of 01/26/18  2:03 PM. Always use your most recent med list.          diclofenac 75 MG EC tablet Commonly known as:  VOLTAREN Take 1 tablet (75 mg total) by mouth 2 (two) times daily.   omeprazole 40 MG capsule Commonly known as:  PRILOSEC Take 1 capsule (40 mg total) by mouth daily.   ondansetron 4 MG disintegrating tablet Commonly known as:  ZOFRAN ODT Take 1 tablet (4 mg total) by mouth every 4 (four) hours as needed for nausea or vomiting.   SUMAtriptan 6 MG/0.5ML Soln injection Commonly known as:  IMITREX Inject 0.5 mLs (6 mg total) into the skin once. May repeat once after 2 hours if not effective.          Objective:   Physical Exam  Musculoskeletal:       Back:       Feet:   BP 132/76 (BP Location: Left Wrist, Patient Position: Sitting, Cuff Size: Small)   Pulse 94   Temp 98.3 F (36.8 C) (Oral)   Resp 14   Ht 5\' 4"  (1.626 m)    Wt 251 lb 6 oz (114 kg)   SpO2 97%   BMI 43.15 kg/m  General:   Well developed, well nourished . NAD.  HEENT:  Normocephalic . Face symmetric, atraumatic Neck: Supple, full range of motion. Skin: see graphic, several areas of mild erythema around 1 x 2 cm in size, slightly hard in the center consistent with insect/mosquito bite Lower extremities: calves symmetric and nontender Neurologic:  alert & oriented X3.  Speech normal, gait appropriate for age and unassisted EOMI, pupils equal and reactive Psych--  Cognition and judgment appear intact.  Cooperative with normal attention span and concentration. Behavior appropriate. No anxious or depressed appearing.      Assessment & Plan:    42 y/o female , PMH includes asthma, HTN, migraines, obesity, PCOS presents with  Mosquito bites: Recommend local care with hydrocortisone 2.5%, early cellulitis?  Prescribed Keflex.  Call if not improving Migraine headaches: Has a persisting headache for the last 2 to 3 days, not the worst of life, patient strongly suspect this is her normal headaches simply needs a refill on Imitrex.  Refill sent.  Recommend to call anytime if the headache is not responding well to the medication or HA has different features.  She verbalized understanding.

## 2018-01-26 NOTE — Patient Instructions (Signed)
Apply hydrocortisone cream twice a day until better  Take Keflex, an antibiotic, for 5 days  If the mosquito bites are not gradually better please let me know    For your headache:  Go back on Imitrex  If you are not improving as usual let us know  Go to the ER if you have a severe or different headache

## 2018-01-26 NOTE — Progress Notes (Signed)
Pre visit review using our clinic review tool, if applicable. No additional management support is needed unless otherwise documented below in the visit note. 

## 2018-01-28 ENCOUNTER — Encounter: Payer: BLUE CROSS/BLUE SHIELD | Admitting: Internal Medicine

## 2018-01-28 ENCOUNTER — Telehealth: Payer: Self-pay | Admitting: Internal Medicine

## 2018-01-28 NOTE — Telephone Encounter (Signed)
Noted  

## 2018-02-03 ENCOUNTER — Other Ambulatory Visit: Payer: Self-pay

## 2018-02-03 DIAGNOSIS — G43009 Migraine without aura, not intractable, without status migrainosus: Secondary | ICD-10-CM

## 2018-02-03 MED ORDER — SUMATRIPTAN SUCCINATE 6 MG/0.5ML ~~LOC~~ SOLN: 6.0000 mg | SUBCUTANEOUS | 1 refills | Status: DC

## 2018-02-19 ENCOUNTER — Other Ambulatory Visit: Payer: Self-pay

## 2018-02-19 ENCOUNTER — Encounter: Payer: Self-pay | Admitting: Family Medicine

## 2018-02-19 ENCOUNTER — Ambulatory Visit: Payer: 59 | Admitting: Family Medicine

## 2018-02-19 VITALS — BP 124/80 | HR 100 | Temp 99.7°F | Ht 63.98 in | Wt 247.4 lb

## 2018-02-19 DIAGNOSIS — W57XXXD Bitten or stung by nonvenomous insect and other nonvenomous arthropods, subsequent encounter: Secondary | ICD-10-CM | POA: Diagnosis not present

## 2018-02-19 DIAGNOSIS — S90862D Insect bite (nonvenomous), left foot, subsequent encounter: Secondary | ICD-10-CM | POA: Diagnosis not present

## 2018-02-19 DIAGNOSIS — R208 Other disturbances of skin sensation: Secondary | ICD-10-CM

## 2018-02-19 MED ORDER — DICLOFENAC SODIUM 1 % TD GEL: 2.0000 g | Freq: Two times a day (BID) | TRANSDERMAL | 1 refills | Status: AC | PRN

## 2018-02-19 MED ORDER — GABAPENTIN 100 MG PO CAPS: ORAL_CAPSULE | ORAL | 0 refills | Status: DC

## 2018-02-19 NOTE — Progress Notes (Signed)
Chief Complaint  Patient presents with  . Pain    Having pain in the left foot. Was told it was a spider bit or mosquito. Took the medication with no resolve    HPI   Pt was seen on 01/26/18 by Dr. Larose Kells  She reports that the only difference in her foot is that her puncture wounds have healed but the foot is still itching and burning The swelling across the top of the foot has also reduced She completed Keflex and keeps the foot elevated She reports that she has itching and burning which wakes her up from her sleep She is using topical cream without any help.  In the day time she gets mostly swelling She gets pain with dorsiflexion and there is some pain. She tried cortisone cream and benadryl insect bite spray and topical alcohol rubs.   Past Medical History:  Diagnosis Date  . Anxiety   . Asthma   . Bulging lumbar disc   . Depression   . Diabetes mellitus without complication ([place])   . GERD (gastroesophageal reflux disease)   . Hypoglycemia   . Migraines    Dr.Paul Hassell Done in [place]  . PCOS (polycystic ovarian syndrome)    [facility]  . Shoulder disorder   . Syncope     Current Outpatient Medications  Medication Sig Dispense Refill  . diclofenac (VOLTAREN) 75 MG EC tablet Take 1 tablet (75 mg total) by mouth 2 (two) times daily. 60 tablet 0  . diclofenac sodium (VOLTAREN) 1 % GEL Apply 2 g topically 2 (two) times daily as needed. 100 g 1  . gabapentin (NEURONTIN) 100 MG capsule Take one capsule by mouth at bedtime. Increase nightly until you are taking 3 capsules at bedtime. 90 capsule 0  . omeprazole (PRILOSEC) 40 MG capsule Take 1 capsule (40 mg total) by mouth daily. 30 capsule 3  . ondansetron (ZOFRAN ODT) 4 MG disintegrating tablet Take 1 tablet (4 mg total) by mouth every 4 (four) hours as needed for nausea or vomiting. 30 tablet 2  . SUMAtriptan (IMITREX) 6 MG/0.5ML SOLN injection Inject 0.5 mLs (6 mg total) into the skin as directed for 1 dose. May repeat once  after 2 hours if not effective. 9 mL 1   Current Facility-Administered Medications  Medication Dose Route Frequency Provider Last Rate Last Dose  . betamethasone acetate-betamethasone sodium phosphate (CELESTONE) injection 3 mg  3 mg Intramuscular Once Daylene Katayama M, DPM      . betamethasone acetate-betamethasone sodium phosphate (CELESTONE) injection 3 mg  3 mg Intramuscular Once Edrick Kins, DPM        Allergies:  Allergies  Allergen Reactions  . Codeine Hives, Shortness Of Breath and Itching  . Flagyl [Metronidazole] Nausea Only  . Hydrocodone Itching  . Oxycodone Itching  . Tussionex Pennkinetic Er [Hydrocod Polst-Cpm Polst Er]     Past Surgical History:  Procedure Laterality Date  . DENTAL SURGERY    . None      Social History   Socioeconomic History  . Marital status: Single    Spouse name: Not on file  . Number of children: 0  . Years of education: Masters  . Highest education level: Not on file  Occupational History  . Occupation: Clinical cytogeneticist for [employer]: [place]  . Financial resource strain: Not on file  . Food insecurity:    Worry: Not on file    Inability: Not on  file  . Transportation needs:    Medical: Not on file    Non-medical: Not on file  Tobacco Use  . Smoking status: Never Smoker  . Smokeless tobacco: Never Used  Substance and Sexual Activity  . Alcohol use: No    Alcohol/week: 0.0 oz    Comment: social  . Drug use: No  . Sexual activity: Not on file  Lifestyle  . Physical activity:    Days per week: Not on file    Minutes per session: Not on file  . Stress: Not on file  Relationships  . Social connections:    Talks on phone: Not on file    Gets together: Not on file    Attends religious service: Not on file    Active member of club or organization: Not on file    Attends meetings of clubs or organizations: Not on file    Relationship status: Not on file  Other  Topics Concern  . Not on file  Social History Narrative   Lives alone.   Exercise--no   Consumes about 2 cups of sweet tea week, 1 ginger ale a day     Family History  Problem Relation Age of Onset  . Diabetes Mother   . Arthritis Mother   . Breast cancer Mother 71  . Hypertension Mother   . Hypertension Father   . Diabetes Maternal Grandmother   . Arthritis Maternal Grandmother   . Diabetes Maternal Aunt   . Hyperlipidemia Paternal Aunt   . Hyperlipidemia Paternal Uncle   . Stroke Paternal Uncle   . Kidney disease Paternal Uncle   . Depression Unknown   . Breast cancer Unknown        maternal cousin  . Heart disease Paternal Uncle 30       Sudden death  . Migraines Sister      ROS Review of Systems See HPI Constitution: No fevers or chills No malaise No diaphoresis Skin: No rash or itching Eyes: no blurry vision, no double vision GU: no dysuria or hematuria Neuro: no dizziness or headaches all others reviewed and negative   Objective: Vitals:   02/19/18 1040  BP: 124/80  Pulse: 100  Temp: 99.7 F (37.6 C)  TempSrc: Oral  SpO2: 99%  Weight: 247 lb 6.4 oz (112.2 kg)  Height: 5' 3.98" (1.625 m)    Physical Exam  Constitutional: She is oriented to person, place, and time. She appears well-developed and well-nourished.  HENT:  Head: Normocephalic and atraumatic.  Eyes: Conjunctivae and EOM are normal.  Pulmonary/Chest: Effort normal.  Neurological: She is alert and oriented to person, place, and time.  Skin: Skin is warm. Capillary refill takes less than 2 seconds.  Psychiatric: She has a normal mood and affect. Her behavior is normal. Judgment and thought content normal.    Left foot with mild soft tissue swelling on the dorsum and near the achilles tendon 3 discrete puncture marks that are well healed on the dorsum and 2 puncture marks near the heel of the left foot Cap refill <2s Warm No erythema No vesicles  Assessment and Plan Stacy Gentry was seen  today for pain.  Diagnoses and all orders for this visit:  Insect bite of left foot, subsequent encounter -     diclofenac sodium (VOLTAREN) 1 % GEL; Apply 2 g topically 2 (two) times daily as needed. -     gabapentin (NEURONTIN) 100 MG capsule; Take one capsule by mouth at bedtime. Increase nightly until you  are taking 3 capsules at bedtime.  Burning sensation of foot -     diclofenac sodium (VOLTAREN) 1 % GEL; Apply 2 g topically 2 (two) times daily as needed. -     gabapentin (NEURONTIN) 100 MG capsule; Take one capsule by mouth at bedtime. Increase nightly until you are taking 3 capsules at bedtime.   Continue to keep elevated and ice when swollen Take use voltaren for pain  Try gabapentin for burning pain Discussed side effects of gabapentin  follow up with up with PCP in one month    [place]

## 2018-02-19 NOTE — Patient Instructions (Addendum)
   IF you received an x-ray today, you will receive an invoice from [facility]. Please contact [facility] at [phone] with questions or concerns regarding your invoice.   IF you received labwork today, you will receive an invoice from [facility]. Please contact [facility] at [phone] with questions or concerns regarding your invoice.   Our billing staff will not be able to assist you with questions regarding bills from these companies.  You will be contacted with the lab results as soon as they are available. The fastest way to get your results is to activate your My Chart account. Instructions are located on the last page of this paperwork. If you have not heard from us regarding the results in 2 weeks, please contact this office.      Insect Bite, Adult An insect bite can make your skin red, itchy, and swollen. An insect bite is different from an insect sting, which happens when an insect injects poison (venom) into the skin. Some insects can spread disease to people through a bite. However, most insect bites do not lead to disease and are not serious. What are the causes? Insects may bite for a variety of reasons, including:  Hunger.  To defend themselves.  Insects that bite include:  Spiders.  Mosquitoes.  Ticks.  Fleas.  Ants.  Flies.  Bedbugs.  What are the signs or symptoms? Symptoms of this condition include:  Itching or pain in the bite area.  Redness and swelling in the bite area.  An open wound (skin ulcer).  In many cases, symptoms last for 2-4 days. How is this diagnosed? This condition is usually diagnosed based on symptoms and a physical exam. How is this treated? Treatment is usually not needed. Symptoms often go away on their own. When treatment is recommended, it may involve:  Applying a cream or lotion to the bitten area. This treatment helps with itching.  Taking an antibiotic medicine. This treatment is needed if  the bite area gets infected.  Getting a tetanus shot.  Applying ice to the affected area.  Medicines called antihistamines. This treatment is needed if you develop an allergic reaction to the insect bite.  Follow these instructions at home: Bite area care  Do not scratch the bite area.  Keep the bite area clean and dry. Wash it every day with soap and water as told by your health care provider.  Check the bite area every day for signs of infection. Check for: ? More redness, swelling, or pain. ? Fluid or blood. ? Warmth. ? Pus. Managing pain, itching, and swelling   You may apply a baking soda paste, cortisone cream, or calamine lotion to the bite area as told by your health care provider.  If directed, applyice to the bite area. ? Put ice in a plastic bag. ? Place a towel between your skin and the bag. ? Leave the ice on for 20 minutes, 2-3 times per day. Medicines  Apply or take over-the-counter and prescription medicines only as told by your health care provider.  If you were prescribed an antibiotic medicine, use it as told by your health care provider. Do not stop using the antibiotic even if your condition improves. General instructions  Keep all follow-up visits as told by your health care provider. This is important. How is this prevented? To help reduce your risk of insect bites:  When you are outdoors, wear clothing that covers your arms and legs.  Use insect repellent. The best   insect repellents contain: ? DEET, picaridin, oil of lemon eucalyptus (OLE), or IR3535. ? Higher amounts of an active ingredient.  If your home windows do not have screens, consider installing them.  Contact a health care provider if:  You have more redness, swelling, or pain in the bite area.  You have fluid, blood, or pus coming from the bite area.  The bite area feels warm to the touch.  You have a fever. Get help right away if:  You have joint pain.  You have a  rash.  You have shortness of breath.  You feel unusually tired or sleepy.  You have neck pain.  You have a headache.  You have unusual weakness.  You have chest pain.  You have nausea, vomiting, or pain in the abdomen. This information is not intended to replace advice given to you by your health care provider. Make sure you discuss any questions you have with your health care provider. Document Released: 10/10/2004 Document Revised: 05/01/2016 Document Reviewed: 03/11/2016 Elsevier Interactive Patient Education  2018 Elsevier Inc.  

## 2018-02-21 ENCOUNTER — Other Ambulatory Visit: Payer: Self-pay | Admitting: Family Medicine

## 2018-02-21 DIAGNOSIS — S90862D Insect bite (nonvenomous), left foot, subsequent encounter: Secondary | ICD-10-CM

## 2018-02-21 DIAGNOSIS — W57XXXD Bitten or stung by nonvenomous insect and other nonvenomous arthropods, subsequent encounter: Secondary | ICD-10-CM

## 2018-02-21 DIAGNOSIS — R208 Other disturbances of skin sensation: Secondary | ICD-10-CM

## 2018-02-23 NOTE — Telephone Encounter (Signed)
Pharmacy requesting 90 day supply of prescription. Prescription recently filled on 02/19/18 #90 by Dr. Nolon Rod.

## 2018-02-24 ENCOUNTER — Other Ambulatory Visit: Payer: Self-pay | Admitting: Family Medicine

## 2018-02-24 DIAGNOSIS — S90862D Insect bite (nonvenomous), left foot, subsequent encounter: Secondary | ICD-10-CM

## 2018-02-24 DIAGNOSIS — R208 Other disturbances of skin sensation: Secondary | ICD-10-CM

## 2018-02-24 DIAGNOSIS — W57XXXD Bitten or stung by nonvenomous insect and other nonvenomous arthropods, subsequent encounter: Secondary | ICD-10-CM

## 2018-02-25 NOTE — Telephone Encounter (Signed)
Unable to leave message voice box full   Please advise patient her medication is denied for diclofenac gel.  Spoke with Dr. Nolon Rod and she may buy OTC aspecream w/lidocane.

## 2018-03-19 ENCOUNTER — Other Ambulatory Visit: Payer: Self-pay | Admitting: Family Medicine

## 2018-03-19 DIAGNOSIS — W57XXXD Bitten or stung by nonvenomous insect and other nonvenomous arthropods, subsequent encounter: Secondary | ICD-10-CM

## 2018-03-19 DIAGNOSIS — R208 Other disturbances of skin sensation: Secondary | ICD-10-CM

## 2018-03-19 DIAGNOSIS — S90862D Insect bite (nonvenomous), left foot, subsequent encounter: Secondary | ICD-10-CM

## 2018-03-20 NOTE — Telephone Encounter (Signed)
Refill request: Gabapentin 100 mg; last refill 02/19/18; #90; no refills (per Dr. Nolon Rod)  Last office visit: 01/26/18  PCP: Dr. Carollee Herter  Pharm[facility]: [facility] [address].   Nurse Triage sending this back to office for Dr. Carollee Herter to review, as this medication was initiated by Dr. Nolon Rod.

## 2018-03-20 NOTE — Telephone Encounter (Signed)
Gabapentin rx request, see PEC note.

## 2018-03-23 ENCOUNTER — Other Ambulatory Visit: Payer: Self-pay | Admitting: Family Medicine

## 2018-03-23 DIAGNOSIS — R208 Other disturbances of skin sensation: Secondary | ICD-10-CM

## 2018-03-23 DIAGNOSIS — W57XXXD Bitten or stung by nonvenomous insect and other nonvenomous arthropods, subsequent encounter: Secondary | ICD-10-CM

## 2018-03-23 DIAGNOSIS — S90862D Insect bite (nonvenomous), left foot, subsequent encounter: Secondary | ICD-10-CM

## 2018-03-23 MED ORDER — GABAPENTIN 100 MG PO CAPS: ORAL_CAPSULE | ORAL | 3 refills | Status: DC

## 2018-03-25 ENCOUNTER — Other Ambulatory Visit: Payer: Self-pay | Admitting: Family Medicine

## 2018-03-25 NOTE — Telephone Encounter (Signed)
Copied from [place] [id number]. Topic: Quick Communication - See Telephone Encounter >> Mar 25, 2018 11:57 AM Mylinda Latina, NT wrote: CRM for notification. See Telephone encounter for: 03/25/18. Patient called and states she needs a refill of diclofenac (VOLTAREN) 75 MG EC tablet   [id number] [place], [place] - [phone] (Phone) [phone] (Fax)

## 2018-03-25 NOTE — Telephone Encounter (Signed)
LOV 02/19/18 Dr. Carollee Herter

## 2018-03-26 MED ORDER — DICLOFENAC SODIUM 75 MG PO TBEC: 75.0000 mg | DELAYED_RELEASE_TABLET | Freq: Two times a day (BID) | ORAL | 4 refills | Status: AC

## 2018-04-23 ENCOUNTER — Other Ambulatory Visit: Payer: Self-pay | Admitting: Physician Assistant

## 2018-05-11 ENCOUNTER — Encounter: Payer: Self-pay | Admitting: Family Medicine

## 2018-05-11 ENCOUNTER — Ambulatory Visit: Payer: PRIVATE HEALTH INSURANCE | Admitting: Family Medicine

## 2018-05-11 VITALS — BP 116/73 | HR 98 | Temp 98.8°F | Resp 16 | Ht 64.0 in | Wt 249.6 lb

## 2018-05-11 DIAGNOSIS — N611 Abscess of the breast and nipple: Secondary | ICD-10-CM

## 2018-05-11 DIAGNOSIS — B354 Tinea corporis: Secondary | ICD-10-CM | POA: Diagnosis not present

## 2018-05-11 MED ORDER — DOXYCYCLINE HYCLATE 100 MG PO TABS: 100.0000 mg | ORAL_TABLET | Freq: Two times a day (BID) | ORAL | 0 refills | Status: DC

## 2018-05-11 MED ORDER — NYSTATIN 100000 UNIT/GM EX POWD: Freq: Four times a day (QID) | CUTANEOUS | 0 refills | Status: DC

## 2018-05-11 NOTE — Patient Instructions (Signed)
Skin Abscess A skin abscess is an infected area on or under your skin that contains pus and other material. An abscess can happen almost anywhere on your body. Some abscesses break open (rupture) on their own. Most continue to get worse unless they are treated. The infection can spread deeper into the body and into your blood, which can make you feel sick. Treatment usually involves draining the abscess. Follow these instructions at home: Abscess Care  If you have an abscess that has not drained, place a warm, clean, wet washcloth over the abscess several times a day. Do this as told by your doctor.  Follow instructions from your doctor about how to take care of your abscess. Make sure you: ? Cover the abscess with a bandage (dressing). ? Change your bandage or gauze as told by your doctor. ? Wash your hands with soap and water before you change the bandage or gauze. If you cannot use soap and water, use hand sanitizer.  Check your abscess every day for signs that the infection is getting worse. Check for: ? More redness, swelling, or pain. ? More fluid or blood. ? Warmth. ? More pus or a bad smell. Medicines   Take over-the-counter and prescription medicines only as told by your doctor.  If you were prescribed an antibiotic medicine, take it as told by your doctor. Do not stop taking the antibiotic even if you start to feel better. General instructions  To avoid spreading the infection: ? Do not share personal care items, towels, or hot tubs with others. ? Avoid making skin-to-skin contact with other people.  Keep all follow-up visits as told by your doctor. This is important. Contact a doctor if:  You have more redness, swelling, or pain around your abscess.  You have more fluid or blood coming from your abscess.  Your abscess feels warm when you touch it.  You have more pus or a bad smell coming from your abscess.  You have a fever.  Your muscles ache.  You have  chills.  You feel sick. Get help right away if:  You have very bad (severe) pain.  You see red streaks on your skin spreading away from the abscess. This information is not intended to replace advice given to you by your health care provider. Make sure you discuss any questions you have with your health care provider. Document Released: 02/19/2008 Document Revised: 04/28/2016 Document Reviewed: 07/12/2015 Elsevier Interactive Patient Education  2018 Elsevier Inc.  

## 2018-05-11 NOTE — Progress Notes (Signed)
Patient ID: Stacy Gentry, female   DOB: Sep 14, 1976, 42 y.o.   MRN: [id number]     Subjective:  I acted as a Education administrator for Dr. Carollee Herter.  Guerry Bruin, [place]   Patient ID: Stacy Gentry, female    DOB: 1976-06-02, 42 y.o.   MRN: [id number]  Chief Complaint  Patient presents with  . Breast Pain    left side    HPI  Patient is in today for breast pain left side.  She has a boil under L breast and itching from yeast.  No other complaints   Patient Care Team: Carollee Herter, Alferd Apa, DO as PCP - General (Family Medicine)   Past Medical History:  Diagnosis Date  . Anxiety   . Asthma   . Bulging lumbar disc   . Depression   . Diabetes mellitus without complication ([place])   . GERD (gastroesophageal reflux disease)   . Hypoglycemia   . Migraines    Dr.Paul Hassell Done in [place]  . PCOS (polycystic ovarian syndrome)    [facility]  . Shoulder disorder   . Syncope     Past Surgical History:  Procedure Laterality Date  . DENTAL SURGERY    . None      Family History  Problem Relation Age of Onset  . Diabetes Mother   . Arthritis Mother   . Breast cancer Mother 52  . Hypertension Mother   . Hypertension Father   . Diabetes Maternal Grandmother   . Arthritis Maternal Grandmother   . Diabetes Maternal Aunt   . Hyperlipidemia Paternal Aunt   . Hyperlipidemia Paternal Uncle   . Stroke Paternal Uncle   . Kidney disease Paternal Uncle   . Depression Unknown   . Breast cancer Unknown        maternal cousin  . Heart disease Paternal Uncle 21       Sudden death  . Migraines Sister     Social History   Socioeconomic History  . Marital status: Single    Spouse name: Not on file  . Number of children: 0  . Years of education: Masters  . Highest education level: Not on file  Occupational History  . Occupation: Clinical cytogeneticist for [employer]: [place]  . Financial resource strain: Not on file  . Food  insecurity:    Worry: Not on file    Inability: Not on file  . Transportation needs:    Medical: Not on file    Non-medical: Not on file  Tobacco Use  . Smoking status: Never Smoker  . Smokeless tobacco: Never Used  Substance and Sexual Activity  . Alcohol use: No    Alcohol/week: 0.0 standard drinks    Comment: social  . Drug use: No  . Sexual activity: Not on file  Lifestyle  . Physical activity:    Days per week: Not on file    Minutes per session: Not on file  . Stress: Not on file  Relationships  . Social connections:    Talks on phone: Not on file    Gets together: Not on file    Attends religious service: Not on file    Active member of club or organization: Not on file    Attends meetings of clubs or organizations: Not on file    Relationship status: Not on file  . Intimate partner violence:    Fear of current or ex partner: Not on file  Emotionally abused: Not on file    Physically abused: Not on file    Forced sexual activity: Not on file  Other Topics Concern  . Not on file  Social History Narrative   Lives alone.   Exercise--no   Consumes about 2 cups of sweet tea week, 1 ginger ale a day     Outpatient Medications Prior to Visit  Medication Sig Dispense Refill  . diclofenac (VOLTAREN) 75 MG EC tablet Take 1 tablet (75 mg total) by mouth 2 (two) times daily. 60 tablet 4  . diclofenac sodium (VOLTAREN) 1 % GEL Apply 2 g topically 2 (two) times daily as needed. 100 g 1  . gabapentin (NEURONTIN) 100 MG capsule Take one capsule by mouth at bedtime. Increase nightly until you are taking 3 capsules at bedtime. 270 capsule 3  . omeprazole (PRILOSEC) 40 MG capsule TAKE 1 CAPSULE BY MOUTH EVERY DAY 90 capsule 1  . ondansetron (ZOFRAN ODT) 4 MG disintegrating tablet Take 1 tablet (4 mg total) by mouth every 4 (four) hours as needed for nausea or vomiting. 30 tablet 2  . SUMAtriptan (IMITREX) 6 MG/0.5ML SOLN injection Inject 0.5 mLs (6 mg total) into the skin as  directed for 1 dose. May repeat once after 2 hours if not effective. 9 mL 1   Facility-Administered Medications Prior to Visit  Medication Dose Route Frequency Provider Last Rate Last Dose  . betamethasone acetate-betamethasone sodium phosphate (CELESTONE) injection 3 mg  3 mg Intramuscular Once Daylene Katayama M, DPM      . betamethasone acetate-betamethasone sodium phosphate (CELESTONE) injection 3 mg  3 mg Intramuscular Once Edrick Kins, DPM        Allergies  Allergen Reactions  . Codeine Hives, Shortness Of Breath and Itching  . Flagyl [Metronidazole] Nausea Only  . Hydrocodone Itching  . Oxycodone Itching  . Tussionex Pennkinetic Er [Hydrocod Polst-Cpm Polst Er]     Review of Systems  Constitutional: Negative for fever and malaise/fatigue.  HENT: Negative for congestion.   Eyes: Negative for blurred vision.  Respiratory: Negative for cough and shortness of breath.   Cardiovascular: Negative for chest pain, palpitations and leg swelling.  Gastrointestinal: Negative for vomiting.  Musculoskeletal: Negative for back pain.  Skin: Negative for rash.  Neurological: Negative for loss of consciousness and headaches.       Objective:    Physical Exam  Skin: Rash noted. Rash is macular.     Nursing note and vitals reviewed.   BP 116/73 (BP Location: Right Arm, Cuff Size: Normal)   Pulse 98   Temp 98.8 F (37.1 C) (Oral)   Resp 16   Ht 5\' 4"  (1.626 m)   Wt 249 lb 9.6 oz (113.2 kg)   LMP 05/01/2018   SpO2 100%   BMI 42.84 kg/m  Wt Readings from Last 3 Encounters:  05/11/18 249 lb 9.6 oz (113.2 kg)  02/19/18 247 lb 6.4 oz (112.2 kg)  01/26/18 251 lb 6 oz (114 kg)   BP Readings from Last 3 Encounters:  05/11/18 116/73  02/19/18 124/80  01/26/18 132/76      There is no immunization history on file for this patient.  Health Maintenance  Topic Date Due  . HIV Screening  07/30/1991  . PAP SMEAR  04/24/2014  . TETANUS/TDAP  11/21/2016  . INFLUENZA VACCINE   04/16/2018    Lab Results  Component Value Date   WBC 16.6 (H) 08/17/2014   HGB 12.5 08/17/2014   HCT 37.7 08/17/2014  PLT 337 08/17/2014   GLUCOSE 155 (H) 08/17/2014   ALT 10 03/07/2014   AST 22 03/07/2014   NA 139 08/17/2014   K 3.5 (L) 08/17/2014   CL 99 08/17/2014   CREATININE 0.87 08/17/2014   BUN 10 08/17/2014   CO2 23 08/17/2014   TSH 0.51 07/25/2011   HGBA1C 5.8 04/11/2011    Lab Results  Component Value Date   TSH 0.51 07/25/2011   Lab Results  Component Value Date   WBC 16.6 (H) 08/17/2014   HGB 12.5 08/17/2014   HCT 37.7 08/17/2014   MCV 82.9 08/17/2014   PLT 337 08/17/2014   Lab Results  Component Value Date   NA 139 08/17/2014   K 3.5 (L) 08/17/2014   CO2 23 08/17/2014   GLUCOSE 155 (H) 08/17/2014   BUN 10 08/17/2014   CREATININE 0.87 08/17/2014   BILITOT 0.3 03/07/2014   ALKPHOS 64 03/07/2014   AST 22 03/07/2014   ALT 10 03/07/2014   PROT 8.3 03/07/2014   ALBUMIN 4.2 03/07/2014   CALCIUM 9.3 08/17/2014   ANIONGAP 17 (H) 08/17/2014   GFR 105.16 04/11/2011   No results found for: CHOL No results found for: HDL No results found for: LDLCALC No results found for: TRIG No results found for: CHOLHDL Lab Results  Component Value Date   HGBA1C 5.8 04/11/2011         Assessment & Plan:   Problem List Items Addressed This Visit    None    Visit Diagnoses    Boil, breast    -  Primary   Relevant Medications   doxycycline (VIBRA-TABS) 100 MG tablet   nystatin (MYCOSTATIN/NYSTOP) powder   Tinea corporis       Relevant Medications   nystatin (MYCOSTATIN/NYSTOP) powder    rto prn   I am having Seletha M. Tyler start on doxycycline and nystatin. I am also having her maintain her ondansetron, SUMAtriptan, diclofenac sodium, gabapentin, diclofenac, and omeprazole. We will continue to administer betamethasone acetate-betamethasone sodium phosphate and betamethasone acetate-betamethasone sodium phosphate.  Meds ordered this encounter    Medications  . doxycycline (VIBRA-TABS) 100 MG tablet    Sig: Take 1 tablet (100 mg total) by mouth 2 (two) times daily.    Dispense:  20 tablet    Refill:  0  . nystatin (MYCOSTATIN/NYSTOP) powder    Sig: Apply topically 4 (four) times daily.    Dispense:  15 g    Refill:  0    CMA served as scribe during this visit. History, Physical and Plan performed by medical provider. Documentation and orders reviewed and attested to.  Ann Held, DO

## 2018-06-04 ENCOUNTER — Ambulatory Visit (INDEPENDENT_AMBULATORY_CARE_PROVIDER_SITE_OTHER): Payer: PRIVATE HEALTH INSURANCE | Admitting: Medical

## 2018-06-04 ENCOUNTER — Encounter: Payer: Self-pay | Admitting: Medical

## 2018-06-04 VITALS — BP 129/86 | HR 121 | Temp 99.1°F | Resp 16 | Ht 64.0 in | Wt 250.4 lb

## 2018-06-04 DIAGNOSIS — B354 Tinea corporis: Secondary | ICD-10-CM | POA: Diagnosis not present

## 2018-06-04 DIAGNOSIS — R350 Frequency of micturition: Secondary | ICD-10-CM | POA: Diagnosis not present

## 2018-06-04 LAB — POC URINALSYSI DIPSTICK (AUTOMATED)
Bilirubin, UA: NEGATIVE
Blood, UA: NEGATIVE
Glucose, UA: NEGATIVE
KETONES UA: NEGATIVE
Leukocytes, UA: NEGATIVE
Nitrite, UA: POSITIVE
PH UA: 6 (ref 5.0–8.0)
PROTEIN UA: NEGATIVE
SPEC GRAV UA: 1.02 (ref 1.010–1.025)
Urobilinogen, UA: NEGATIVE E.U./dL — AB

## 2018-06-04 MED ORDER — NITROFURANTOIN MONOHYD MACRO 100 MG PO CAPS: 100.0000 mg | ORAL_CAPSULE | Freq: Two times a day (BID) | ORAL | 0 refills | Status: DC

## 2018-06-04 MED ORDER — NYSTATIN 100000 UNIT/GM EX POWD: Freq: Four times a day (QID) | CUTANEOUS | 0 refills | Status: DC

## 2018-06-04 NOTE — Progress Notes (Signed)
Subjective:    Patient ID: Stacy Gentry, female    DOB: 1976/05/01, 42 y.o.   MRN: [id number]  HPI   Pt in today reporting urinary symptoms since Sunday night.   Dysuria- no but states a lot of suprapubic pressure Frequent urination- a lot of frequent urination. Hesitancy-no Suprapubic pressure-yes Fever-no chills-no Nausea-no Vomiting-no CVA pain-no back pain History of UTI- she has gotten 3 uti before in her life. Gross hematuria-no  lmp- about 2 weeks ago.   Review of Systems  Constitutional: Negative for chills, fatigue and fever.  Respiratory: Negative for cough, chest tightness, shortness of breath and wheezing.   Cardiovascular: Negative for chest pain and palpitations.  Gastrointestinal: Negative for abdominal distention, abdominal pain, blood in stool and constipation.  Genitourinary: Positive for frequency and urgency. Negative for difficulty urinating, dysuria, enuresis, flank pain and hematuria.  Musculoskeletal: Negative for back pain, joint swelling, neck pain and neck stiffness.  Skin: Negative for rash.  Neurological: Negative for dizziness, seizures, syncope, speech difficulty, weakness, numbness and headaches.  Hematological: Negative for adenopathy. Does not bruise/bleed easily.  Psychiatric/Behavioral: Negative for behavioral problems and decreased concentration. The patient is not nervous/anxious and is not hyperactive.     Past Medical History:  Diagnosis Date  . Anxiety   . Asthma   . Bulging lumbar disc   . Depression   . Diabetes mellitus without complication ([place])   . GERD (gastroesophageal reflux disease)   . Hypoglycemia   . Migraines    Dr.Paul Hassell Done in [place]  . PCOS (polycystic ovarian syndrome)    [facility]  . Shoulder disorder   . Syncope      Social History   Socioeconomic History  . Marital status: Single    Spouse name: Not on file  . Number of children: 0  . Years of education: Masters  . Highest  education level: Not on file  Occupational History  . Occupation: Clinical cytogeneticist for [employer]: [place]  . Financial resource strain: Not on file  . Food insecurity:    Worry: Not on file    Inability: Not on file  . Transportation needs:    Medical: Not on file    Non-medical: Not on file  Tobacco Use  . Smoking status: Never Smoker  . Smokeless tobacco: Never Used  Substance and Sexual Activity  . Alcohol use: No    Alcohol/week: 0.0 standard drinks    Comment: social  . Drug use: No  . Sexual activity: Not on file  Lifestyle  . Physical activity:    Days per week: Not on file    Minutes per session: Not on file  . Stress: Not on file  Relationships  . Social connections:    Talks on phone: Not on file    Gets together: Not on file    Attends religious service: Not on file    Active member of club or organization: Not on file    Attends meetings of clubs or organizations: Not on file    Relationship status: Not on file  . Intimate partner violence:    Fear of current or ex partner: Not on file    Emotionally abused: Not on file    Physically abused: Not on file    Forced sexual activity: Not on file  Other Topics Concern  . Not on file  Social History Narrative   Lives alone.   Exercise--no  Consumes about 2 cups of sweet tea week, 1 ginger ale a day     Past Surgical History:  Procedure Laterality Date  . DENTAL SURGERY    . None      Family History  Problem Relation Age of Onset  . Diabetes Mother   . Arthritis Mother   . Breast cancer Mother 40  . Hypertension Mother   . Hypertension Father   . Diabetes Maternal Grandmother   . Arthritis Maternal Grandmother   . Diabetes Maternal Aunt   . Hyperlipidemia Paternal Aunt   . Hyperlipidemia Paternal Uncle   . Stroke Paternal Uncle   . Kidney disease Paternal Uncle   . Depression Unknown   . Breast cancer Unknown        maternal cousin    . Heart disease Paternal Uncle 21       Sudden death  . Migraines Sister     Allergies  Allergen Reactions  . Codeine Hives, Shortness Of Breath and Itching  . Flagyl [Metronidazole] Nausea Only  . Hydrocodone Itching  . Oxycodone Itching  . Tussionex Pennkinetic Er [Hydrocod Polst-Cpm Polst Er]     Current Outpatient Medications on File Prior to Visit  Medication Sig Dispense Refill  . diclofenac (VOLTAREN) 75 MG EC tablet Take 1 tablet (75 mg total) by mouth 2 (two) times daily. 60 tablet 4  . diclofenac sodium (VOLTAREN) 1 % GEL Apply 2 g topically 2 (two) times daily as needed. 100 g 1  . gabapentin (NEURONTIN) 100 MG capsule Take one capsule by mouth at bedtime. Increase nightly until you are taking 3 capsules at bedtime. 270 capsule 3  . nystatin (MYCOSTATIN/NYSTOP) powder Apply topically 4 (four) times daily. 15 g 0  . omeprazole (PRILOSEC) 40 MG capsule TAKE 1 CAPSULE BY MOUTH EVERY DAY 90 capsule 1  . ondansetron (ZOFRAN ODT) 4 MG disintegrating tablet Take 1 tablet (4 mg total) by mouth every 4 (four) hours as needed for nausea or vomiting. 30 tablet 2  . SUMAtriptan (IMITREX) 6 MG/0.5ML SOLN injection Inject 0.5 mLs (6 mg total) into the skin as directed for 1 dose. May repeat once after 2 hours if not effective. 9 mL 1   Current Facility-Administered Medications on File Prior to Visit  Medication Dose Route Frequency Provider Last Rate Last Dose  . betamethasone acetate-betamethasone sodium phosphate (CELESTONE) injection 3 mg  3 mg Intramuscular Once Daylene Katayama M, DPM      . betamethasone acetate-betamethasone sodium phosphate (CELESTONE) injection 3 mg  3 mg Intramuscular Once Evans, Brent M, DPM        BP 129/86   Pulse (!) 121   Temp 99.1 F (37.3 C) (Oral)   Resp 16   Ht 5\' 4"  (1.626 m)   Wt 250 lb 6.4 oz (113.6 kg)   SpO2 100%   BMI 42.98 kg/m       Objective:   Physical Exam  General Appearance- Not in acute distress.    Chest and Lung  Exam Auscultation: Breath sounds:-Normal. Adventitious sounds:- No Adventitious sounds.  Cardiovascular Auscultation:Rythm - Regular. Heart Sounds -Normal heart sounds.  Abdomen Inspection:-Inspection Normal.  Palpation/Perucssion: Palpation and Percussion of the abdomen reveal- mid mild-moderate suprapubic  Pressure/faint tender, No Rebound tenderness, No rigidity(Guarding) and No Palpable abdominal masses.  Liver:-Normal.  Spleen:- Normal.   Back- no cva tenderness      Assessment & Plan:   You appear to have a urinary tract infection. I am prescribing  macrobid antibiotic for  the probable infection. Hydrate well. I am sending out a urine culture. During the interim if your signs and symptoms worsen rather than improving please notify us. We will notify your when the culture results are back.  Follow up in 7 days or as needed.   Mackie Pai, PA-C

## 2018-06-04 NOTE — Patient Instructions (Addendum)
You appear to have a urinary tract infection. I am prescribing macrobid antibiotic for the probable infection. Hydrate well. I am sending out a urine culture. During the interim if your signs and symptoms worsen rather than improving please notify us. We will notify your when the culture results are back.  Follow up in 7 days or as needed. 

## 2018-06-05 LAB — URINE CULTURE
MICRO NUMBER:: 91126232
SPECIMEN QUALITY:: ADEQUATE

## 2018-09-21 ENCOUNTER — Other Ambulatory Visit (HOSPITAL_COMMUNITY)
Admission: RE | Admit: 2018-09-21 | Discharge: 2018-09-21 | Disposition: A | Discharge status: Home / Self Care | Payer: PRIVATE HEALTH INSURANCE | Source: Ambulatory Visit | Attending: Family Medicine | Admitting: Family Medicine

## 2018-09-21 ENCOUNTER — Telehealth: Payer: Self-pay | Admitting: *Deleted

## 2018-09-21 ENCOUNTER — Ambulatory Visit (INDEPENDENT_AMBULATORY_CARE_PROVIDER_SITE_OTHER): Payer: PRIVATE HEALTH INSURANCE | Admitting: Family Medicine

## 2018-09-21 ENCOUNTER — Other Ambulatory Visit: Payer: Self-pay

## 2018-09-21 ENCOUNTER — Encounter: Payer: Self-pay | Admitting: *Deleted

## 2018-09-21 ENCOUNTER — Encounter: Payer: Self-pay | Admitting: Family Medicine

## 2018-09-21 VITALS — BP 138/86 | HR 116 | Temp 98.1°F | Resp 16 | Ht 64.0 in | Wt 251.8 lb

## 2018-09-21 DIAGNOSIS — Z0000 Encounter for general adult medical examination without abnormal findings: Secondary | ICD-10-CM

## 2018-09-21 DIAGNOSIS — B354 Tinea corporis: Secondary | ICD-10-CM | POA: Diagnosis not present

## 2018-09-21 DIAGNOSIS — Z124 Encounter for screening for malignant neoplasm of cervix: Secondary | ICD-10-CM

## 2018-09-21 DIAGNOSIS — G43009 Migraine without aura, not intractable, without status migrainosus: Secondary | ICD-10-CM

## 2018-09-21 DIAGNOSIS — R928 Other abnormal and inconclusive findings on diagnostic imaging of breast: Secondary | ICD-10-CM

## 2018-09-21 MED ORDER — SUMATRIPTAN SUCCINATE 6 MG/0.5ML ~~LOC~~ SOLN: 6.0000 mg | SUBCUTANEOUS | 1 refills | Status: AC

## 2018-09-21 MED ORDER — NYSTATIN 100000 UNIT/GM EX POWD: Freq: Four times a day (QID) | CUTANEOUS | 0 refills | Status: DC

## 2018-09-21 NOTE — Patient Instructions (Signed)
Preventive Care 40-64 Years, Female Preventive care refers to lifestyle choices and visits with your health care provider that can promote health and wellness. What does preventive care include?   A yearly physical exam. This is also called an annual well check.  Dental exams once or twice a year.  Routine eye exams. Ask your health care provider how often you should have your eyes checked.  Personal lifestyle choices, including: ? Daily care of your teeth and gums. ? Regular physical activity. ? Eating a healthy diet. ? Avoiding tobacco and drug use. ? Limiting alcohol use. ? Practicing safe sex. ? Taking low-dose aspirin daily starting at age 50. ? Taking vitamin and mineral supplements as recommended by your health care provider. What happens during an annual well check? The services and screenings done by your health care provider during your annual well check will depend on your age, overall health, lifestyle risk factors, and family history of disease. Counseling Your health care provider may ask you questions about your:  Alcohol use.  Tobacco use.  Drug use.  Emotional well-being.  Home and relationship well-being.  Sexual activity.  Eating habits.  Work and work environment.  Method of birth control.  Menstrual cycle.  Pregnancy history. Screening You may have the following tests or measurements:  Height, weight, and BMI.  Blood pressure.  Lipid and cholesterol levels. These may be checked every 5 years, or more frequently if you are over 50 years old.  Skin check.  Lung cancer screening. You may have this screening every year starting at age 55 if you have a 30-pack-year history of smoking and currently smoke or have quit within the past 15 years.  Colorectal cancer screening. All adults should have this screening starting at age 50 and continuing until age 75. Your health care provider may recommend screening at age 45. You will have tests every  1-10 years, depending on your results and the type of screening test. People at increased risk should start screening at an earlier age. Screening tests may include: ? Guaiac-based fecal occult blood testing. ? Fecal immunochemical test (FIT). ? Stool DNA test. ? Virtual colonoscopy. ? Sigmoidoscopy. During this test, a flexible tube with a tiny camera (sigmoidoscope) is used to examine your rectum and lower colon. The sigmoidoscope is inserted through your anus into your rectum and lower colon. ? Colonoscopy. During this test, a long, thin, flexible tube with a tiny camera (colonoscope) is used to examine your entire colon and rectum.  Hepatitis C blood test.  Hepatitis B blood test.  Sexually transmitted disease (STD) testing.  Diabetes screening. This is done by checking your blood sugar (glucose) after you have not eaten for a while (fasting). You may have this done every 1-3 years.  Mammogram. This may be done every 1-2 years. Talk to your health care provider about when you should start having regular mammograms. This may depend on whether you have a family history of breast cancer.  BRCA-related cancer screening. This may be done if you have a family history of breast, ovarian, tubal, or peritoneal cancers.  Pelvic exam and Pap test. This may be done every 3 years starting at age 21. Starting at age 30, this may be done every 5 years if you have a Pap test in combination with an HPV test.  Bone density scan. This is done to screen for osteoporosis. You may have this scan if you are at high risk for osteoporosis. Discuss your test results, treatment options,   and if necessary, the need for more tests with your health care provider. Vaccines Your health care provider may recommend certain vaccines, such as:  Influenza vaccine. This is recommended every year.  Tetanus, diphtheria, and acellular pertussis (Tdap, Td) vaccine. You may need a Td booster every 10 years.  Varicella  vaccine. You may need this if you have not been vaccinated.  Zoster vaccine. You may need this after age 38.  Measles, mumps, and rubella (MMR) vaccine. You may need at least one dose of MMR if you were born in 1957 or later. You may also need a second dose.  Pneumococcal 13-valent conjugate (PCV13) vaccine. You may need this if you have certain conditions and were not previously vaccinated.  Pneumococcal polysaccharide (PPSV23) vaccine. You may need one or two doses if you smoke cigarettes or if you have certain conditions.  Meningococcal vaccine. You may need this if you have certain conditions.  Hepatitis A vaccine. You may need this if you have certain conditions or if you travel or work in places where you may be exposed to hepatitis A.  Hepatitis B vaccine. You may need this if you have certain conditions or if you travel or work in places where you may be exposed to hepatitis B.  Haemophilus influenzae type b (Hib) vaccine. You may need this if you have certain conditions. Talk to your health care provider about which screenings and vaccines you need and how often you need them. This information is not intended to replace advice given to you by your health care provider. Make sure you discuss any questions you have with your health care provider. Document Released: 09/29/2015 Document Revised: 10/23/2017 Document Reviewed: 07/04/2015 Elsevier Interactive Patient Education  2019 [employer].

## 2018-09-21 NOTE — Telephone Encounter (Signed)
Received Mammogram results from [facility], Additional Imaging Recommended, Ultrasound, Additional 3D; forwarded to provider/SLS 01/06

## 2018-09-21 NOTE — Progress Notes (Signed)
Subjective:     Stacy Gentry is a 43 y.o. female and is here for a comprehensive physical exam. The patient reports no problems.---- except she recently had an abnormal mammogram   Social History   Socioeconomic History  . Marital status: Single    Spouse name: Not on file  . Number of children: 0  . Years of education: Masters  . Highest education level: Not on file  Occupational History  . Occupation: Clinical cytogeneticist for [employer]: [place]  . Financial resource strain: Not on file  . Food insecurity:    Worry: Not on file    Inability: Not on file  . Transportation needs:    Medical: Not on file    Non-medical: Not on file  Tobacco Use  . Smoking status: Never Smoker  . Smokeless tobacco: Never Used  Substance and Sexual Activity  . Alcohol use: No    Alcohol/week: 0.0 standard drinks    Comment: social  . Drug use: No  . Sexual activity: Not on file  Lifestyle  . Physical activity:    Days per week: Not on file    Minutes per session: Not on file  . Stress: Not on file  Relationships  . Social connections:    Talks on phone: Not on file    Gets together: Not on file    Attends religious service: Not on file    Active member of club or organization: Not on file    Attends meetings of clubs or organizations: Not on file    Relationship status: Not on file  . Intimate partner violence:    Fear of current or ex partner: Not on file    Emotionally abused: Not on file    Physically abused: Not on file    Forced sexual activity: Not on file  Other Topics Concern  . Not on file  Social History Narrative   Lives alone.   Exercise--no   Consumes about 2 cups of sweet tea week, 1 ginger ale a day    Health Maintenance  Topic Date Due  . HIV Screening  07/30/1991  . PAP SMEAR-Modifier  04/24/2014  . TETANUS/TDAP  11/21/2016  . INFLUENZA VACCINE  04/16/2018    The following portions of the  patient's history were reviewed and updated as appropriate: She  has a past medical history of Anxiety, Asthma, Bulging lumbar disc, Depression, Diabetes mellitus without complication ([place]), GERD (gastroesophageal reflux disease), Hypoglycemia, Migraines, PCOS (polycystic ovarian syndrome), Shoulder disorder, and Syncope. She does not have any pertinent problems on file. She  has a past surgical history that includes None and Dental surgery. Her family history includes Arthritis in her maternal grandmother and mother; Breast cancer in her unknown relative; Breast cancer (age of onset: 60) in her mother; Depression in her unknown relative; Diabetes in her maternal aunt, maternal grandmother, and mother; Heart disease (age of onset: 21) in her paternal uncle; Hyperlipidemia in her paternal aunt and paternal uncle; Hypertension in her father and mother; Kidney disease in her paternal uncle; Migraines in her sister; Stroke in her paternal uncle. She  reports that she has never smoked. She has never used smokeless tobacco. She reports that she does not drink alcohol or use drugs. She has a current medication list which includes the following prescription(s): diclofenac, diclofenac sodium, nystatin, omeprazole, ondansetron, sumatriptan, gabapentin, and nitrofurantoin (macrocrystal-monohydrate), and the following Facility-Administered Medications: betamethasone acetate-betamethasone sodium phosphate and betamethasone  acetate-betamethasone sodium phosphate. Current Outpatient Medications on File Prior to Visit  Medication Sig Dispense Refill  . diclofenac (VOLTAREN) 75 MG EC tablet Take 1 tablet (75 mg total) by mouth 2 (two) times daily. 60 tablet 4  . diclofenac sodium (VOLTAREN) 1 % GEL Apply 2 g topically 2 (two) times daily as needed. 100 g 1  . nystatin (MYCOSTATIN/NYSTOP) powder Apply topically 4 (four) times daily. 15 g 0  . omeprazole (PRILOSEC) 40 MG capsule TAKE 1 CAPSULE BY MOUTH EVERY DAY 90 capsule  1  . ondansetron (ZOFRAN ODT) 4 MG disintegrating tablet Take 1 tablet (4 mg total) by mouth every 4 (four) hours as needed for nausea or vomiting. 30 tablet 2  . SUMAtriptan (IMITREX) 6 MG/0.5ML SOLN injection Inject 0.5 mLs (6 mg total) into the skin as directed for 1 dose. May repeat once after 2 hours if not effective. 9 mL 1  . gabapentin (NEURONTIN) 100 MG capsule Take one capsule by mouth at bedtime. Increase nightly until you are taking 3 capsules at bedtime. (Patient not taking: Reported on 09/21/2018) 270 capsule 3  . nitrofurantoin, macrocrystal-monohydrate, (MACROBID) 100 MG capsule Take 1 capsule (100 mg total) by mouth 2 (two) times daily. (Patient not taking: Reported on 09/21/2018) 14 capsule 0   Current Facility-Administered Medications on File Prior to Visit  Medication Dose Route Frequency Provider Last Rate Last Dose  . betamethasone acetate-betamethasone sodium phosphate (CELESTONE) injection 3 mg  3 mg Intramuscular Once Daylene Katayama M, DPM      . betamethasone acetate-betamethasone sodium phosphate (CELESTONE) injection 3 mg  3 mg Intramuscular Once Edrick Kins, DPM       She is allergic to codeine; flagyl [metronidazole]; hydrocodone; oxycodone; and tussionex pennkinetic er [hydrocod polst-cpm polst er]..  Review of Systems Review of Systems  Constitutional: Negative for activity change, appetite change and fatigue.  HENT: Negative for hearing loss, congestion, tinnitus and ear discharge.  dentist q3m Eyes: Negative for visual disturbance (see optho q1y -- vision corrected to 20/20 with glasses).  Respiratory: Negative for cough, chest tightness and shortness of breath.   Cardiovascular: Negative for chest pain, palpitations and leg swelling.  Gastrointestinal: Negative for abdominal pain, diarrhea, constipation and abdominal distention.  Genitourinary: Negative for urgency, frequency, decreased urine volume and difficulty urinating.  Musculoskeletal: Negative for back  pain, arthralgias and gait problem.  Skin: Negative for color change, pallor and rash.  Neurological: Negative for dizziness, light-headedness, numbness and headaches.  Hematological: Negative for adenopathy. Does not bruise/bleed easily.  Psychiatric/Behavioral: Negative for suicidal ideas, confusion, sleep disturbance, self-injury, dysphoric mood, decreased concentration and agitation.       Objective:    BP 138/86   Pulse (!) 116   Temp 98.1 F (36.7 C) (Oral)   Resp 16   Ht 5\' 4"  (1.626 m)   Wt 251 lb 12.8 oz (114.2 kg)   LMP 09/14/2018 (Exact Date)   SpO2 100%   BMI 43.22 kg/m  General appearance: alert, cooperative, appears stated age and no distress Head: Normocephalic, without obvious abnormality, atraumatic Eyes: conjunctivae/corneas clear. PERRL, EOM's intact. Fundi benign. Ears: normal TM's and external ear canals both ears Nose: Nares normal. Septum midline. Mucosa normal. No drainage or sinus tenderness. Throat: lips, mucosa, and tongue normal; teeth and gums normal Neck: no adenopathy, no carotid bruit, no JVD, supple, symmetrical, trachea midline and thyroid not enlarged, symmetric, no tenderness/mass/nodules Back: symmetric, no curvature. ROM normal. No CVA tenderness. Lungs: clear to auscultation bilaterally Breasts: normal appearance,  no masses or tenderness Heart: regular rate and rhythm, S1, S2 normal, no murmur, click, rub or gallop Abdomen: soft, non-tender; bowel sounds normal; no masses,  no organomegaly Pelvic: cervix normal in appearance, exam obscured by obesity, external genitalia normal, no adnexal masses or tenderness, no cervical motion tenderness, rectovaginal septum normal, uterus normal size, shape, and consistency, vagina normal without discharge and pap done--  rectal heme neg brown stool Extremities: extremities normal, atraumatic, no cyanosis or edema Pulses: 2+ and symmetric Skin: Skin color, texture, turgor normal. No rashes or  lesions Lymph nodes: Cervical, supraclavicular, and axillary nodes normal. Neurologic: Alert and oriented X 3, normal strength and tone. Normal symmetric reflexes. Normal coordination and gait    Assessment:    Healthy female exam.      Plan:  ghm utd Check labs    See After Visit Summary for Counseling Recommendations    1. Cervical cancer screening  - Cytology - PAP( [facility])  2. Migraine without aura and without status migrainosus, not intractable Stable  Refill meds - SUMAtriptan (IMITREX) 6 MG/0.5ML SOLN injection; Inject 0.5 mLs (6 mg total) into the skin as directed for 1 dose. May repeat once after 2 hours if not effective.  Dispense: 9 mL; Refill: 1  3. Tinea corporis  - nystatin (MYCOSTATIN/NYSTOP) powder; Apply topically 4 (four) times daily.  Dispense: 15 g; Refill: 0  4. Preventative health care See above - Lipid panel - CBC with Differential/Platelet - TSH - Comprehensive metabolic panel  5. Abnormal mammogram From [facility] -- can see in care everywhere  - MM 3D SCREEN BREAST BILATERAL; Future - US BREAST COMPLETE UNI LEFT INC AXILLA; Future

## 2018-09-21 NOTE — Progress Notes (Signed)
Pre visit review using our clinic tool,if applicable. No additional management support is needed unless otherwise documented below in the visit note.  

## 2018-09-22 LAB — TSH: TSH: 0.76 u[IU]/mL (ref 0.35–4.50)

## 2018-09-22 LAB — COMPREHENSIVE METABOLIC PANEL
ALT: 9 U/L (ref 0–35)
AST: 12 U/L (ref 0–37)
Albumin: 4.4 g/dL (ref 3.5–5.2)
Alkaline Phosphatase: 55 U/L (ref 39–117)
BUN: 7 mg/dL (ref 6–23)
CO2: 27 mEq/L (ref 19–32)
Calcium: 9.3 mg/dL (ref 8.4–10.5)
Chloride: 102 mEq/L (ref 96–112)
Creatinine, Ser: 0.83 mg/dL (ref 0.40–1.20)
GFR: 96.88 mL/min (ref >60.00)
Glucose, Bld: 96 mg/dL (ref 70–99)
Potassium: 3.9 mEq/L (ref 3.5–5.1)
Sodium: 139 mEq/L (ref 135–145)
Total Bilirubin: 0.4 mg/dL (ref 0.2–1.2)
Total Protein: 7 g/dL (ref 6.0–8.3)

## 2018-09-22 LAB — CBC WITH DIFFERENTIAL/PLATELET
BASOS PCT: 1 % (ref 0.0–3.0)
Basophils Absolute: 0.1 10*3/uL (ref 0.0–0.1)
EOS PCT: 0.6 % (ref 0.0–5.0)
Eosinophils Absolute: 0.1 10*3/uL (ref 0.0–0.7)
HCT: 35 % — ABNORMAL LOW (ref 36.0–46.0)
HEMOGLOBIN: 11.3 g/dL — AB (ref 12.0–15.0)
LYMPHS ABS: 2.3 10*3/uL (ref 0.7–4.0)
Lymphocytes Relative: 18.5 % (ref 12.0–46.0)
MCHC: 32.4 g/dL (ref 30.0–36.0)
MCV: 82.8 fl (ref 78.0–100.0)
MONO ABS: 0.7 10*3/uL (ref 0.1–1.0)
Monocytes Relative: 5.7 % (ref 3.0–12.0)
NEUTROS ABS: 9.3 10*3/uL — AB (ref 1.4–7.7)
NEUTROS PCT: 74.2 % (ref 43.0–77.0)
Platelets: 404 10*3/uL — ABNORMAL HIGH (ref 150.0–400.0)
RBC: 4.23 Mil/uL (ref 3.87–5.11)
RDW: 14.2 % (ref 11.5–15.5)
WBC: 12.5 10*3/uL — ABNORMAL HIGH (ref 4.0–10.5)

## 2018-09-22 LAB — LIPID PANEL
Cholesterol: 173 mg/dL (ref 0–200)
HDL: 56.9 mg/dL (ref >39.00)
LDL CALC: 90 mg/dL (ref 0–99)
NonHDL: 115.71
TRIGLYCERIDES: 128 mg/dL (ref 0.0–149.0)
Total CHOL/HDL Ratio: 3
VLDL: 25.6 mg/dL (ref 0.0–40.0)

## 2018-09-23 ENCOUNTER — Telehealth: Payer: Self-pay | Admitting: *Deleted

## 2018-09-23 ENCOUNTER — Other Ambulatory Visit: Payer: Self-pay | Admitting: Family Medicine

## 2018-09-23 ENCOUNTER — Other Ambulatory Visit: Payer: Self-pay | Admitting: Medical

## 2018-09-23 ENCOUNTER — Encounter: Payer: Self-pay | Admitting: Family Medicine

## 2018-09-23 DIAGNOSIS — B354 Tinea corporis: Secondary | ICD-10-CM

## 2018-09-23 NOTE — Telephone Encounter (Signed)
Received letter regarding 09/01/18 Mammogram results from [facility] stating that findings require additional imaging evaluation, also, that it indicated that pt's individual breast density classification is scatered fibroglandular density; forwarded to provider/SLS 01/08

## 2018-09-24 LAB — CYTOLOGY - PAP
DIAGNOSIS: NEGATIVE
HPV: NOT DETECTED

## 2018-09-25 ENCOUNTER — Telehealth: Payer: Self-pay | Admitting: Family Medicine

## 2018-09-25 ENCOUNTER — Other Ambulatory Visit: Payer: Self-pay | Admitting: Family Medicine

## 2018-09-25 NOTE — Telephone Encounter (Signed)
Called [facility], no answer left detailed message along with patient information and our fax number to send [place] mammogram report.

## 2018-09-25 NOTE — Telephone Encounter (Signed)
Call [facility] [phone] to get an actual report of mammogram.

## 2018-09-25 NOTE — Telephone Encounter (Signed)
Pharmacy called and they stated that they will fix it.  They did not see Dr. Etter Sjogren note that it was ok to fill prefilled pens.

## 2018-09-25 NOTE — Telephone Encounter (Signed)
Copied from [place] [id number]. Topic: Quick Communication - See Telephone Encounter >> Sep 25, 2018 11:45 AM Blase Mess A wrote: CRM for notification. See Telephone encounter for: 09/25/18.  Patient is calling regarding her SUMAtriptan (IMITREX) 6 MG/0.5ML SOLN injection [[id number]]    The medication is incorrect.  She is has never used this form before.  She has used the form that the medicaiton in the unit.  Patient is requesting the script to be changed. Please advise [phone] [id number] [address], [place] [address] [phone] (Phone) [phone] (Fax)

## 2018-09-25 NOTE — Telephone Encounter (Signed)
epipen is completely different then imitrex  She is wanting the imitrex pen ?   We just refilled what was on her list-- may need to call pharmacy to find out what happened

## 2018-09-28 ENCOUNTER — Telehealth: Payer: Self-pay | Admitting: *Deleted

## 2018-09-28 ENCOUNTER — Other Ambulatory Visit: Payer: Self-pay | Admitting: *Deleted

## 2018-09-28 DIAGNOSIS — R928 Other abnormal and inconclusive findings on diagnostic imaging of breast: Secondary | ICD-10-CM

## 2018-09-28 NOTE — Telephone Encounter (Signed)
Copied from [place] [phone]. Topic: Referral - Status >> Sep 28, 2018 10:51 AM Ivar Drape wrote: Reason for CRM:   Peter Minium w/[facility] [phone] stated the patient is coming in today for a Less Limited Ultrasound w/Less Additional View Mammo if needed and they need a order for it ASAP.  It can be faxed to [phone].

## 2018-09-28 NOTE — Telephone Encounter (Signed)
Called patient about appointment. She has an appointment today at [facility] at 230pm

## 2018-09-29 NOTE — Telephone Encounter (Signed)
Order faxed yesterday

## 2018-09-29 NOTE — Telephone Encounter (Signed)
Received Physician Orders from [facility] at [phone]; forwarded to provider/SLS 01/14

## 2018-10-01 ENCOUNTER — Telehealth: Payer: Self-pay | Admitting: Family Medicine

## 2018-10-01 ENCOUNTER — Other Ambulatory Visit: Payer: Self-pay | Admitting: Family Medicine

## 2018-10-01 DIAGNOSIS — D72829 Elevated white blood cell count, unspecified: Secondary | ICD-10-CM

## 2018-10-01 DIAGNOSIS — R928 Other abnormal and inconclusive findings on diagnostic imaging of breast: Secondary | ICD-10-CM

## 2018-10-01 NOTE — Telephone Encounter (Signed)
Copied from [place] [id number]. Topic: Quick Communication - See Telephone Encounter >> Oct 01, 2018  9:15 AM Sheran Luz wrote: CRM for notification. See Telephone encounter for: 10/01/18.  Ria Comment, with [facility] outpatient imaging, calling to request order for additional diagnostic mammogram of left breast. Please advise.

## 2018-10-02 NOTE — Telephone Encounter (Signed)
Order faxed over

## 2018-10-06 ENCOUNTER — Telehealth: Payer: Self-pay | Admitting: *Deleted

## 2018-10-06 NOTE — Telephone Encounter (Signed)
Received Physician Orders from [facility], also letter stating that patient has not scheduled or returned to their office for the recommended procedure[s]; forwarded to provider/SLS 01/21

## 2018-10-07 ENCOUNTER — Telehealth: Payer: Self-pay | Admitting: *Deleted

## 2018-10-07 NOTE — Telephone Encounter (Signed)
Patient stated that [facility] had called her back and they did find the order.  She had to use [facility] due change in ins.

## 2018-10-07 NOTE — Telephone Encounter (Signed)
Copied from [place] [phone]. Topic: Quick Communication - See Telephone Encounter >> Oct 01, 2018  9:15 AM Sheran Luz wrote: CRM for notification. See Telephone encounter for: 10/01/18.  Ria Comment, with [facility] outpatient imaging, calling to request order for additional diagnostic mammogram of left breast. Please advise. >> Oct 07, 2018 12:04 PM Vernona Rieger wrote: Patient states they never received the fax or the order and she is waiting to schedule.

## 2018-10-15 ENCOUNTER — Encounter: Payer: Self-pay | Admitting: Family Medicine

## 2018-10-19 ENCOUNTER — Telehealth: Payer: Self-pay | Admitting: *Deleted

## 2018-10-19 NOTE — Telephone Encounter (Signed)
Received Mammogram results from [facility]; forwarded to provider/SLS 02/03

## 2018-12-16 ENCOUNTER — Telehealth: Payer: Self-pay | Admitting: *Deleted

## 2018-12-16 DIAGNOSIS — D72829 Elevated white blood cell count, unspecified: Secondary | ICD-10-CM

## 2018-12-16 NOTE — Telephone Encounter (Signed)
-----   Message from Doylene Canning, [place] sent at 12/16/2018  1:33 PM EDT ----- Regarding: FW: Lab Visit  ----- Message ----- From: Ann Held, DO Sent: 12/15/2018   9:16 AM EDT To: Doylene Canning, CMA Subject: RE: Lab Visit                                  That is fine ----- Message ----- From: Doylene Canning, CMA Sent: 12/15/2018   8:34 AM EDT To: Ann Held, DO Subject: FW: Lab Visit                                  [place] ----- Message ----- From: Caffie Pinto Sent: 12/14/2018  12:00 PM EDT To: Doylene Canning, CMA Subject: Lab Visit                                      Hey, this patient was rescheduled for a 12-31-18 lab appt for cbc w/smear. She wanted it changed to May 26 because early hours worked better for her, but I did make her aware that I would forward this message incase the doctor wants it done sooner because of the test.

## 2018-12-16 NOTE — Telephone Encounter (Signed)
Test has been re-ordered to reflect new appt date/time.

## 2018-12-31 ENCOUNTER — Other Ambulatory Visit: Payer: PRIVATE HEALTH INSURANCE

## 2019-01-29 ENCOUNTER — Other Ambulatory Visit: Payer: Self-pay | Admitting: Physician Assistant

## 2019-02-09 ENCOUNTER — Other Ambulatory Visit: Payer: PRIVATE HEALTH INSURANCE

## 2019-03-23 ENCOUNTER — Other Ambulatory Visit: Payer: Self-pay | Admitting: Family Medicine

## 2019-04-15 ENCOUNTER — Telehealth: Payer: Self-pay | Admitting: Family Medicine

## 2019-04-15 NOTE — Telephone Encounter (Signed)
Rena w/[facility] stated that they faxed over an order request for the provider to sign and have it faxed back to [phone] for a  diagnostic mammo left w/3d and breast ultrasound left side. Appt is tomorrow 04/16/2019 at 9:00am. They need order faxed ASAP.

## 2019-04-16 ENCOUNTER — Other Ambulatory Visit: Payer: Self-pay | Admitting: *Deleted

## 2019-04-16 ENCOUNTER — Other Ambulatory Visit: Payer: Self-pay

## 2019-04-16 DIAGNOSIS — R928 Other abnormal and inconclusive findings on diagnostic imaging of breast: Secondary | ICD-10-CM

## 2019-04-16 NOTE — Progress Notes (Signed)
Radiologist Amy called stating they needed updated orders. Patient is scheduled to come in today at 9am to haveprocedure done. I have re ordered the Mammogram for patient and faxed over to [phone].

## 2019-04-22 ENCOUNTER — Other Ambulatory Visit: Payer: Self-pay | Admitting: Gastroenterology

## 2019-04-28 ENCOUNTER — Telehealth: Payer: Self-pay | Admitting: *Deleted

## 2019-04-28 NOTE — Telephone Encounter (Signed)
Spoke with Maggie at below # and confirmed they received mammogram order that was printed / signed 04/16/19 for breast imaging. They are also needing order / referral sheet that covers ultrasound if needed. Printed signed order from [facility] and refaxed as she states they did not previously received that page.   Copied from [place] [phone]. Topic: General - Other >> Apr 28, 2019 11:49 AM Stacy Gentry wrote: Reason for CRM: Imaging called in to confirm if orders were received for signature by PCP? Pt has appt with them tomorrow.   CB: [phone] Fax: [phone]

## 2019-04-30 ENCOUNTER — Telehealth: Payer: Self-pay | Admitting: Family Medicine

## 2019-04-30 ENCOUNTER — Telehealth: Payer: Self-pay | Admitting: *Deleted

## 2019-04-30 NOTE — Telephone Encounter (Signed)
Relation to pt: self  Call back number: [phone] [id number] [place] [place] [phone] (Phone) [phone] (Fax)     Reason for call:  Patient requesting cyclobenzaprine (FLEXERIL) 10 MG tablet for back spasms caused by bulging disc in neck , please advise if Rx can expedited

## 2019-05-03 ENCOUNTER — Other Ambulatory Visit: Payer: Self-pay | Admitting: Physical Medicine and Rehabilitation

## 2019-05-03 ENCOUNTER — Encounter: Payer: Self-pay | Admitting: Family Medicine

## 2019-05-03 ENCOUNTER — Other Ambulatory Visit: Payer: Self-pay

## 2019-05-03 ENCOUNTER — Ambulatory Visit (INDEPENDENT_AMBULATORY_CARE_PROVIDER_SITE_OTHER): Payer: PRIVATE HEALTH INSURANCE | Admitting: Family Medicine

## 2019-05-03 DIAGNOSIS — M542 Cervicalgia: Secondary | ICD-10-CM | POA: Diagnosis not present

## 2019-05-03 MED ORDER — TRAMADOL HCL 50 MG PO TABS: 50.0000 mg | ORAL_TABLET | Freq: Three times a day (TID) | ORAL | 0 refills | Status: AC | PRN

## 2019-05-03 MED ORDER — METHOCARBAMOL 500 MG PO TABS: 500.0000 mg | ORAL_TABLET | Freq: Three times a day (TID) | ORAL | 0 refills | Status: DC | PRN

## 2019-05-03 NOTE — Telephone Encounter (Signed)
Called pt and advised.  

## 2019-05-03 NOTE — Progress Notes (Signed)
Virtual Visit via Video Note  I connected with Laycie Schriner Such on 05/03/19 at  4:00 PM EDT by a video enabled telemedicine application and verified that I am speaking with the correct person using two identifiers.  Location: Patient: home Provider: office    I discussed the limitations of evaluation and management by telemedicine and the availability of in person appointments. The patient expressed understanding and agreed to proceed.  History of Present Illness: Pt is home c/o neck pain after fall last weekend.  She has a hx of a bulging disc in her neck and it feels like that is flared.  She has an appointment with her ortho on the 25th but is needing something for pain until she gets in The diclofenac is not working.     Observations/Objective: No vitals obtained Pt is in nad-- resting in bed  Assessment and Plan: 1. Neck pain Pt had robaxin called in by ortho Ultram sent in as well F/u with ortho Ice/ heat  - traMADol (ULTRAM) 50 MG tablet; Take 1 tablet (50 mg total) by mouth every 8 (eight) hours as needed for up to 5 days.  Dispense: 15 tablet; Refill: 0   Follow Up Instructions:    I discussed the assessment and treatment plan with the patient. The patient was provided an opportunity to ask questions and all were answered. The patient agreed with the plan and demonstrated an understanding of the instructions.   The patient was advised to call back or seek an in-person evaluation if the symptoms worsen or if the condition fails to improve as anticipated.  I provided 15 minutes of non-face-to-face time during this encounter.   Ann Held, DO

## 2019-05-03 NOTE — Telephone Encounter (Signed)
Pt will need a appointment for refills. In office or virtual is fine.

## 2019-05-03 NOTE — Telephone Encounter (Signed)
Scheduled appointment 05/03/19

## 2019-05-03 NOTE — Progress Notes (Deleted)
Patient ID: Stacy Gentry, female    DOB: [birth year]  Age: 43 y.o. MRN: [id number]    Subjective:  Subjective  HPI Kimberlye Dilger Rubert presents for f/u fall   Review of Systems  History Past Medical History:  Diagnosis Date  . Anxiety   . Asthma   . Bulging lumbar disc   . Depression   . Diabetes mellitus without complication ([place])   . GERD (gastroesophageal reflux disease)   . Hypoglycemia   . Migraines    Dr.Paul Hassell Done in [place]  . PCOS (polycystic ovarian syndrome)    [facility]  . Shoulder disorder   . Syncope     She has a past surgical history that includes None and Dental surgery.   Her family history includes Arthritis in her maternal grandmother and mother; Breast cancer in her unknown relative; Breast cancer (age of onset: 82) in her mother; Depression in her unknown relative; Diabetes in her maternal aunt, maternal grandmother, and mother; Heart disease (age of onset: 63) in her paternal uncle; Hyperlipidemia in her paternal aunt and paternal uncle; Hypertension in her father and mother; Kidney disease in her paternal uncle; Migraines in her sister; Stroke in her paternal uncle.She reports that she has never smoked. She has never used smokeless tobacco. She reports that she does not drink alcohol or use drugs.  Current Outpatient Medications on File Prior to Visit  Medication Sig Dispense Refill  . diclofenac (VOLTAREN) 75 MG EC tablet Take 1 tablet (75 mg total) by mouth 2 (two) times daily. 60 tablet 4  . diclofenac sodium (VOLTAREN) 1 % GEL Apply 2 g topically 2 (two) times daily as needed. 100 g 1  . gabapentin (NEURONTIN) 100 MG capsule Take one capsule by mouth at bedtime. Increase nightly until you are taking 3 capsules at bedtime. (Patient not taking: Reported on 09/21/2018) 270 capsule 3  . nitrofurantoin, macrocrystal-monohydrate, (MACROBID) 100 MG capsule Take 1 capsule (100 mg total) by mouth 2 (two) times daily. (Patient not taking: Reported on  09/21/2018) 14 capsule 0  . nystatin (MYCOSTATIN/NYSTOP) powder Apply topically 4 (four) times daily. 15 g 0  . omeprazole (PRILOSEC) 40 MG capsule Take 1 capsule (40 mg total) by mouth daily. Please schedule a follow up visit for additional refills. Thank you 30 capsule 1  . ondansetron (ZOFRAN ODT) 4 MG disintegrating tablet Take 1 tablet (4 mg total) by mouth every 4 (four) hours as needed for nausea or vomiting. 30 tablet 2  . SUMAtriptan (IMITREX) 50 MG tablet TAKE 1 TAB(S) ORALLY ONCE A DAY AND MAY REPEAT 2 HOURS LATER X ONCE IF NEEDED 9 tablet 3  . SUMAtriptan (IMITREX) 6 MG/0.5ML SOLN injection Inject 0.5 mLs (6 mg total) into the skin as directed for 1 dose. May repeat once after 2 hours if not effective. 9 mL 1   Current Facility-Administered Medications on File Prior to Visit  Medication Dose Route Frequency Provider Last Rate Last Dose  . betamethasone acetate-betamethasone sodium phosphate (CELESTONE) injection 3 mg  3 mg Intramuscular Once Daylene Katayama M, DPM      . betamethasone acetate-betamethasone sodium phosphate (CELESTONE) injection 3 mg  3 mg Intramuscular Once Edrick Kins, DPM         Objective:  Objective  Physical Exam There were no vitals taken for this visit. Wt Readings from Last 3 Encounters:  09/21/18 251 lb 12.8 oz (114.2 kg)  06/04/18 250 lb 6.4 oz (113.6 kg)  05/11/18 249 lb 9.6 oz (  113.2 kg)     Lab Results  Component Value Date   WBC 12.5 (H) 09/21/2018   HGB 11.3 (L) 09/21/2018   HCT 35.0 (L) 09/21/2018   PLT 404.0 (H) 09/21/2018   GLUCOSE 96 09/21/2018   CHOL 173 09/21/2018   TRIG 128.0 09/21/2018   HDL 56.90 09/21/2018   LDLCALC 90 09/21/2018   ALT 9 09/21/2018   AST 12 09/21/2018   NA 139 09/21/2018   K 3.9 09/21/2018   CL 102 09/21/2018   CREATININE 0.83 09/21/2018   BUN 7 09/21/2018   CO2 27 09/21/2018   TSH 0.76 09/21/2018   HGBA1C 5.8 04/11/2011    No results found.   Assessment & Plan:  Plan  I am having Royal M.  Mattox maintain her ondansetron, diclofenac sodium, gabapentin, diclofenac, nitrofurantoin (macrocrystal-monohydrate), SUMAtriptan, nystatin, SUMAtriptan, and omeprazole. We will continue to administer betamethasone acetate-betamethasone sodium phosphate and betamethasone acetate-betamethasone sodium phosphate.  No orders of the defined types were placed in this encounter.   Problem List Items Addressed This Visit    None      Follow-up: No follow-ups on file.  Ann Held, DO

## 2019-05-03 NOTE — Telephone Encounter (Signed)
Very hard to do much in the way of medication prescription as we have not seen her in a long time.  I did go ahead and send a prescription in for methocarbamol which is a muscle relaxer.  She can continue to take the diclofenac 75 mg tablets prescribed by her primary care physician.

## 2019-05-03 NOTE — Progress Notes (Signed)
Prescription written for methocarbamol as a muscle relaxer and pain relief.  Patient can continue to take the diclofenac 75 mg tablet that was given to her by her primary care physician.

## 2019-05-03 NOTE — Telephone Encounter (Signed)
Last MRI was 2015. If we do OV and anything is out of sorts proabably would not do same day injection, if it all checks out would consider same day injection if allowed by insurance

## 2019-05-03 NOTE — Telephone Encounter (Signed)
Pt is scheduled for an OV and states she would like to have something for pain. Pt uses [facility] off [address]. Please Advise.

## 2019-05-11 ENCOUNTER — Ambulatory Visit (INDEPENDENT_AMBULATORY_CARE_PROVIDER_SITE_OTHER): Payer: PRIVATE HEALTH INSURANCE | Admitting: Physical Medicine and Rehabilitation

## 2019-05-11 ENCOUNTER — Encounter: Payer: Self-pay | Admitting: Physical Medicine and Rehabilitation

## 2019-05-11 ENCOUNTER — Other Ambulatory Visit: Payer: Self-pay | Admitting: Physical Medicine and Rehabilitation

## 2019-05-11 VITALS — BP 139/96 | HR 138

## 2019-05-11 DIAGNOSIS — M5442 Lumbago with sciatica, left side: Secondary | ICD-10-CM

## 2019-05-11 DIAGNOSIS — M7918 Myalgia, other site: Secondary | ICD-10-CM

## 2019-05-11 DIAGNOSIS — M542 Cervicalgia: Secondary | ICD-10-CM | POA: Diagnosis not present

## 2019-05-11 DIAGNOSIS — G8929 Other chronic pain: Secondary | ICD-10-CM

## 2019-05-11 DIAGNOSIS — M5010 Cervical disc disorder with radiculopathy, unspecified cervical region: Secondary | ICD-10-CM | POA: Diagnosis not present

## 2019-05-11 DIAGNOSIS — M5441 Lumbago with sciatica, right side: Secondary | ICD-10-CM

## 2019-05-11 MED ORDER — METHOCARBAMOL 500 MG PO TABS: 500.0000 mg | ORAL_TABLET | Freq: Three times a day (TID) | ORAL | 0 refills | Status: DC | PRN

## 2019-05-11 MED ORDER — PREDNISONE 50 MG PO TABS: ORAL_TABLET | ORAL | 0 refills | Status: DC

## 2019-05-11 MED ORDER — GABAPENTIN 600 MG PO TABS: 300.0000 mg | ORAL_TABLET | Freq: Every day | ORAL | 2 refills | Status: AC

## 2019-05-11 NOTE — Progress Notes (Signed)
Stacy Gentry - 43 y.o. female MRN [id number]  Date of birth: April 08, 1976  Office Visit Note: Visit Date: 05/11/2019 PCP: Ann Held, DO Referred by: Ann Held, *  Subjective: Chief Complaint  Patient presents with   Lower Back - Pain   Left Leg - Pain   Right Leg - Pain   Right Shoulder - Pain   Left Shoulder - Pain   Neck - Pain   HPI: Stacy Gentry is a 43 y.o. female who comes in today As a new patient evaluation for both neck and upper back pain and bilateral arm pain as well as bilateral lower extremity pain.  We have seen the patient in the past but the last time I saw her was in 2015.  She was also followed by Dr. Anderson Malta from an orthopedic standpoint but last saw him in 2017.  She has a medical history complicated by migraine headaches as well as PCOS and obesity.  In general she reports chronic pain for many years.  Prior cervical injections were epidural injections do to small disc herniations at 2 levels particular at C7-T1.  Cervical MRI was obtained at that time in 2015 as reviewed below.  She does report some relief with the injection.  Interestingly during those injections she had vasovagal type episode immediately after the injection.  Dr. Marlou Sa saw her in 2017 she was having low back and left radicular type leg pain.  MRI of the lumbar spine was obtained and this is reviewed below.  I never saw her at that point for her low back or left hip and leg although he had recommended that she see me.  She comes in today reporting a very specific onset of symptoms in the low back and bilateral legs that occurred with pretty abrupt onset August 5.  She denies any specific trauma or other issues surrounding that timeframe.  She reported having to lay in bed and really could not move very well because of the pain.  She reports that she obtained prednisone from her primary care physician I believe and did get some relief of her back pain.   Unfortunately the pain began to resurface or begin again in the neck and bilateral shoulders and radiating or referring down the posterior arms and more of a C7-T1 radicular type pattern.  She also reports symptoms at the same time that her neck and arms hurt radiating down the legs but her back pain is better.  She does get symptoms posterior calf into the feet.  This would be more of an S1 distribution that were nerve root related.  She has gotten some improvement since we first scheduled her for the appointment.  We did prescribe muscle relaxer which was Robaxin and she has tramadol from her primary care physician.  She does not endorse any focal weakness but has felt weak.  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  She has not had any recent physical therapy or any other treatments other than medication management and activity modification and time.  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  Again she is morbidly obese unfortunately with other medical conditions.  Her pain level is a 6 out of 10 at this point she relates this is intermittent and aching pain worse  with walking and standing and better at rest.  Interestingly, and I will talk about this more in the assessment and plan but she began having vasovagal episodes every time I would discuss my opinions on why she was hurting.  Almost On cue every time I began to offer explanation and playing and she would begin getting pretty significant vasovagal type activity and ultimately we had to lay her down and give her cold water and teach her some breathing exercises.  She reported that she gets very anxious and panicky.  Reviewing her record today does not show any medications for anxiety.  This does fit well though with the prior vasovagal episode we had during injections in 2015.  Review of Systems    Constitutional: Positive for malaise/fatigue. Negative for chills, fever and weight loss.  HENT: Negative for hearing loss and sinus pain.   Eyes: Negative for blurred vision, double vision and photophobia.  Respiratory: Negative for cough and shortness of breath.   Cardiovascular: Negative for chest pain, palpitations and leg swelling.  Gastrointestinal: Negative for abdominal pain, nausea and vomiting.  Genitourinary: Negative for flank pain.  Musculoskeletal: Positive for back pain, joint pain and neck pain. Negative for myalgias.  Skin: Negative for itching and rash.  Neurological: Positive for headaches. Negative for tremors, focal weakness and weakness.  Endo/Heme/Allergies: Negative.   Psychiatric/Behavioral: Negative for depression. The patient is nervous/anxious.   All other systems reviewed and are negative.  Otherwise per HPI.  Assessment & Plan: Visit Diagnoses:  1. Chronic bilateral low back pain with bilateral sciatica   2. Cervicalgia   3. Cervical disc disorder with radiculopathy   4. Myofascial pain syndrome     Plan: Findings:  Abrupt onset of acute back pain August 5 of this year without any really inciting trauma or problem.  This seemed to resolve and now there is really pain in multiple areas but specifically neck and shoulders and posterior arms but also bilateral legs posteriorly.  This is in the setting of known disc herniations of the cervical spine and lumbar spine is reviewed below.  She did get some resolution with prednisone and activity modification.  Her pain complaints however I think are modified to a degree by an underlying anxiety disorder and panic disorder.  She had vasovagal reactions with injections a few years ago and just today talking about the assessment and plan every time I tried to get into any reasoning and get a plan together with her she would really start to have issues with feeling hot and sweaty and nauseated like she was going to pass out  and we had to lie her on the exam table for a while.  In terms of her neck pain at this point I think it is mostly myofascial and I think she would do well with physical therapy with dry needling and manual treatment gently and I did make a prescription for her for [facility] physical therapy.  We also talked about the use of medications and I did prescribe gabapentin to try at night.  She is tried this before and there was some issue with it was thought maybe it was increasing her headaches but have never seen that and I do want to try because it also has some antianxiety properties with it as well.  She can also continue to take Robaxin as it is helped her.  Lastly I want to try a short course of prednisone since it seemed to help when her back pain  flared up.  If she is not getting much better after physical therapy and some medication trial and then we would look at cervical epidural injection versus reimaging of the cervical spine.  I would recommend triazolam or diazepam prior to the injection do to the anxiety and vasovagal type problems.  I think her underlying anxiety issues may be actually a factor in her pain complaints and quality of life.  This note should go back to her primary care physician I will try to make sure that happens.  Diagnosis and treatment should be undertaken for her anxiety disorder if it is present.  She asked me today to write a note to her employer for possibly having physical breaks from as much typing and I did write a note suggesting that she may benefit from taking a break from positioning and typing every 15 to 20 minutes for a very brief break.  There is no physical underlying reason right now though that she cannot type other than just pain complaints.    Meds & Orders:  Meds ordered this encounter  Medications   methocarbamol (ROBAXIN) 500 MG tablet    Sig: Take 1 tablet (500 mg total) by mouth every 8 (eight) hours as needed for muscle spasms.    Dispense:  60  tablet    Refill:  0   predniSONE (DELTASONE) 50 MG tablet    Sig: Take 1 tablet daily with food for 5 days until finished    Dispense:  5 tablet    Refill:  0   gabapentin (NEURONTIN) 600 MG tablet    Sig: Take 0.5 tablets (300 mg total) by mouth at bedtime.    Dispense:  30 tablet    Refill:  2   No orders of the defined types were placed in this encounter.   Follow-up: No follow-ups on file.   Procedures: No procedures performed  No notes on file   Clinical History: MRI LUMBAR SPINE WITHOUT CONTRAST  TECHNIQUE: Multiplanar, multisequence MR imaging of the lumbar spine was performed. No intravenous contrast was administered.  COMPARISON: Lumbar spine radiograph 05/27/2016  FINDINGS: Segmentation: Normal. The lowest disc space is considered to be L5-S1.  Alignment: Normal  Vertebrae: No acute compression fracture, facet edema or focal marrow lesion.  Conus medullaris: Extends to the L1 level and appears normal.  Paraspinal and other soft tissues: The visualized aorta, IVC and iliac vessels are normal. The visualized retroperitoneal organs and paraspinal soft tissues are normal.  Disc levels:  T12-L1: Evaluated on sagittal images only. No significant disc herniation, spinal canal stenosis or neuroforaminal narrowing.  L1-L2: Normal disc space and facet joints. No spinal canal stenosis. No neuroforaminal stenosis.  L2-L3: Normal disc space and facet joints. No spinal canal stenosis. No neuroforaminal stenosis.  L3-L4: Disc desiccation with small disc bulge and moderate facet hypertrophy. No spinal canal stenosis. Mild bilateral neuroforaminal stenosis.  L4-L5: Disc desiccation with small right subarticular/foraminal disc extrusion with minimal cranial migration and small annular fissure, in close proximity to the exiting right L4 nerve root. Moderate bilateral facet hypertrophy. No spinal canal stenosis. Mild right neuroforaminal  stenosis.  L5-S1: Small diffuse disc bulge and moderate facet hypertrophy. Degenerative spurring of the inferior L5 endplate. No spinal canal stenosis. Mild right and moderate left neuroforaminal stenosis.  IMPRESSION: 1. Multifactorial moderate left L5-S1 neural foraminal narrowing. This may be a source of left L5 radiculopathy. 2. Right subarticular/foraminal disc extrusion with cranial migration and small annular fissure at L4-L5, in close proximity  to the exiting right L4 nerve root.   Electronically Signed By: Ulyses Jarred M.D. On: 06/21/2016 23:13 ------- Cervical Spine MRI IMPRESSION: Straightened cervical lordosis without acute osseous process. No malalignment.  Multilevel cervical disc bulges/ protrusions resulting in mild canal stenosis at C4-5 and C6-7, minimal at C5-6. Mild to moderate C4-5 neural foraminal narrowing.  Small T1-2 central disc extrusion resulting in mild canal stenosis.   Electronically Signed   By: Elon Alas   On: 08/01/2014 03:33   She reports that she has never smoked. She has never used smokeless tobacco. No results for input(s): HGBA1C, LABURIC in the last 8760 hours.  Objective:  VS:  HT:     WT:    BMI:      BP:(!) 139/96   HR:(!) 138bpm   TEMP: ( )   RESP:  Physical Exam Vitals signs and nursing note reviewed.  Constitutional:      General: She is not in acute distress.    Appearance: Normal appearance. She is well-developed.  HENT:     Head: Normocephalic and atraumatic.     Nose: Nose normal.     Mouth/Throat:     Mouth: Mucous membranes are moist.     Pharynx: Oropharynx is clear.  Eyes:     Conjunctiva/sclera: Conjunctivae normal.     Pupils: Pupils are equal, round, and reactive to light.  Neck:     Musculoskeletal: Normal range of motion and neck supple.  Cardiovascular:     Rate and Rhythm: Normal rate and regular rhythm.     Pulses: Normal pulses.  Pulmonary:     Effort: Pulmonary effort is normal. No  respiratory distress.  Abdominal:     General: There is no distension.     Palpations: Abdomen is soft.     Tenderness: There is no guarding.  Musculoskeletal:     Right lower leg: Edema present.     Left lower leg: Edema present.     Comments: Examination of the cervical spine shows myofascial trigger points in the trapezius supraspinatus and rhomboid that does reproduce a lot of her pain.  She had no real shoulder impingement signs bilaterally.  She has good strength in the upper extremities bilaterally particularly with long finger flexion and wrist extension and finger abduction.  She has a negative Hoffmann's test bilaterally.  She had 2+ muscle stretch reflexes at the biceps and brachioradialis.  Examination of the lumbar spine show that the patient had difficulty arising from a seated position Duda pain and difficulty just with body habitus.  She did ambulate without aid.  She had good strength in the lower extremities with no pain with hip rotation.  She had a negative slump test bilaterally.  Skin:    General: Skin is warm and dry.     Findings: No erythema or rash.  Neurological:     General: No focal deficit present.     Mental Status: She is alert and oriented to person, place, and time.     Motor: No abnormal muscle tone.     Coordination: Coordination normal.     Gait: Gait normal.  Psychiatric:        Mood and Affect: Mood normal.        Thought Content: Thought content normal.     Comments: Observed vasovagal activity just during the interview and with talking about assessment and plan     Ortho Exam Imaging: No results found.  Past Medical/Family/Surgical/Social History: Medications & Allergies reviewed per  EMR, new medications updated. Patient Active Problem List   Diagnosis Date Noted   Asthmatic bronchitis with acute exacerbation 10/01/2013   Perianal abscess 07/02/2013   Perirectal abscess 06/14/2013   Unspecified asthma(493.90) 11/22/2011   Hypertension  07/25/2011   Tachycardia 07/25/2011   Obesity 07/25/2011   Dyspnea 07/25/2011   PCOS (polycystic ovarian syndrome) 04/11/2011   Migraines 04/11/2011   Past Medical History:  Diagnosis Date   Anxiety    Asthma    Bulging lumbar disc    Depression    Diabetes mellitus without complication ([facility])    GERD (gastroesophageal reflux disease)    Hypoglycemia    Migraines    Dr.Paul Martin in [place]   PCOS (polycystic ovarian syndrome)    [facility]   Shoulder disorder    Syncope    Family History  Problem Relation Age of Onset   Diabetes Mother    Arthritis Mother    Breast cancer Mother 33   Hypertension Mother    Hypertension Father    Diabetes Maternal Grandmother    Arthritis Maternal Grandmother    Diabetes Maternal Aunt    Hyperlipidemia Paternal Aunt    Hyperlipidemia Paternal Uncle    Stroke Paternal Uncle    Kidney disease Paternal Uncle    Depression Other    Breast cancer Other        maternal cousin   Heart disease Paternal Uncle 41       Sudden death   Migraines Sister    Past Surgical History:  Procedure Laterality Date   DENTAL SURGERY     None     Social History   Occupational History   Occupation: Clinical cytogeneticist for [employer]  Tobacco Use   Smoking status: Never Smoker   Smokeless tobacco: Never Used  Substance and Sexual Activity   Alcohol use: No    Alcohol/week: 0.0 standard drinks    Comment: social   Drug use: No   Sexual activity: Not on file

## 2019-05-11 NOTE — Progress Notes (Signed)
  Numeric Pain Rating Scale and Functional Assessment Average Pain 6 Pain Right Now 4 My pain is intermittent and aching Pain is worse with: walking and standing Pain improves with: rest and medication   In the last MONTH (on 0-10 scale) has pain interfered with the following?  1. General activity like being  able to carry out your everyday physical activities such as walking, climbing stairs, carrying groceries, or moving a chair?  Rating(9)  2. Relation with others like being able to carry out your usual social activities and roles such as  activities at home, at work and in your community. Rating(9)  3. Enjoyment of life such that you have  been bothered by emotional problems such as feeling anxious, depressed or irritable?  Rating(10)

## 2019-05-13 ENCOUNTER — Telehealth: Payer: Self-pay | Admitting: Podiatry

## 2019-05-13 NOTE — Telephone Encounter (Signed)
Patient called requesting a refill on her compound prescription. Please give patient a call once prescription has been sent.

## 2019-05-14 ENCOUNTER — Other Ambulatory Visit: Payer: Self-pay | Admitting: Gastroenterology

## 2019-05-14 MED ORDER — NONFORMULARY OR COMPOUNDED ITEM: 11 refills | Status: AC

## 2019-05-14 NOTE — Addendum Note (Signed)
Addended by: Harriett Sine D on: 05/14/2019 11:01 AM   Modules accepted: Orders

## 2019-05-14 NOTE — Telephone Encounter (Signed)
Dr. Trinidad Curet peripheral neuropathy cream from [facility] as alternate for the [employer] in [facility].

## 2019-05-14 NOTE — Telephone Encounter (Signed)
Orders faxed to [facility] 

## 2019-06-09 ENCOUNTER — Ambulatory Visit: Payer: PRIVATE HEALTH INSURANCE | Admitting: Physical Medicine and Rehabilitation

## 2019-07-13 ENCOUNTER — Other Ambulatory Visit: Payer: Self-pay | Admitting: Physical Medicine and Rehabilitation

## 2019-07-13 ENCOUNTER — Other Ambulatory Visit: Payer: Self-pay | Admitting: Family Medicine

## 2019-07-13 DIAGNOSIS — B354 Tinea corporis: Secondary | ICD-10-CM

## 2019-09-07 ENCOUNTER — Telehealth: Payer: Self-pay | Admitting: Family Medicine

## 2019-09-07 NOTE — Telephone Encounter (Signed)
Patient called and would like to speak with Dr, Etter Sjogren or her CMA about getting a refill for a allergy medication she was given by a [facility] doctor she had a visit with. Please call patient back, thanks.

## 2019-09-07 NOTE — Telephone Encounter (Signed)
Its otc

## 2019-09-07 NOTE — Telephone Encounter (Signed)
Spoke with patient and she would like a refill on Fexofenadine. Pt states a teleadoc prescribed originally and she would like a refill. Please advise

## 2019-09-22 ENCOUNTER — Other Ambulatory Visit: Payer: Self-pay

## 2019-09-23 ENCOUNTER — Ambulatory Visit (INDEPENDENT_AMBULATORY_CARE_PROVIDER_SITE_OTHER): Payer: PRIVATE HEALTH INSURANCE | Admitting: Family Medicine

## 2019-09-23 ENCOUNTER — Encounter: Payer: Self-pay | Admitting: Family Medicine

## 2019-09-23 VITALS — BP 118/80 | HR 136 | Temp 97.0°F | Resp 18 | Ht 64.0 in | Wt 254.0 lb

## 2019-09-23 DIAGNOSIS — B354 Tinea corporis: Secondary | ICD-10-CM | POA: Diagnosis not present

## 2019-09-23 DIAGNOSIS — H65191 Other acute nonsuppurative otitis media, right ear: Secondary | ICD-10-CM | POA: Diagnosis not present

## 2019-09-23 DIAGNOSIS — H60391 Other infective otitis externa, right ear: Secondary | ICD-10-CM | POA: Diagnosis not present

## 2019-09-23 MED ORDER — AMOXICILLIN-POT CLAVULANATE 875-125 MG PO TABS: 1.0000 | ORAL_TABLET | Freq: Two times a day (BID) | ORAL | 0 refills | Status: AC

## 2019-09-23 MED ORDER — NYSTATIN 100000 UNIT/GM EX POWD: CUTANEOUS | 0 refills | Status: AC

## 2019-09-23 MED ORDER — OFLOXACIN 0.3 % OT SOLN: 10.0000 [drp] | Freq: Every day | OTIC | 0 refills | Status: AC

## 2019-09-23 NOTE — Patient Instructions (Signed)
Otitis Externa  Otitis externa is an infection of the outer ear canal. The outer ear canal is the area between the outside of the ear and the eardrum. Otitis externa is sometimes called swimmer's ear. What are the causes? Common causes of this condition include:  Swimming in dirty water.  Moisture in the ear.  An injury to the inside of the ear.  An object stuck in the ear.  A cut or scrape on the outside of the ear. What increases the risk? You are more likely to develop this condition if you go swimming often. What are the signs or symptoms? The first symptom of this condition is often itching in the ear. Later symptoms of the condition include:  Swelling of the ear.  Redness in the ear.  Ear pain. The pain may get worse when you pull on your ear.  Pus coming from the ear. How is this diagnosed? This condition may be diagnosed by examining the ear and testing fluid from the ear for bacteria and funguses. How is this treated? This condition may be treated with:  Antibiotic ear drops. These are often given for 10-14 days.  Medicines to reduce itching and swelling. Follow these instructions at home:  If you were prescribed antibiotic ear drops, use them as told by your health care provider. Do not stop using the antibiotic even if your condition improves.  Take over-the-counter and prescription medicines only as told by your health care provider.  Avoid getting water in your ears as told by your health care provider. This may include avoiding swimming or water sports for a few days.  Keep all follow-up visits as told by your health care provider. This is important. How is this prevented?  Keep your ears dry. Use the corner of a towel to dry your ears after you swim or bathe.  Avoid scratching or putting things in your ear. Doing these things can damage the ear canal or remove the protective wax that lines it, which makes it easier for bacteria and funguses to  grow.  Avoid swimming in lakes, polluted water, or pools that may not have enough chlorine. Contact a health care provider if:  You have a fever.  Your ear is still red, swollen, painful, or draining pus after 3 days.  Your redness, swelling, or pain gets worse.  You have a severe headache.  You have redness, swelling, pain, or tenderness in the area behind your ear. Summary  Otitis externa is an infection of the outer ear canal.  Common causes include swimming in dirty water, moisture in the ear, or a cut or scrape in the ear.  Symptoms include pain, redness, and swelling of the ear.  If you were prescribed antibiotic ear drops, use them as told by your health care provider. Do not stop using the antibiotic even if your condition improves. This information is not intended to replace advice given to you by your health care provider. Make sure you discuss any questions you have with your health care provider. Document Revised: 02/06/2018 Document Reviewed: 02/06/2018 Elsevier Patient Education  2020 Elsevier Inc.  

## 2019-09-28 NOTE — Progress Notes (Signed)
Patient ID: Stacy Gentry, female    DOB: 26-Sep-1975  Age: 44 y.o. MRN: [id number]    Subjective:  Subjective  HPI Stacy Gentry presents for R ear pain    No other symptoms  No fever  No congestion   Review of Systems  Constitutional: Negative for activity change, appetite change, fatigue and unexpected weight change.  HENT: Positive for ear pain. Negative for ear discharge, sinus pressure, sinus pain, sneezing and sore throat.   Respiratory: Negative for cough and shortness of breath.   Cardiovascular: Negative for chest pain and palpitations.  Psychiatric/Behavioral: Negative for behavioral problems and dysphoric mood. The patient is not nervous/anxious.     History Past Medical History:  Diagnosis Date  . Anxiety   . Asthma   . Bulging lumbar disc   . Depression   . Diabetes mellitus without complication ([place])   . GERD (gastroesophageal reflux disease)   . Hypoglycemia   . Migraines    Dr.Paul Hassell Done in [place]  . PCOS (polycystic ovarian syndrome)    [facility]  . Shoulder disorder   . Syncope     She has a past surgical history that includes None and Dental surgery.   Her family history includes Arthritis in her maternal grandmother and mother; Breast cancer in an other family member; Breast cancer (age of onset: 30) in her mother; Depression in an other family member; Diabetes in her maternal aunt, maternal grandmother, and mother; Heart disease (age of onset: 33) in her paternal uncle; Hyperlipidemia in her paternal aunt and paternal uncle; Hypertension in her father and mother; Kidney disease in her paternal uncle; Migraines in her sister; Stroke in her paternal uncle.She reports that she has never smoked. She has never used smokeless tobacco. She reports that she does not drink alcohol or use drugs.  Current Outpatient Medications on File Prior to Visit  Medication Sig Dispense Refill  . diclofenac (VOLTAREN) 75 MG EC tablet Take 1 tablet (75 mg total)  by mouth 2 (two) times daily. 60 tablet 4  . diclofenac sodium (VOLTAREN) 1 % GEL Apply 2 g topically 2 (two) times daily as needed. 100 g 1  . gabapentin (NEURONTIN) 600 MG tablet Take 0.5 tablets (300 mg total) by mouth at bedtime. 30 tablet 2  . methocarbamol (ROBAXIN) 500 MG tablet TAKE 1 TABLET (500 MG TOTAL) BY MOUTH EVERY 8 (EIGHT) HOURS AS NEEDED FOR MUSCLE SPASMS. 60 tablet 0  . NONFORMULARY OR COMPOUNDED ITEM [facility]:  Peripheral neuropathy cream - Bupivacaine 1%, Doxepin 3%, Gabapentin 6%, Pentoxifylline 3^, Topiramate 1%, apply 1-2 grams to affected area 3-4 times a day. 100 each 11  . omeprazole (PRILOSEC) 40 MG capsule Take 1 capsule (40 mg total) by mouth daily. Please schedule a follow up visit for additional refills. Thank you 30 capsule 1  . SUMAtriptan (IMITREX) 50 MG tablet TAKE 1 TAB(S) ORALLY ONCE A DAY AND MAY REPEAT 2 HOURS LATER X ONCE IF NEEDED 9 tablet 3  . SUMAtriptan (IMITREX) 6 MG/0.5ML SOLN injection Inject 0.5 mLs (6 mg total) into the skin as directed for 1 dose. May repeat once after 2 hours if not effective. 9 mL 1   Current Facility-Administered Medications on File Prior to Visit  Medication Dose Route Frequency Provider Last Rate Last Admin  . betamethasone acetate-betamethasone sodium phosphate (CELESTONE) injection 3 mg  3 mg Intramuscular Once Daylene Katayama M, DPM      . betamethasone acetate-betamethasone sodium phosphate (CELESTONE) injection 3 mg  3 mg Intramuscular Once Edrick Kins, DPM         Objective:  Objective  Physical Exam Vitals and nursing note reviewed.  Constitutional:      Appearance: She is well-developed.  HENT:     Head: Normocephalic and atraumatic.     Right Ear: Tenderness present. A middle ear effusion is present. There is no impacted cerumen. Tympanic membrane is injected.     Left Ear: Tympanic membrane and ear canal normal.  Eyes:     Conjunctiva/sclera: Conjunctivae normal.  Neck:     Thyroid: No  thyromegaly.     Vascular: No carotid bruit or JVD.  Cardiovascular:     Rate and Rhythm: Normal rate and regular rhythm.     Heart sounds: Normal heart sounds. No murmur.  Pulmonary:     Effort: Pulmonary effort is normal. No respiratory distress.     Breath sounds: Normal breath sounds. No wheezing or rales.  Chest:     Chest wall: No tenderness.  Musculoskeletal:     Cervical back: Normal range of motion and neck supple.  Neurological:     Mental Status: She is alert and oriented to person, place, and time.    BP 118/80 (BP Location: Right Arm, Patient Position: Sitting, Cuff Size: Large)   Pulse (!) 136   Temp (!) 97 F (36.1 C) (Temporal)   Resp 18   Ht 5\' 4"  (1.626 m)   Wt 254 lb (115.2 kg)   SpO2 99%   BMI 43.60 kg/m  Wt Readings from Last 3 Encounters:  09/23/19 254 lb (115.2 kg)  09/21/18 251 lb 12.8 oz (114.2 kg)  06/04/18 250 lb 6.4 oz (113.6 kg)     Lab Results  Component Value Date   WBC 12.5 (H) 09/21/2018   HGB 11.3 (L) 09/21/2018   HCT 35.0 (L) 09/21/2018   PLT 404.0 (H) 09/21/2018   GLUCOSE 96 09/21/2018   CHOL 173 09/21/2018   TRIG 128.0 09/21/2018   HDL 56.90 09/21/2018   LDLCALC 90 09/21/2018   ALT 9 09/21/2018   AST 12 09/21/2018   NA 139 09/21/2018   K 3.9 09/21/2018   CL 102 09/21/2018   CREATININE 0.83 09/21/2018   BUN 7 09/21/2018   CO2 27 09/21/2018   TSH 0.76 09/21/2018   HGBA1C 5.8 04/11/2011    No results found.   Assessment & Plan:  Plan  I have discontinued Stacy Gentry's ondansetron and predniSONE. I am also having her start on amoxicillin-clavulanate and ofloxacin. Additionally, I am having her maintain her diclofenac sodium, diclofenac, SUMAtriptan, omeprazole, gabapentin, NONFORMULARY OR COMPOUNDED ITEM, SUMAtriptan, methocarbamol, and nystatin. We will continue to administer betamethasone acetate-betamethasone sodium phosphate and betamethasone acetate-betamethasone sodium phosphate.  Meds ordered this encounter    Medications  . nystatin (MYCOSTATIN/NYSTOP) powder    Sig: APPLY TO AFFECTED AREA 4 TIMES A DAY    Dispense:  15 g    Refill:  0  . amoxicillin-clavulanate (AUGMENTIN) 875-125 MG tablet    Sig: Take 1 tablet by mouth 2 (two) times daily.    Dispense:  20 tablet    Refill:  0  . ofloxacin (FLOXIN OTIC) 0.3 % OTIC solution    Sig: Place 10 drops into the right ear daily.    Dispense:  5 mL    Refill:  0    Problem List Items Addressed This Visit    None    Visit Diagnoses    Other non-recurrent acute nonsuppurative  otitis media of right ear    -  Primary   Relevant Medications   amoxicillin-clavulanate (AUGMENTIN) 875-125 MG tablet   Tinea corporis       this dx was connected to presciption refill. She gets fungal type infection under her breast by pt desciption. Refilled her nystatin powder.   Relevant Medications   nystatin (MYCOSTATIN/NYSTOP) powder   Other infective acute otitis externa of right ear       Relevant Medications   nystatin (MYCOSTATIN/NYSTOP) powder   amoxicillin-clavulanate (AUGMENTIN) 875-125 MG tablet   ofloxacin (FLOXIN OTIC) 0.3 % OTIC solution    it if con't to bother her-- refer to ent Follow-up: Return if symptoms worsen or fail to improve.  Ann Held, DO

## 2019-10-05 ENCOUNTER — Encounter: Payer: Self-pay | Admitting: Family Medicine

## 2019-10-05 ENCOUNTER — Other Ambulatory Visit: Payer: Self-pay | Admitting: Family Medicine

## 2019-10-05 DIAGNOSIS — H60509 Unspecified acute noninfective otitis externa, unspecified ear: Secondary | ICD-10-CM

## 2019-10-05 DIAGNOSIS — N760 Acute vaginitis: Secondary | ICD-10-CM

## 2019-10-05 MED ORDER — ACETIC ACID 2 % OT SOLN: 4.0000 [drp] | OTIC | 0 refills | Status: AC

## 2019-10-05 MED ORDER — FLUCONAZOLE 150 MG PO TABS: ORAL_TABLET | ORAL | 0 refills | Status: AC

## 2019-10-05 NOTE — Telephone Encounter (Signed)
Diflucan sent in Drops sent in as well  Refer to ent if no better

## 2019-10-11 ENCOUNTER — Telehealth: Payer: Self-pay | Admitting: Family Medicine

## 2019-10-11 NOTE — Telephone Encounter (Signed)
Pt called in. States that her Ear Pain has not improved and would like for Dr Etter Sjogren to go ahead a refer to the ENT.

## 2019-10-12 ENCOUNTER — Other Ambulatory Visit: Payer: Self-pay | Admitting: Family Medicine

## 2019-10-12 DIAGNOSIS — H9209 Otalgia, unspecified ear: Secondary | ICD-10-CM

## 2019-10-12 NOTE — Telephone Encounter (Signed)
Any ENT you would prefer? Please advise

## 2020-03-02 ENCOUNTER — Other Ambulatory Visit: Payer: Self-pay | Admitting: Family Medicine

## 2020-07-04 ENCOUNTER — Telehealth: Payer: Self-pay

## 2020-07-04 NOTE — Telephone Encounter (Signed)
Patient last seen by Dr Ernestina Patches. Can you see if he will advise? Not sure that he will as it has been quite sometime since she was seen.

## 2020-07-04 NOTE — Telephone Encounter (Signed)
FYI- I called to advise per Dr. Romona Curls note below. Patient states that Dr. Marlou Sa originally wrote a note for her previous job, but she needs a new one for her current job. I advised that she would need to be reevaluated at her upcoming appointment with Lurena Joiner since she has not been seen by anyone in our office since 2020.

## 2020-07-04 NOTE — Telephone Encounter (Signed)
From last note:  "She asked me today to write a note to her employer for possibly having physical breaks from as much typing and I did write a note suggesting that she may benefit from taking a break from positioning and typing every 15 to 20 minutes for a very brief break.  There is no physical underlying reason right now though that she cannot type other than just pain complaints."  I do not have any weight  or lifting restrictions for her.

## 2020-07-04 NOTE — Telephone Encounter (Signed)
Patient needs note by the end of the week.

## 2020-07-04 NOTE — Telephone Encounter (Signed)
See other note for further documentation

## 2020-07-04 NOTE — Telephone Encounter (Signed)
paient came in she is requesting a note about her weight restrictions for her job. She would like the note to be emailed to her. Email:[email or web address] Call [phone]

## 2020-07-04 NOTE — Telephone Encounter (Signed)
Patient was last seen for an OV on 05/11/2019. Please advise.

## 2020-07-07 ENCOUNTER — Other Ambulatory Visit: Payer: Self-pay

## 2020-07-07 ENCOUNTER — Encounter: Payer: Self-pay | Admitting: Surgical

## 2020-07-07 ENCOUNTER — Ambulatory Visit (INDEPENDENT_AMBULATORY_CARE_PROVIDER_SITE_OTHER): Payer: PRIVATE HEALTH INSURANCE | Admitting: Surgical

## 2020-07-07 DIAGNOSIS — Z8739 Personal history of other diseases of the musculoskeletal system and connective tissue: Secondary | ICD-10-CM | POA: Diagnosis not present

## 2020-07-09 ENCOUNTER — Encounter: Payer: Self-pay | Admitting: Surgical

## 2020-07-09 NOTE — Progress Notes (Signed)
Office Visit Note   Patient: Stacy Gentry           Date of Birth: 04/22/1976           MRN: [id number] Visit Date: 07/07/2020 Requested by: [address] [place], [address] [place],  [place] [address] PCP: Carollee Herter, Alferd Apa, DO  Subjective: Chief Complaint  Patient presents with  . Neck - Follow-up    HPI: Stacy Gentry is a 44 y.o. female who presents to the office for evaluation of back pain.  She was last seen by Dr. Ernestina Patches on 05/11/2019.  She notes that she is starting a new job with [employer] next week and she needs a letter with her work restrictions.  She previously has had difficulty with controlling her low back pain.  Last MRI of the lumbar spine was in October 2017 that revealed moderate left L5-S1 neuroforaminal narrowing as well as subarticular foraminal disc extrusion on the right side at L4-L5.  She notes that overall her back pain is controlled at this point.  She had physical therapy in 2020 with dry needling that provided good relief.  No recent epidural steroid injections.  She is compliant with a home exercise program that has helped control her back pain.  She takes diclofenac as well as Robaxin for symptomatic control.  She denies any weakness in her legs or radicular pain down her legs..                ROS: All systems reviewed are negative as they relate to the chief complaint within the history of present illness.  Patient denies fevers or chills.  Assessment & Plan: Visit Diagnoses:  1. History of back pain     Plan: Patient is a 44 year old female presents for evaluation of her back pain.  Her back pain is well controlled and she is just requesting a work note detailing her restrictions.  Work note was provided.  She has no tenderness, radicular pain, weakness, sensation changes on exam today.  She is controlling her symptoms with home exercise program.  Plan to continue with home exercise program and follow-up as needed.   Patient agreed with plan.  Follow-Up Instructions: No follow-ups on file.   Orders:  No orders of the defined types were placed in this encounter.  No orders of the defined types were placed in this encounter.     Procedures: No procedures performed   Clinical Data: No additional findings.  Objective: Vital Signs: There were no vitals taken for this visit.  Physical Exam:  Constitutional: Patient appears well-developed HEENT:  Head: Normocephalic Eyes:EOM are normal Neck: Normal range of motion Cardiovascular: Normal rate Pulmonary/chest: Effort normal Neurologic: Patient is alert Skin: Skin is warm Psychiatric: Patient has normal mood and affect  Ortho Exam: Ortho exam demonstrates no tenderness throughout the axial lumbar spine or paraspinal musculature.  No evidence of clonus.  Sensation intact through all dermatomes of bilateral lower extremities.  5/5 motor strength of the bilateral hip flexors, quadricep, hamstring, dorsiflexion, plantar flexion.  Negative straight leg raise.   Specialty Comments:  No specialty comments available.  Imaging: No results found.   PMFS History: Patient Active Problem List   Diagnosis Date Noted  . Asthmatic bronchitis with acute exacerbation 10/01/2013  . Perianal abscess 07/02/2013  . Perirectal abscess 06/14/2013  . Unspecified asthma(493.90) 11/22/2011  . Hypertension 07/25/2011  . Tachycardia 07/25/2011  . Obesity 07/25/2011  . Dyspnea 07/25/2011  .  PCOS (polycystic ovarian syndrome) 04/11/2011  . Migraines 04/11/2011   Past Medical History:  Diagnosis Date  . Anxiety   . Asthma   . Bulging lumbar disc   . Depression   . Diabetes mellitus without complication ([place])   . GERD (gastroesophageal reflux disease)   . Hypoglycemia   . Migraines    Dr.Paul Hassell Done in [place]  . PCOS (polycystic ovarian syndrome)    [facility]  . Shoulder disorder   . Syncope     Family History  Problem Relation Age of Onset  .  Diabetes Mother   . Arthritis Mother   . Breast cancer Mother 21  . Hypertension Mother   . Hypertension Father   . Diabetes Maternal Grandmother   . Arthritis Maternal Grandmother   . Diabetes Maternal Aunt   . Hyperlipidemia Paternal Aunt   . Hyperlipidemia Paternal Uncle   . Stroke Paternal Uncle   . Kidney disease Paternal Uncle   . Depression Other   . Breast cancer Other        maternal cousin  . Heart disease Paternal Uncle 31       Sudden death  . Migraines Sister     Past Surgical History:  Procedure Laterality Date  . DENTAL SURGERY    . None     Social History   Occupational History  . Occupation: Clinical cytogeneticist for [employer]: [employer]  Tobacco Use  . Smoking status: Never Smoker  . Smokeless tobacco: Never Used  Substance and Sexual Activity  . Alcohol use: No    Alcohol/week: 0.0 standard drinks    Comment: social  . Drug use: No  . Sexual activity: Not on file

## 2020-07-26 ENCOUNTER — Other Ambulatory Visit: Payer: Self-pay | Admitting: Family Medicine

## 2020-10-27 ENCOUNTER — Ambulatory Visit: Payer: PRIVATE HEALTH INSURANCE | Admitting: Family Medicine

## 2020-11-07 ENCOUNTER — Ambulatory Visit: Payer: BLUE CROSS/BLUE SHIELD | Admitting: Medical

## 2020-11-08 ENCOUNTER — Ambulatory Visit: Payer: Self-pay | Admitting: Medical

## 2020-11-22 ENCOUNTER — Other Ambulatory Visit: Payer: Self-pay

## 2020-11-22 ENCOUNTER — Ambulatory Visit (HOSPITAL_COMMUNITY)
Admission: EM | Admit: 2020-11-22 | Discharge: 2020-11-22 | Disposition: A | Discharge status: Home / Self Care | Payer: PRIVATE HEALTH INSURANCE | Attending: Urgent Care | Admitting: Urgent Care

## 2020-11-22 ENCOUNTER — Encounter (HOSPITAL_COMMUNITY): Payer: Self-pay

## 2020-11-22 DIAGNOSIS — H6981 Other specified disorders of Eustachian tube, right ear: Secondary | ICD-10-CM

## 2020-11-22 DIAGNOSIS — H938X1 Other specified disorders of right ear: Secondary | ICD-10-CM

## 2020-11-22 DIAGNOSIS — R000 Tachycardia, unspecified: Secondary | ICD-10-CM

## 2020-11-22 MED ORDER — CETIRIZINE HCL 10 MG PO TABS: 10.0000 mg | ORAL_TABLET | Freq: Every day | ORAL | 0 refills | Status: AC

## 2020-11-22 MED ORDER — FLUTICASONE PROPIONATE 50 MCG/ACT NA SUSP: 2.0000 | Freq: Every day | NASAL | 0 refills | Status: AC

## 2020-11-22 NOTE — ED Provider Notes (Signed)
[place]   MRN: [id number] DOB: 04-03-1976  Subjective:   Stacy Gentry is a 45 y.o. female presenting for 2-day history of acute onset right ear fullness.  Denies fever, tinnitus, dizziness, ear drainage, ear pain, runny or stuffy nose, cough, sore throat.  Regarding her pulse, patient states that she maintains a very high pulse, has had this for years.  Has previously had work-up done with a cardiologist without any particular findings.  Has not followed up with them recently.  Denies any chest pain, shortness of breath.  She also states that she is very anxious at the moment as she is still has difficulty in clinical settings from having lost a family member.   Current Facility-Administered Medications:  .  betamethasone acetate-betamethasone sodium phosphate (CELESTONE) injection 3 mg, 3 mg, Intramuscular, Once, Evans, Brent M, DPM .  betamethasone acetate-betamethasone sodium phosphate (CELESTONE) injection 3 mg, 3 mg, Intramuscular, Once, Edrick Kins, DPM  Current Outpatient Medications:  .  acetic acid 2 % otic solution, Place 4 drops into both ears every 3 (three) hours., Disp: 15 mL, Rfl: 0 .  amoxicillin-clavulanate (AUGMENTIN) 875-125 MG tablet, Take 1 tablet by mouth 2 (two) times daily., Disp: 20 tablet, Rfl: 0 .  diclofenac (VOLTAREN) 75 MG EC tablet, Take 1 tablet (75 mg total) by mouth 2 (two) times daily., Disp: 60 tablet, Rfl: 4 .  diclofenac sodium (VOLTAREN) 1 % GEL, Apply 2 g topically 2 (two) times daily as needed., Disp: 100 g, Rfl: 1 .  fluconazole (DIFLUCAN) 150 MG tablet, 1 po x1, may repeat in 3 days prn, Disp: 2 tablet, Rfl: 0 .  gabapentin (NEURONTIN) 600 MG tablet, Take 0.5 tablets (300 mg total) by mouth at bedtime., Disp: 30 tablet, Rfl: 2 .  methocarbamol (ROBAXIN) 500 MG tablet, TAKE 1 TABLET (500 MG TOTAL) BY MOUTH EVERY 8 (EIGHT) HOURS AS NEEDED FOR MUSCLE SPASMS., Disp: 60 tablet, Rfl: 0 .  NONFORMULARY OR COMPOUNDED ITEM,  [facility]:  Peripheral neuropathy cream - Bupivacaine 1%, Doxepin 3%, Gabapentin 6%, Pentoxifylline 3^, Topiramate 1%, apply 1-2 grams to affected area 3-4 times a day., Disp: 100 each, Rfl: 11 .  nystatin (MYCOSTATIN/NYSTOP) powder, APPLY TO AFFECTED AREA 4 TIMES A DAY, Disp: 15 g, Rfl: 0 .  ofloxacin (FLOXIN OTIC) 0.3 % OTIC solution, Place 10 drops into the right ear daily., Disp: 5 mL, Rfl: 0 .  omeprazole (PRILOSEC) 40 MG capsule, Take 1 capsule (40 mg total) by mouth daily. Please schedule a follow up visit for additional refills. Thank you, Disp: 30 capsule, Rfl: 1 .  SUMAtriptan (IMITREX) 50 MG tablet, TAKE 1 TAB(S) ORALLY ONCE A DAY AND MAY REPEAT 2 HOURS LATER X ONCE IF NEEDED, Disp: 9 tablet, Rfl: 3 .  SUMAtriptan (IMITREX) 6 MG/0.5ML SOLN injection, Inject 0.5 mLs (6 mg total) into the skin as directed for 1 dose. May repeat once after 2 hours if not effective., Disp: 9 mL, Rfl: 1   Allergies  Allergen Reactions  . Codeine Hives, Shortness Of Breath and Itching  . Flagyl [Metronidazole] Nausea Only  . Hydrocodone Itching  . Oxycodone Itching  . Tussionex Pennkinetic Er [Hydrocod Polst-Cpm Polst Er]     Past Medical History:  Diagnosis Date  . Anxiety   . Asthma   . Bulging lumbar disc   . Depression   . Diabetes mellitus without complication ([place])   . GERD (gastroesophageal reflux disease)   . Hypoglycemia   . Migraines  Dr.Paul Martin in [place]  . PCOS (polycystic ovarian syndrome)    [facility]  . Shoulder disorder   . Syncope      Past Surgical History:  Procedure Laterality Date  . DENTAL SURGERY    . None      Family History  Problem Relation Age of Onset  . Diabetes Mother   . Arthritis Mother   . Breast cancer Mother 16  . Hypertension Mother   . Hypertension Father   . Diabetes Maternal Grandmother   . Arthritis Maternal Grandmother   . Diabetes Maternal Aunt   . Hyperlipidemia Paternal Aunt   . Hyperlipidemia Paternal Uncle   .  Stroke Paternal Uncle   . Kidney disease Paternal Uncle   . Depression Other   . Breast cancer Other        maternal cousin  . Heart disease Paternal Uncle 22       Sudden death  . Migraines Sister     Social History   Tobacco Use  . Smoking status: Never Smoker  . Smokeless tobacco: Never Used  Substance Use Topics  . Alcohol use: No    Alcohol/week: 0.0 standard drinks    Comment: social  . Drug use: No    ROS   Objective:   Vitals: BP 134/90 (BP Location: Right Wrist)   Pulse (!) 121 Comment: Pt reporst she is having a panic attack  Temp 99.2 F (37.3 C) (Oral)   Resp (!) 22   LMP 11/13/2020 (Exact Date)   SpO2 96%   Wt Readings from Last 3 Encounters:  09/23/19 254 lb (115.2 kg)  09/21/18 251 lb 12.8 oz (114.2 kg)  06/04/18 250 lb 6.4 oz (113.6 kg)   Temp Readings from Last 3 Encounters:  11/22/20 99.2 F (37.3 C) (Oral)  09/23/19 (!) 97 F (36.1 C) (Temporal)  09/21/18 98.1 F (36.7 C) (Oral)   BP Readings from Last 3 Encounters:  11/22/20 134/90  09/23/19 118/80  05/11/19 (!) 139/96   Pulse Readings from Last 3 Encounters:  11/22/20 (!) 121  09/23/19 (!) 136  05/11/19 (!) 138   Physical Exam Constitutional:      General: She is not in acute distress.    Appearance: Normal appearance. She is well-developed. She is not ill-appearing, toxic-appearing or diaphoretic.  HENT:     Head: Normocephalic and atraumatic.     Right Ear: Tympanic membrane, ear canal and external ear normal. No drainage or tenderness. No middle ear effusion. There is no impacted cerumen. Tympanic membrane is not erythematous.     Left Ear: Tympanic membrane, ear canal and external ear normal. No drainage or tenderness.  No middle ear effusion. There is no impacted cerumen. Tympanic membrane is not erythematous.     Nose: Nose normal. No congestion or rhinorrhea.     Mouth/Throat:     Mouth: Mucous membranes are moist. No oral lesions.     Pharynx: No pharyngeal swelling,  oropharyngeal exudate, posterior oropharyngeal erythema or uvula swelling.     Tonsils: No tonsillar exudate or tonsillar abscesses.  Eyes:     General: No scleral icterus.       Right eye: No discharge.        Left eye: No discharge.     Extraocular Movements: Extraocular movements intact.     Right eye: Normal extraocular motion.     Left eye: Normal extraocular motion.     Conjunctiva/sclera: Conjunctivae normal.     Pupils: Pupils are  equal, round, and reactive to light.  Cardiovascular:     Rate and Rhythm: Normal rate.     Heart sounds: No murmur heard. No friction rub. No gallop.   Pulmonary:     Effort: Pulmonary effort is normal. No respiratory distress.     Breath sounds: No stridor. No wheezing, rhonchi or rales.  Chest:     Chest wall: No tenderness.  Musculoskeletal:     Cervical back: Normal range of motion and neck supple.  Lymphadenopathy:     Cervical: No cervical adenopathy.  Skin:    General: Skin is warm and dry.  Neurological:     General: No focal deficit present.     Mental Status: She is alert and oriented to person, place, and time.  Psychiatric:        Mood and Affect: Mood normal.        Behavior: Behavior normal.        Thought Content: Thought content normal.        Judgment: Judgment normal.      Assessment and Plan :   PDMP not reviewed this encounter.  1. Ear fullness, right   2. Eustachian tube dysfunction, right   3. Chronic tachycardia     We will manage conservatively for ETD with Flonase, Zyrtec, fluids.  Recommended follow-up with ENT if symptoms persist.  Clear cardiopulmonary exam.  Patient has chronic tachycardia and emphasized need for follow-up with her cardiologist.  Recommended avoiding any decongestants for her symptoms.  Counseled patient on potential for adverse effects with medications prescribed/recommended today, ER and return-to-clinic precautions discussed, patient verbalized understanding.    Jaynee Eagles,  PA-C 11/22/20 1513

## 2020-11-22 NOTE — ED Triage Notes (Signed)
Pt reports fullness sensation in right ear x 2 days. Denies pain, drainage, chills, fever.
